# Patient Record
Sex: Male | Born: 1940 | Race: White | Hispanic: No | Smoking: Former smoker
Health system: Southern US, Community
[De-identification: ages and names within clinical notes are randomized; demographics above are authoritative.]

## PROBLEM LIST (undated history)

## (undated) DIAGNOSIS — R519 Headache, unspecified: Secondary | ICD-10-CM

## (undated) DIAGNOSIS — I509 Heart failure, unspecified: Secondary | ICD-10-CM

## (undated) DIAGNOSIS — R011 Cardiac murmur, unspecified: Secondary | ICD-10-CM

## (undated) DIAGNOSIS — E785 Hyperlipidemia, unspecified: Secondary | ICD-10-CM

## (undated) DIAGNOSIS — R062 Wheezing: Secondary | ICD-10-CM

## (undated) DIAGNOSIS — J40 Bronchitis, not specified as acute or chronic: Secondary | ICD-10-CM

## (undated) DIAGNOSIS — I10 Essential (primary) hypertension: Secondary | ICD-10-CM

## (undated) DIAGNOSIS — G4730 Sleep apnea, unspecified: Secondary | ICD-10-CM

## (undated) DIAGNOSIS — J449 Chronic obstructive pulmonary disease, unspecified: Secondary | ICD-10-CM

## (undated) DIAGNOSIS — I2510 Atherosclerotic heart disease of native coronary artery without angina pectoris: Secondary | ICD-10-CM

## (undated) DIAGNOSIS — I219 Acute myocardial infarction, unspecified: Secondary | ICD-10-CM

## (undated) DIAGNOSIS — Z8711 Personal history of peptic ulcer disease: Secondary | ICD-10-CM

## (undated) DIAGNOSIS — R51 Headache: Secondary | ICD-10-CM

## (undated) DIAGNOSIS — H259 Unspecified age-related cataract: Secondary | ICD-10-CM

## (undated) DIAGNOSIS — IMO0001 Reserved for inherently not codable concepts without codable children: Secondary | ICD-10-CM

## (undated) DIAGNOSIS — R0601 Orthopnea: Secondary | ICD-10-CM

## (undated) DIAGNOSIS — J189 Pneumonia, unspecified organism: Secondary | ICD-10-CM

## (undated) DIAGNOSIS — J984 Other disorders of lung: Secondary | ICD-10-CM

## (undated) DIAGNOSIS — K269 Duodenal ulcer, unspecified as acute or chronic, without hemorrhage or perforation: Secondary | ICD-10-CM

## (undated) DIAGNOSIS — I350 Nonrheumatic aortic (valve) stenosis: Secondary | ICD-10-CM

## (undated) DIAGNOSIS — R600 Localized edema: Secondary | ICD-10-CM

## (undated) DIAGNOSIS — M069 Rheumatoid arthritis, unspecified: Secondary | ICD-10-CM

## (undated) DIAGNOSIS — N400 Enlarged prostate without lower urinary tract symptoms: Secondary | ICD-10-CM

## (undated) HISTORY — DX: Hyperlipidemia, unspecified: E78.5

## (undated) HISTORY — DX: Pneumonia, unspecified organism: J18.9

## (undated) HISTORY — PX: LUNG SURGERY: SHX703

## (undated) HISTORY — PX: CORONARY ANGIOPLASTY WITH STENT PLACEMENT: SHX49

## (undated) HISTORY — DX: Other disorders of lung: J98.4

## (undated) HISTORY — DX: Heart failure, unspecified: I50.9

## (undated) HISTORY — DX: Rheumatoid arthritis, unspecified: M06.9

## (undated) HISTORY — DX: Chronic obstructive pulmonary disease, unspecified: J44.9

## (undated) HISTORY — DX: Atherosclerotic heart disease of native coronary artery without angina pectoris: I25.10

## (undated) HISTORY — DX: Duodenal ulcer, unspecified as acute or chronic, without hemorrhage or perforation: K26.9

## (undated) HISTORY — DX: Benign prostatic hyperplasia without lower urinary tract symptoms: N40.0

---

## 2007-07-21 ENCOUNTER — Ambulatory Visit: Payer: Self-pay | Admitting: Rheumatology

## 2008-10-07 ENCOUNTER — Ambulatory Visit: Payer: Self-pay | Admitting: Specialist

## 2008-12-13 ENCOUNTER — Inpatient Hospital Stay: Payer: Self-pay | Admitting: Internal Medicine

## 2009-04-02 ENCOUNTER — Ambulatory Visit: Payer: Self-pay | Admitting: Specialist

## 2009-07-17 ENCOUNTER — Ambulatory Visit: Payer: Self-pay | Admitting: Unknown Physician Specialty

## 2009-09-30 ENCOUNTER — Ambulatory Visit: Payer: Self-pay | Admitting: Internal Medicine

## 2009-10-07 ENCOUNTER — Ambulatory Visit: Payer: Self-pay | Admitting: Internal Medicine

## 2010-02-17 ENCOUNTER — Ambulatory Visit: Payer: Self-pay | Admitting: Internal Medicine

## 2010-04-27 ENCOUNTER — Ambulatory Visit: Payer: Self-pay | Admitting: Internal Medicine

## 2010-07-30 ENCOUNTER — Encounter: Payer: Self-pay | Admitting: Internal Medicine

## 2010-08-14 ENCOUNTER — Encounter: Payer: Self-pay | Admitting: Internal Medicine

## 2010-09-13 ENCOUNTER — Encounter: Payer: Self-pay | Admitting: Internal Medicine

## 2010-10-14 ENCOUNTER — Encounter: Payer: Self-pay | Admitting: Internal Medicine

## 2010-11-17 ENCOUNTER — Encounter: Payer: Self-pay | Admitting: Internal Medicine

## 2010-12-14 ENCOUNTER — Encounter: Payer: Self-pay | Admitting: Internal Medicine

## 2011-01-14 ENCOUNTER — Encounter: Payer: Self-pay | Admitting: Internal Medicine

## 2011-01-22 ENCOUNTER — Ambulatory Visit: Payer: Self-pay | Admitting: Internal Medicine

## 2011-02-13 ENCOUNTER — Encounter: Payer: Self-pay | Admitting: Internal Medicine

## 2012-09-18 ENCOUNTER — Ambulatory Visit: Payer: Self-pay | Admitting: Unknown Physician Specialty

## 2012-12-08 ENCOUNTER — Encounter: Payer: Self-pay | Admitting: Specialist

## 2012-12-13 ENCOUNTER — Encounter: Payer: Self-pay | Admitting: Specialist

## 2013-01-13 ENCOUNTER — Encounter: Payer: Self-pay | Admitting: Specialist

## 2013-02-12 ENCOUNTER — Encounter: Payer: Self-pay | Admitting: Specialist

## 2013-02-12 ENCOUNTER — Emergency Department: Payer: Self-pay | Admitting: Emergency Medicine

## 2013-02-12 LAB — DIFFERENTIAL
Basophil #: 0.1 10*3/uL (ref 0.0–0.1)
Eosinophil #: 0.1 10*3/uL (ref 0.0–0.7)
Eosinophil %: 0.3 %
Lymphocyte #: 1 10*3/uL (ref 1.0–3.6)
Monocyte #: 1.5 x10 3/mm — ABNORMAL HIGH (ref 0.2–1.0)
Monocyte %: 7.9 %
Neutrophil #: 16.8 10*3/uL — ABNORMAL HIGH (ref 1.4–6.5)
Neutrophil %: 86.5 %

## 2013-02-12 LAB — URINALYSIS, COMPLETE
Bilirubin,UR: NEGATIVE
Nitrite: NEGATIVE
Protein: 100
RBC,UR: 29 /HPF (ref 0–5)
Specific Gravity: 1.03 (ref 1.003–1.030)
Squamous Epithelial: NONE SEEN

## 2013-02-12 LAB — RAPID INFLUENZA A&B ANTIGENS

## 2013-02-12 LAB — APTT: Activated PTT: 33.7 secs (ref 23.6–35.9)

## 2013-02-12 LAB — BASIC METABOLIC PANEL
EGFR (African American): >60
Glucose: 132 mg/dL — ABNORMAL HIGH (ref 65–99)
Osmolality: 272 (ref 275–301)
Potassium: 4 mmol/L (ref 3.5–5.1)
Sodium: 134 mmol/L — ABNORMAL LOW (ref 136–145)

## 2013-02-12 LAB — CBC
HCT: 44.3 % (ref 40.0–52.0)
HGB: 14.2 g/dL (ref 13.0–18.0)
MCH: 24.2 pg — ABNORMAL LOW (ref 26.0–34.0)
MCV: 76 fL — ABNORMAL LOW (ref 80–100)
RBC: 5.87 10*6/uL (ref 4.40–5.90)
WBC: 19.4 10*3/uL — ABNORMAL HIGH (ref 3.8–10.6)

## 2013-02-12 LAB — TROPONIN I: Troponin-I: <0.02 ng/mL

## 2013-02-14 LAB — URINE CULTURE

## 2013-02-17 LAB — CULTURE, BLOOD (SINGLE)

## 2013-03-15 ENCOUNTER — Encounter: Payer: Self-pay | Admitting: Specialist

## 2013-04-15 ENCOUNTER — Encounter: Payer: Self-pay | Admitting: Specialist

## 2013-08-20 DIAGNOSIS — J449 Chronic obstructive pulmonary disease, unspecified: Secondary | ICD-10-CM | POA: Insufficient documentation

## 2013-08-25 DIAGNOSIS — M069 Rheumatoid arthritis, unspecified: Secondary | ICD-10-CM | POA: Insufficient documentation

## 2013-08-25 DIAGNOSIS — IMO0001 Reserved for inherently not codable concepts without codable children: Secondary | ICD-10-CM | POA: Insufficient documentation

## 2013-08-25 DIAGNOSIS — R609 Edema, unspecified: Secondary | ICD-10-CM | POA: Insufficient documentation

## 2014-05-02 DIAGNOSIS — J189 Pneumonia, unspecified organism: Secondary | ICD-10-CM

## 2014-05-02 HISTORY — DX: Pneumonia, unspecified organism: J18.9

## 2014-06-17 ENCOUNTER — Other Ambulatory Visit: Payer: Self-pay | Admitting: *Deleted

## 2014-06-17 ENCOUNTER — Encounter: Payer: Self-pay | Admitting: *Deleted

## 2014-06-17 NOTE — Patient Outreach (Signed)
[facility]) Care Management   06/17/2014  Andre Hayes Jul 25, 1940 [id number]  Andre Hayes is an 74 y.o. male  Subjective: Pt states his main problem is being able to rest at night which causes him to be extremely tired during the day. He states he feels this is mainly from having to get up to void several times at night, but admits he has not been using C-PAP as directed.  Pt states he has not started an exercise program because of not getting enough rest, the pollen effecting his breathing and he continues to get SOB from just walking from one end of the house to the other.  Pt states it has been awhile since he saw his pulmonologist. Pt stated although his O2 saturation was low he did not feel sick or that he needed to call the dr. He stated he was not more short of breath than normal.   Objective:   Filed Vitals:   06/17/14 1057  BP: 116/66  Pulse: 87  Resp: 20  Pulse oximetry on room air is 85% at rest pt stated he felt well with no increased SOB or unusual amount of cough. Does not use home O2. Review of Systems  Respiratory: Positive for cough and shortness of breath.   Genitourinary: Positive for frequency.  All other systems reviewed and are negative.   Physical Exam  Constitutional: He is oriented to person, place, and time. He appears well-developed and well-nourished.  Cardiovascular: Normal rate and regular rhythm.   Respiratory: Effort normal. He has rhonchi in the left middle field and the left lower field.  GI: Soft. Bowel sounds are normal.  Neurological: He is alert and oriented to person, place, and time.  Skin: Skin is warm and dry.  Psychiatric: He has a normal mood and affect.    Current Medications:   Current Outpatient Prescriptions  Medication Sig Dispense Refill  . albuterol (PROVENTIL HFA;VENTOLIN HFA) 108 (90 BASE) MCG/ACT inhaler Inhale 2 puffs into the lungs every 4 (four) hours as needed for wheezing or shortness of breath  (pt states he takes daily).    Marland Kitchen aspirin 325 MG tablet Take 325 mg by mouth daily.    . clopidogrel (PLAVIX) 75 MG tablet Take 75 mg by mouth daily.    Marland Kitchen docusate sodium (COLACE) 100 MG capsule Take 100 mg by mouth daily.    . folic acid (FOLVITE) 400 MCG tablet Take 400 mcg by mouth daily.    . furosemide (LASIX) 10 MG/ML solution Take 10 mg by mouth daily.    . hydroxychloroquine (PLAQUENIL) 200 MG tablet Take 200 mg by mouth daily.    Marland Kitchen ipratropium-albuterol (DUONEB) 0.5-2.5 (3) MG/3ML SOLN Take 3 mLs by nebulization every 4 (four) hours as needed (takes once a day).    . simvastatin (ZOCOR) 40 MG tablet Take 40 mg by mouth daily.    . tamsulosin (FLOMAX) 0.4 MG CAPS capsule Take 0.4 mg by mouth daily. Pt is to take 3 capsules daily    . UNABLE TO FIND Take 1 Dose by nebulization daily. Med Name: [employer]- pt is in a clinical trial of this medication for COPD     No current facility-administered medications for this visit.    Functional Status:   In your present state of health, do you have any difficulty performing the following activities: 06/17/2014  Is the patient deaf or have difficulty hearing? N  Hearing N  Vision N  Difficulty concentrating or making  decisions N  Walking or climbing stairs? N  Doing errands, shopping? N  Preparing Food and eating ? N  Using the Toilet? N  In the past six months, have you accidently leaked urine? N  Do you have problems with loss of bowel control? N  Managing your Medications? N  Managing your Finances? N  Housekeeping or managing your Housekeeping? N    Fall/Depression Screening:    PHQ 2/9 Scores 06/17/2014  PHQ - 2 Score 0  Exception Documentation Other- indicate reason in comment box  Not completed pt states no feelings of depression   THN CM Care Plan        Patient Outreach from 06/17/2014 in [facility]   Care Plan Problem One  Activity intolerance related to COPD   Care Plan for Problem One  Active    Interventions for Problem One Long Term Goal  Discussed the importance of exercise.    THN Long Term Goal (31-90 days)  Within 90 days be able to go to the gym and exercise   [facility]N Long Term Goal Start Date  05/17/14   [facility] CM Short Term Goal #1 (0-30 days)  Increase exercise to 2 times per week in the next 30 days   [facility]N CM Short Term Goal #1 Start Date  06/17/14   [facility]N CM Short Term Goal #2 (0-30 days)  Pt to increase use of cpap machine to every night in the next 30 days.   [facility]N CM Short Term Goal #2 Start Date  06/17/14     Assessment: Pt alert and oriented, going to the dentist after [facility] appointment so did not have much time for home visit. Pt O2 sat at 85% at rest on room air. Pt reported he had been affected by the pollen, but was otherwise feeling well. He did not feel it necessary for him to call the MD about low O2 saturation at this time. Lungs with small amount of ronchi heard in the left middle and lower lobes, no wheezing noted. Pt stated he was not any more short of breath than normal and was continuing to take all medications as prescribed. Pt had not started exercising since previous visit, but plans to now that the weather is better. RNCM and pt had a discussion about how wearing prescribed CPAP could potentially assist him in sleeping better and feeling more rested during the day.     Plan:  RNCM called MD Anderson's office and left a message with Thayer Ohm a member of his office staff to inform MD of pt's low 02 saturation reading, but no other signs of symptoms of pt feeling unwell today.  RNCM printed and gave pt EMMI information related to enlarged prostate and frequent urination per pt request.  RNCM made appointment to see pt on 5/4   Sterling Ucci RN, [name]  [facility] Care Management [phone])

## 2014-06-17 NOTE — Patient Instructions (Signed)

## 2014-06-18 ENCOUNTER — Encounter: Payer: Self-pay | Admitting: *Deleted

## 2014-06-18 NOTE — Patient Outreach (Signed)
[facility]) Care Management  06/18/2014  Andre Hayes [facility] Nov 23, 1940 [id number]   Call received from [facility] MD's office nurse related to the information given yesterday about pt's low O2 saturation of 85% on room air while at rest. Nurse informed of pt stating he felt well and was no more short of breath than normal. Also informed nurse pt denied productive cough. All other vital signs wnl.  Informed nurse of rhonchi noted to pt's left middle and lower lung fields. Nurse acknowledged information and stated she would inform Andre Piano MD.   Andre Rings [name], [name]  [facility] Care Management [phone])

## 2014-06-21 ENCOUNTER — Encounter: Payer: Self-pay | Admitting: *Deleted

## 2014-06-21 ENCOUNTER — Other Ambulatory Visit: Payer: Self-pay | Admitting: *Deleted

## 2014-06-21 NOTE — Patient Outreach (Signed)
[facility] [place]) Care Management  06/21/2014  Jassen Sarver [facility] March 07, 1941 [id number]  Information requested by [facility] related to pt's last recorded room air saturation. Pt aware of the request and gave verbal consent to fax information to [employer]. Information scanned into [employer] and routed to [place] via [employer].  Costella Hatcher RN, [name]  [facility] Care Management [phone])

## 2014-06-21 NOTE — Patient Outreach (Signed)
Assessment: Received a call from patient in regards to information needed from his oxygen supply company about his most recent O2 saturation. Pt gave verbal permission to send this information to Roy at [employer] to assist with the pt obtaining home oxygen therapy.  Also talked with patient about using CPAP at night, pt stated he was going to call the [place] company with questions related to proper use of CPAP.  Plan: Fax required information to [employer] attn: French Ana Plan: Find and send patient information on properly wearing CPAP machine. Costella Hatcher RN, [name]  [facility] Care Management [phone])

## 2014-06-27 ENCOUNTER — Other Ambulatory Visit: Payer: Self-pay | Admitting: *Deleted

## 2014-06-27 NOTE — Patient Outreach (Signed)
RNCM contacted pt requested information concerning reordering CPAP supplies and a new nebulizer machine. Pt stated he felt comfortable contacting his pulmonologist to request a prescription/order be sent. Pt also stated he felt comfortable to call the supply company to reorder CPAP supplies.    Plan: Pt is to call RNCM back if he runs into any roadblocks in the process.

## 2014-06-27 NOTE — Patient Outreach (Signed)
RNCM spoke to customer representative at [facility] [employer] to inquire about the process for the pt to obtain new respiratory equipment. She gave me information to relay to the patient, including a phone number directly dealing with C-PAP supplies.

## 2014-06-27 NOTE — Patient Outreach (Signed)
Pt called RNCM and requested information about obtaining a new nebulizer machine and supplies for his C-PAP machine. He also stated he had received his oxygen supplies. Pt stated he had seen his primary care MD and his O2 saturation was consistently at 85% but improved on the O2 to 94%.   Plan: Call [employer] to inquire about the process of obtaining supplies and a new nebulizer machine.  Plan:  Call pt back with information.

## 2014-07-09 ENCOUNTER — Inpatient Hospital Stay
Admit: 2014-07-09 | Discharge: 2014-07-19 | DRG: 208 | Disposition: A | Discharge status: Skilled Nursing Facility | Payer: Commercial Managed Care - HMO | Source: Ambulatory Visit | Attending: Internal Medicine | Admitting: Internal Medicine

## 2014-07-09 DIAGNOSIS — E785 Hyperlipidemia, unspecified: Secondary | ICD-10-CM | POA: Diagnosis present

## 2014-07-09 DIAGNOSIS — N400 Enlarged prostate without lower urinary tract symptoms: Secondary | ICD-10-CM | POA: Diagnosis present

## 2014-07-09 DIAGNOSIS — Z955 Presence of coronary angioplasty implant and graft: Secondary | ICD-10-CM | POA: Diagnosis not present

## 2014-07-09 DIAGNOSIS — Z7902 Long term (current) use of antithrombotics/antiplatelets: Secondary | ICD-10-CM

## 2014-07-09 DIAGNOSIS — R1310 Dysphagia, unspecified: Secondary | ICD-10-CM | POA: Diagnosis present

## 2014-07-09 DIAGNOSIS — I2510 Atherosclerotic heart disease of native coronary artery without angina pectoris: Secondary | ICD-10-CM | POA: Diagnosis present

## 2014-07-09 DIAGNOSIS — G4733 Obstructive sleep apnea (adult) (pediatric): Secondary | ICD-10-CM | POA: Diagnosis present

## 2014-07-09 DIAGNOSIS — I480 Paroxysmal atrial fibrillation: Secondary | ICD-10-CM | POA: Diagnosis not present

## 2014-07-09 DIAGNOSIS — J441 Chronic obstructive pulmonary disease with (acute) exacerbation: Secondary | ICD-10-CM | POA: Diagnosis not present

## 2014-07-09 DIAGNOSIS — J9600 Acute respiratory failure, unspecified whether with hypoxia or hypercapnia: Secondary | ICD-10-CM | POA: Diagnosis present

## 2014-07-09 DIAGNOSIS — Z87891 Personal history of nicotine dependence: Secondary | ICD-10-CM

## 2014-07-09 DIAGNOSIS — J9601 Acute respiratory failure with hypoxia: Principal | ICD-10-CM | POA: Diagnosis present

## 2014-07-09 DIAGNOSIS — Z7982 Long term (current) use of aspirin: Secondary | ICD-10-CM

## 2014-07-09 DIAGNOSIS — Z8249 Family history of ischemic heart disease and other diseases of the circulatory system: Secondary | ICD-10-CM

## 2014-07-09 DIAGNOSIS — I5033 Acute on chronic diastolic (congestive) heart failure: Secondary | ICD-10-CM | POA: Diagnosis not present

## 2014-07-09 DIAGNOSIS — Z79899 Other long term (current) drug therapy: Secondary | ICD-10-CM | POA: Diagnosis not present

## 2014-07-09 DIAGNOSIS — Z881 Allergy status to other antibiotic agents status: Secondary | ICD-10-CM

## 2014-07-09 DIAGNOSIS — M069 Rheumatoid arthritis, unspecified: Secondary | ICD-10-CM | POA: Diagnosis present

## 2014-07-09 DIAGNOSIS — Z9989 Dependence on other enabling machines and devices: Secondary | ICD-10-CM

## 2014-07-09 DIAGNOSIS — R0602 Shortness of breath: Secondary | ICD-10-CM | POA: Diagnosis present

## 2014-07-09 LAB — URINALYSIS, COMPLETE
Bacteria: NONE SEEN
Bilirubin,UR: NEGATIVE
Glucose,UR: NEGATIVE mg/dL (ref 0–75)
Ketone: NEGATIVE
Leukocyte Esterase: NEGATIVE
Nitrite: NEGATIVE
PROTEIN: NEGATIVE
Ph: 6 (ref 4.5–8.0)
Specific Gravity: 1.009 (ref 1.003–1.030)

## 2014-07-09 LAB — COMPREHENSIVE METABOLIC PANEL
ALBUMIN: 3.9 g/dL
ALT: 23 U/L
ANION GAP: 4 — AB (ref 7–16)
AST: 26 U/L
Alkaline Phosphatase: 76 U/L
BUN: 22 mg/dL — ABNORMAL HIGH
Bilirubin,Total: 0.9 mg/dL
CALCIUM: 9 mg/dL
CO2: 38 mmol/L — AB
Chloride: 102 mmol/L
Creatinine: 0.86 mg/dL
EGFR (African American): >60
Glucose: 135 mg/dL — ABNORMAL HIGH
POTASSIUM: 4 mmol/L
Sodium: 144 mmol/L
Total Protein: 7.3 g/dL

## 2014-07-09 LAB — CBC
HCT: 49.7 % (ref 40.0–52.0)
HGB: 15.2 g/dL (ref 13.0–18.0)
MCH: 24.3 pg — ABNORMAL LOW (ref 26.0–34.0)
MCHC: 30.6 g/dL — ABNORMAL LOW (ref 32.0–36.0)
MCV: 79 fL — ABNORMAL LOW (ref 80–100)
Platelet: 180 10*3/uL (ref 150–440)
RBC: 6.26 10*6/uL — AB (ref 4.40–5.90)
RDW: 15.4 % — ABNORMAL HIGH (ref 11.5–14.5)
WBC: 9.8 10*3/uL (ref 3.8–10.6)

## 2014-07-09 LAB — PRO B NATRIURETIC PEPTIDE: B-TYPE NATIURETIC PEPTID: 73 pg/mL

## 2014-07-09 LAB — TROPONIN I: Troponin-I: <0.03 ng/mL

## 2014-07-10 LAB — BASIC METABOLIC PANEL
Anion Gap: 8 (ref 7–16)
BUN: 24 mg/dL — ABNORMAL HIGH
CHLORIDE: 94 mmol/L — AB
CO2: 39 mmol/L — AB
Calcium, Total: 9 mg/dL
Creatinine: 1.07 mg/dL
EGFR (African American): >60
EGFR (Non-African Amer.): >60
GLUCOSE: 109 mg/dL — AB
POTASSIUM: 4.6 mmol/L
Sodium: 141 mmol/L

## 2014-07-11 LAB — BASIC METABOLIC PANEL
Anion Gap: 8 (ref 7–16)
BUN: 34 mg/dL — ABNORMAL HIGH
CHLORIDE: 96 mmol/L — AB
CO2: 35 mmol/L — AB
CREATININE: 1.02 mg/dL
Calcium, Total: 9 mg/dL
EGFR (African American): >60
EGFR (Non-African Amer.): >60
GLUCOSE: 104 mg/dL — AB
Potassium: 3.5 mmol/L
Sodium: 139 mmol/L

## 2014-07-12 LAB — CBC WITH DIFFERENTIAL/PLATELET
Basophil #: 0 10*3/uL (ref 0.0–0.1)
Basophil %: 0.3 %
EOS ABS: 0.1 10*3/uL (ref 0.0–0.7)
EOS PCT: 0.5 %
HCT: 49.6 % (ref 40.0–52.0)
HGB: 15.3 g/dL (ref 13.0–18.0)
Lymphocyte #: 0.6 10*3/uL — ABNORMAL LOW (ref 1.0–3.6)
Lymphocyte %: 5.3 %
MCH: 24 pg — ABNORMAL LOW (ref 26.0–34.0)
MCHC: 30.9 g/dL — ABNORMAL LOW (ref 32.0–36.0)
MCV: 78 fL — AB (ref 80–100)
Monocyte #: 1.1 x10 3/mm — ABNORMAL HIGH (ref 0.2–1.0)
Monocyte %: 8.7 %
NEUTROS PCT: 85.2 %
Neutrophil #: 10.4 10*3/uL — ABNORMAL HIGH (ref 1.4–6.5)
PLATELETS: 176 10*3/uL (ref 150–440)
RBC: 6.39 10*6/uL — AB (ref 4.40–5.90)
RDW: 15 % — AB (ref 11.5–14.5)
WBC: 12.2 10*3/uL — AB (ref 3.8–10.6)

## 2014-07-12 LAB — BASIC METABOLIC PANEL
ANION GAP: 9 (ref 7–16)
BUN: 34 mg/dL — ABNORMAL HIGH
CALCIUM: 8.9 mg/dL
CHLORIDE: 100 mmol/L — AB
Co2: 29 mmol/L
Creatinine: 0.94 mg/dL
EGFR (African American): >60
Glucose: 99 mg/dL
POTASSIUM: 3.6 mmol/L
Sodium: 138 mmol/L

## 2014-07-12 LAB — MAGNESIUM: MAGNESIUM: 2.3 mg/dL

## 2014-07-12 LAB — PHOSPHORUS: PHOSPHORUS: 4.8 mg/dL — AB

## 2014-07-13 DIAGNOSIS — J9600 Acute respiratory failure, unspecified whether with hypoxia or hypercapnia: Secondary | ICD-10-CM | POA: Diagnosis present

## 2014-07-13 LAB — CBC WITH DIFFERENTIAL/PLATELET
BASOS PCT: 0.4 %
Basophil #: 0 10*3/uL (ref 0.0–0.1)
EOS ABS: 0.1 10*3/uL (ref 0.0–0.7)
EOS PCT: 0.8 %
HCT: 47.4 % (ref 40.0–52.0)
HGB: 15.2 g/dL (ref 13.0–18.0)
LYMPHS PCT: 13.8 %
Lymphocyte #: 1.4 10*3/uL (ref 1.0–3.6)
MCH: 24.7 pg — ABNORMAL LOW (ref 26.0–34.0)
MCHC: 32.1 g/dL (ref 32.0–36.0)
MCV: 77 fL — AB (ref 80–100)
Monocyte #: 1.1 x10 3/mm — ABNORMAL HIGH (ref 0.2–1.0)
Monocyte %: 10.3 %
Neutrophil #: 7.8 10*3/uL — ABNORMAL HIGH (ref 1.4–6.5)
Neutrophil %: 74.7 %
PLATELETS: 162 10*3/uL (ref 150–440)
RBC: 6.16 10*6/uL — AB (ref 4.40–5.90)
RDW: 15.2 % — ABNORMAL HIGH (ref 11.5–14.5)
WBC: 10.4 10*3/uL (ref 3.8–10.6)

## 2014-07-13 LAB — BASIC METABOLIC PANEL
ANION GAP: 6 — AB (ref 7–16)
BUN: 30 mg/dL — AB
CALCIUM: 8.8 mg/dL — AB
Chloride: 103 mmol/L
Co2: 26 mmol/L
Creatinine: 0.83 mg/dL
EGFR (African American): >60
Glucose: 120 mg/dL — ABNORMAL HIGH
Potassium: 3.5 mmol/L
Sodium: 135 mmol/L

## 2014-07-13 LAB — MAGNESIUM: Magnesium: 2.2 mg/dL

## 2014-07-13 LAB — PHOSPHORUS: Phosphorus: 3 mg/dL

## 2014-07-13 MED ORDER — ASPIRIN EC 81 MG PO TBEC
81.0000 mg | DELAYED_RELEASE_TABLET | Freq: Every day | ORAL | Status: DC
  Administered 2014-07-14 – 2014-07-19 (×6): 81 mg via ORAL
  Filled 2014-07-13 (×6): qty 1

## 2014-07-13 MED ORDER — ACETAMINOPHEN 325 MG PO TABS: 650.0000 mg | ORAL_TABLET | ORAL | Status: DC | PRN

## 2014-07-13 MED ORDER — SODIUM CHLORIDE 0.9 % IJ SOLN
3.0000 mL | INTRAMUSCULAR | Status: DC | PRN
  Administered 2014-07-16 – 2014-07-19 (×5): 3 mL via INTRAVENOUS
  Filled 2014-07-13 (×5): qty 10

## 2014-07-13 MED ORDER — ONDANSETRON HCL 4 MG/2ML IJ SOLN: 4.0000 mg | INTRAMUSCULAR | Status: DC | PRN

## 2014-07-13 MED ORDER — FAMOTIDINE 20 MG PO TABS
20.0000 mg | ORAL_TABLET | Freq: Two times a day (BID) | ORAL | Status: DC
  Administered 2014-07-14 – 2014-07-19 (×8): 20 mg via ORAL
  Filled 2014-07-13 (×9): qty 1

## 2014-07-13 MED ORDER — ENOXAPARIN SODIUM 40 MG/0.4ML ~~LOC~~ SOLN
40.0000 mg | SUBCUTANEOUS | Status: DC
  Administered 2014-07-14 – 2014-07-17 (×3): 40 mg via SUBCUTANEOUS
  Filled 2014-07-13 (×4): qty 0.4

## 2014-07-13 MED ORDER — TAMSULOSIN HCL 0.4 MG PO CAPS
0.4000 mg | ORAL_CAPSULE | Freq: Every day | ORAL | Status: DC
  Administered 2014-07-16 – 2014-07-17 (×2): 0.4 mg via ORAL
  Filled 2014-07-13 (×3): qty 1

## 2014-07-13 MED ORDER — ALPRAZOLAM 0.5 MG PO TABS
0.5000 mg | ORAL_TABLET | Freq: Three times a day (TID) | ORAL | Status: DC
  Administered 2014-07-14 – 2014-07-16 (×4): 0.5 mg via ORAL
  Filled 2014-07-13 (×4): qty 1

## 2014-07-13 MED ORDER — HYDROXYCHLOROQUINE SULFATE 200 MG PO TABS
200.0000 mg | ORAL_TABLET | Freq: Two times a day (BID) | ORAL | Status: DC
  Administered 2014-07-14: 200 mg via ORAL
  Filled 2014-07-13: qty 1

## 2014-07-13 MED ORDER — PNEUMOCOCCAL VAC POLYVALENT 25 MCG/0.5ML IJ INJ: 0.5000 mL | INJECTION | INTRAMUSCULAR | Status: DC | PRN

## 2014-07-13 MED ORDER — FOLIC ACID 1 MG PO TABS
1.0000 mg | ORAL_TABLET | Freq: Every day | ORAL | Status: DC
  Administered 2014-07-14 – 2014-07-19 (×6): 1 mg via ORAL
  Filled 2014-07-13 (×6): qty 1

## 2014-07-13 MED ORDER — QUETIAPINE FUMARATE 25 MG PO TABS
25.0000 mg | ORAL_TABLET | Freq: Every day | ORAL | Status: DC
  Administered 2014-07-16 – 2014-07-17 (×2): 25 mg via ORAL
  Filled 2014-07-13 (×3): qty 1

## 2014-07-13 MED ORDER — METHYLPREDNISOLONE SODIUM SUCC 40 MG IJ SOLR
40.0000 mg | INTRAMUSCULAR | Status: DC
  Administered 2014-07-14 – 2014-07-18 (×5): 40 mg via INTRAVENOUS
  Filled 2014-07-13 (×5): qty 1

## 2014-07-13 MED ORDER — IPRATROPIUM-ALBUTEROL 0.5-2.5 (3) MG/3ML IN SOLN
3.0000 mL | RESPIRATORY_TRACT | Status: DC
  Administered 2014-07-14 (×6): 3 mL via RESPIRATORY_TRACT
  Filled 2014-07-13 (×4): qty 3

## 2014-07-13 MED ORDER — PIPERACILLIN-TAZOBACTAM 3.375 G IVPB
3.3750 g | Freq: Three times a day (TID) | INTRAVENOUS | Status: DC
  Administered 2014-07-14 – 2014-07-19 (×15): 3.375 g via INTRAVENOUS
  Filled 2014-07-13 (×19): qty 50

## 2014-07-13 MED ORDER — CHLORHEXIDINE GLUCONATE 0.12 % MT SOLN: 5.0000 mL | Freq: Two times a day (BID) | OROMUCOSAL | Status: DC

## 2014-07-13 MED ORDER — INSULIN ASPART 100 UNIT/ML ~~LOC~~ SOLN
1.0000 [IU] | Freq: Four times a day (QID) | SUBCUTANEOUS | Status: DC
  Administered 2014-07-15: 4 [IU] via SUBCUTANEOUS
  Administered 2014-07-16 – 2014-07-17 (×5): 2 [IU] via SUBCUTANEOUS
  Filled 2014-07-13 (×3): qty 2
  Filled 2014-07-13: qty 4
  Filled 2014-07-13: qty 3
  Filled 2014-07-13 (×2): qty 2

## 2014-07-13 MED ORDER — BUDESONIDE 0.5 MG/2ML IN SUSP
0.5000 mg | Freq: Two times a day (BID) | RESPIRATORY_TRACT | Status: DC
  Administered 2014-07-14 – 2014-07-18 (×9): 0.5 mg via RESPIRATORY_TRACT
  Filled 2014-07-13 (×9): qty 2

## 2014-07-13 MED ORDER — LORAZEPAM 2 MG/ML IJ SOLN
1.0000 mg | INTRAMUSCULAR | Status: DC | PRN
  Administered 2014-07-14 – 2014-07-15 (×2): 2 mg via INTRAVENOUS
  Filled 2014-07-13: qty 2

## 2014-07-13 MED ORDER — FENTANYL 2500MCG IN NS 250ML (10MCG/ML) PREMIX INFUSION: 0.0000 ug/h | INTRAVENOUS | Status: DC

## 2014-07-13 MED ORDER — PROPOFOL 1000 MG/100ML IV EMUL: 5.0000 ug/kg/min | INTRAVENOUS | Status: DC

## 2014-07-13 MED ORDER — DOCUSATE SODIUM 50 MG/5ML PO LIQD
50.0000 mg | Freq: Two times a day (BID) | ORAL | Status: DC
  Administered 2014-07-14: 100 mg via ORAL
  Administered 2014-07-15 – 2014-07-19 (×5): 50 mg via ORAL
  Filled 2014-07-13 (×11): qty 10

## 2014-07-13 MED ORDER — SENNOSIDES 8.8 MG/5ML PO SYRP
5.0000 mL | ORAL_SOLUTION | Freq: Two times a day (BID) | ORAL | Status: DC
  Administered 2014-07-14: 1 mL via ORAL
  Administered 2014-07-15 – 2014-07-19 (×4): 5 mL via ORAL
  Filled 2014-07-13 (×11): qty 5

## 2014-07-13 MED ORDER — CLOPIDOGREL BISULFATE 75 MG PO TABS
75.0000 mg | ORAL_TABLET | Freq: Every day | ORAL | Status: DC
  Administered 2014-07-14: 75 mg via ORAL
  Filled 2014-07-13: qty 1

## 2014-07-13 MED ORDER — SODIUM CHLORIDE 0.9 % IJ SOLN: 3.0000 mL | INTRAMUSCULAR | Status: DC | PRN

## 2014-07-13 MED ORDER — VITAL HIGH PROTEIN PO LIQD: 1000.0000 mL | ORAL | Status: DC

## 2014-07-13 MED ORDER — PRO-STAT SUGAR FREE PO LIQD: 60.0000 mL | Freq: Three times a day (TID) | ORAL | Status: DC

## 2014-07-13 MED ORDER — POTASSIUM CHLORIDE CRYS ER 20 MEQ PO TBCR
20.0000 meq | EXTENDED_RELEASE_TABLET | Freq: Every day | ORAL | Status: DC
  Administered 2014-07-14 – 2014-07-19 (×6): 20 meq via ORAL
  Filled 2014-07-13 (×6): qty 1

## 2014-07-14 DIAGNOSIS — J9601 Acute respiratory failure with hypoxia: Principal | ICD-10-CM

## 2014-07-14 DIAGNOSIS — I5033 Acute on chronic diastolic (congestive) heart failure: Secondary | ICD-10-CM | POA: Insufficient documentation

## 2014-07-14 DIAGNOSIS — J441 Chronic obstructive pulmonary disease with (acute) exacerbation: Secondary | ICD-10-CM | POA: Insufficient documentation

## 2014-07-14 DIAGNOSIS — Z9989 Dependence on other enabling machines and devices: Secondary | ICD-10-CM

## 2014-07-14 DIAGNOSIS — G4733 Obstructive sleep apnea (adult) (pediatric): Secondary | ICD-10-CM | POA: Insufficient documentation

## 2014-07-14 LAB — BASIC METABOLIC PANEL
Anion gap: 5 (ref 5–15)
BUN: 28 mg/dL — ABNORMAL HIGH (ref 6–20)
CO2: 30 mmol/L (ref 22–32)
Calcium: 9 mg/dL (ref 8.9–10.3)
Chloride: 105 mmol/L (ref 101–111)
Creatinine, Ser: 0.84 mg/dL (ref 0.61–1.24)
GFR calc Af Amer: >60 mL/min (ref >60)
GFR calc non Af Amer: >60 mL/min (ref >60)
Glucose, Bld: 93 mg/dL (ref 65–99)
Potassium: 3.8 mmol/L (ref 3.5–5.1)
Sodium: 140 mmol/L (ref 135–145)

## 2014-07-14 LAB — GLUCOSE, CAPILLARY
GLUCOSE-CAPILLARY: 79 mg/dL (ref 70–99)
GLUCOSE-CAPILLARY: 90 mg/dL (ref 70–99)
GLUCOSE-CAPILLARY: 112 mg/dL — AB (ref 70–99)
GLUCOSE-CAPILLARY: 105 mg/dL — AB (ref 70–99)
Glucose-Capillary: 77 mg/dL (ref 70–99)
Glucose-Capillary: 125 mg/dL — ABNORMAL HIGH (ref 70–99)

## 2014-07-14 LAB — MAGNESIUM: Magnesium: 2.3 mg/dL (ref 1.7–2.4)

## 2014-07-14 LAB — PHOSPHORUS: Phosphorus: 2.7 mg/dL (ref 2.5–4.6)

## 2014-07-14 MED ORDER — ALBUTEROL SULFATE HFA 108 (90 BASE) MCG/ACT IN AERS: 1.0000 | INHALATION_SPRAY | Freq: Four times a day (QID) | RESPIRATORY_TRACT | Status: DC | PRN

## 2014-07-14 MED ORDER — TAMSULOSIN HCL 0.4 MG PO CAPS: 0.4000 mg | ORAL_CAPSULE | Freq: Four times a day (QID) | ORAL | Status: DC

## 2014-07-14 MED ORDER — HYDROXYCHLOROQUINE SULFATE 200 MG PO TABS
200.0000 mg | ORAL_TABLET | Freq: Two times a day (BID) | ORAL | Status: DC
  Administered 2014-07-15 – 2014-07-19 (×7): 200 mg via ORAL
  Filled 2014-07-14 (×8): qty 1

## 2014-07-14 MED ORDER — FUROSEMIDE 20 MG PO TABS
20.0000 mg | ORAL_TABLET | Freq: Every day | ORAL | Status: DC
  Administered 2014-07-15 – 2014-07-19 (×5): 20 mg via ORAL
  Filled 2014-07-14 (×5): qty 1

## 2014-07-14 MED ORDER — FOLIC ACID 1 MG PO TABS: 1.0000 mg | ORAL_TABLET | Freq: Every day | ORAL | Status: DC

## 2014-07-14 MED ORDER — ALBUTEROL SULFATE (2.5 MG/3ML) 0.083% IN NEBU: 2.5000 mg | INHALATION_SOLUTION | RESPIRATORY_TRACT | Status: DC | PRN

## 2014-07-14 MED ORDER — DOCUSATE SODIUM 100 MG PO CAPS
100.0000 mg | ORAL_CAPSULE | Freq: Every day | ORAL | Status: DC | PRN
  Administered 2014-07-16: 100 mg via ORAL
  Filled 2014-07-14: qty 1

## 2014-07-14 MED ORDER — ALBUTEROL SULFATE (2.5 MG/3ML) 0.083% IN NEBU: 2.5000 mg | INHALATION_SOLUTION | Freq: Four times a day (QID) | RESPIRATORY_TRACT | Status: DC | PRN

## 2014-07-14 MED ORDER — CLOPIDOGREL BISULFATE 75 MG PO TABS: 75.0000 mg | ORAL_TABLET | Freq: Every day | ORAL | Status: DC

## 2014-07-14 MED ORDER — IPRATROPIUM-ALBUTEROL 0.5-2.5 (3) MG/3ML IN SOLN
3.0000 mL | Freq: Four times a day (QID) | RESPIRATORY_TRACT | Status: DC
  Administered 2014-07-15 – 2014-07-19 (×16): 3 mL via RESPIRATORY_TRACT
  Filled 2014-07-14 (×17): qty 3

## 2014-07-14 MED ORDER — ASPIRIN EC 81 MG PO TBEC: 81.0000 mg | DELAYED_RELEASE_TABLET | Freq: Every day | ORAL | Status: DC

## 2014-07-14 MED ORDER — CLOPIDOGREL BISULFATE 75 MG PO TABS
75.0000 mg | ORAL_TABLET | Freq: Every day | ORAL | Status: DC
  Administered 2014-07-15 – 2014-07-19 (×5): 75 mg via ORAL
  Filled 2014-07-14 (×5): qty 1

## 2014-07-14 NOTE — Progress Notes (Signed)
Patient transferred from ccu. Patient confused place, time and situation. Voice soft hard to hear. Currently on o2 at 2l. Iv saline locked left hand. scd and ted intacted. Telemetry reading nsr 78 elevated t wave. High fall risk, bed alarm in use side rails up x2.

## 2014-07-14 NOTE — Outcomes Assessment (Signed)
Patient Andre Hayes with 1 degree HB on cardiac monitor. 2L n/c, SATs WNL. NPO pending swallow evaluation. PIV x 2, SL. Foley catheter with 650 ml UOP this shift. FSQ6 with scheduled coverage. Patient son, Chidera, at bedside, updated on patient status and POC. Son updated on patient transfer. Report called to [employer], Charity fundraiser. Patient transferred to rm 232 in bed with portable telemetry applied with O2 at 2L.

## 2014-07-14 NOTE — Progress Notes (Signed)
PULMONARY / CRITICAL CARE MEDICINE   Name: Andre Hayes MRN: [id number] DOB: 12/09/1940    ADMISSION DATE:  07/09/2014  SIGNIFICANT EVENTS: 07/13/14 - Extubated, placed on intermittent bipap   SUBJECTIVE:  74 yo white male admitted to ICU for hypoxia, patient had been doing well in ICU during day but had acute hypoxia and increased WOB 4/28 and was emergently intubated. patient with h/o severe CAD with CHF, patient now intubated, on sedation vacation and SBT trial. patient also with h/o COPD, extubated on 07/13/14, placed on bipap overnight, required small dose of ativan for anxiety while on bipap, mildly somnolent this morning, but following commands.   \VITAL SIGNS: Temp:  [98.7 F (37.1 C)-99 F (37.2 C)] 98.7 F (37.1 C) (05/01 0800) Pulse Rate:  [73-88] 80 (05/01 0800) Resp:  [18-28] 25 (05/01 0800) BP: (87-139)/(51-118) 111/51 mmHg (05/01 0800) SpO2:  [92 %-100 %] 93 % (05/01 0930) FiO2 (%):  [30 %] 30 % (05/01 0700) HEMODYNAMICS:   VENTILATOR SETTINGS: Vent Mode:  [-]  FiO2 (%):  [30 %] 30 % INTAKE / OUTPUT:  Intake/Output Summary (Last 24 hours) at 07/14/14 1032 Last data filed at 07/14/14 0400  Gross per 24 hour  Intake     50 ml  Output      0 ml  Net     50 ml    PHYSICAL EXAMINATION:  GEN Awake, but somnolent at times.    HEENT pale conjunctivae, PERRL, [place]@2L    NECK supple  No masses   CARD regular rate  no murmur   ABD denies tenderness  soft  no Abdominal Bruits   GU foley catheter in place   LYMPH negative neck, negative axillae   EXTR negative cyanosis/clubbing, positive edema   SKIN normal to palpation, No rashes, No ulcers   NEURO Awake following, commands.    PSYCH sedated, moderately somnolent, following simple commands   RESP Good breath sounds, shallow at the bases.     LABS:  CBC  Recent Labs Lab 07/09/14 0914 07/12/14 1109 07/13/14 0632  WBC 9.8 12.2* 10.4  HGB 15.2 15.3 15.2  HCT 49.7 49.6 47.4  PLT 180 176 162    Coag's No results for input(s): APTT, INR in the last 168 hours. BMET  Recent Labs Lab 07/12/14 0559 07/13/14 0632 07/14/14 0425  NA  --   --  140  K  --   --  3.8  CL  --   --  105  CO2 29 26 30   BUN 34* 30* 28*  CREATININE 0.94 0.83 0.84  GLUCOSE  --   --  93   Electrolytes  Recent Labs Lab 07/12/14 0559 07/13/14 0632 07/14/14 0425  CALCIUM 8.9 8.8* 9.0  MG  --   --  2.3  PHOS  --   --  2.7   Sepsis Markers No results for input(s): LATICACIDVEN, PROCALCITON, O2SATVEN in the last 168 hours. ABG No results for input(s): PHART, PCO2ART, PO2ART in the last 168 hours. Liver Enzymes  Recent Labs Lab 07/09/14 0914  AST 26  ALT 23  ALKPHOS 76  ALBUMIN 3.9   Cardiac Enzymes No results for input(s): TROPONINI, PROBNP in the last 168 hours. Glucose  Recent Labs Lab 07/13/14 2320 07/14/14 0607  GLUCAP 79  77 90    Imaging No results found.   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  - cpap/bipap nightly - preferably bipap since patient has both copd and dCHF   Stephanie Acre, MD [place] [facility] Pager [phone] (Please enter 7-digits)

## 2014-07-14 NOTE — Plan of Care (Signed)
Problem: Acute Rehab PT Goals(only PT should resolve) Goal: Patient Will Transfer Sit To/From Stand On LRAD Goal: Pt Will Transfer Bed To Chair/Chair To Bed With LRAD

## 2014-07-14 NOTE — H&P (Signed)
PATIENT NAME:  Andre Hayes, Andre Hayes MR#:  [id number] DATE OF BIRTH:  09-01-1940  DATE OF ADMISSION:  07/09/2014  PRIMARY CARE PHYSICIAN: Einar Crow, MD   CARDIOLOGIST: Arnoldo Hooker, MD  CHIEF COMPLAINT: Shortness of breath, lower extremity edema.   HISTORY OF PRESENT ILLNESS: A 74 year old Caucasian male patient with history of CAD status post PCI and COPD who presents to the Emergency Room complaining of progressively worsening shortness of breath over 3 days. The patient mentions that he has had chronically worsening shortness of breath, but over the last 3 days this has come to a point where he can barely breathe at rest, unable to speak. Here in the Emergency Room, he has been found to have sats of 60% on room air. He has also had orthopnea and lower extremity edema which is worsening. He also has clear productive sputum. No chest pain. His Lasix was decreased to half a dose 2 months prior by his PCP, although the patient is not aware why this was decreased.   Here the patient is needing BiPAP support. A chest x-ray shows CHF and is being admitted for acute respiratory failure.   PAST MEDICAL HISTORY: 1.  Rheumatoid arthritis.  2.  BPH. 3.  CAD with drug eluting stent to RCA in 2011.  4.  Hyperlipidemia.  5.  COPD.  6.  Lung surgery secondary to rheumatoid arthritis.   ALLERGIES: ERYTHROMYCIN, which causes a rash.   FAMILY HISTORY: CAD.  SOCIAL HISTORY: The patient does not smoke. Occasional alcohol. No illicit drug use. Lives alone. Ambulates on his own. Does not use any home oxygen.   CODE STATUS: FULL code.   HOME MEDICATIONS: 1.  Plavix 75 mg daily.  2.  Plaquenil 200 mg 2 times a day.  3.  Spiriva combination study medication inhaled daily.  4.  Lasix 20 mg daily.  5.  Folic acid 1 mg daily.  6.  Flomax 0.4 mg oral 4 times a day.  7.  Docusate sodium 100 mg oral once a day as needed.  8.  Aspirin 81 mg daily.  9.  Albuterol 2 puffs inhaled 4 times a day as needed.   10.  Albuterol nebulizer every 4 hours as needed.   REVIEW OF SYSTEMS: CONSTITUTIONAL: Complains of fatigue, weakness, weight gain.  EYES: No blurred vision, pain or redness.  EARS, NOSE, AND THROAT: No tinnitus, ear pain, or hearing loss. RESPIRATORY: Has cough, sputum, shortness of breath.  CARDIOVASCULAR: Has orthopnea, edema. GASTROINTESTINAL: No nausea, vomiting, diarrhea, abdominal pain.  GENITOURINARY: No dysuria, hematuria, frequency. ENDOCRINE: No polyuria, nocturia, thyroid problems.  HEMATOLOGIC AND LYMPHATIC: No anemia, easy bruising, bleeding.  INTEGUMENTARY: No acne, rash, lesion.  MUSCULOSKELETAL: No back pain or arthritis. NEUROLOGICAL: No focal numbness, weakness.  PSYCHIATRIC: No anxiety or depression.  PHYSICAL EXAMINATION: VITAL SIGNS: Temperature 98.3, pulse 94, blood pressure 149/74, saturating 94% on BiPAP and 60% on room air.  GENERAL: Obese, Caucasian male patient lying in bed, looks critically ill, in significant respiratory distress.  PSYCHIATRIC: Alert and oriented x3, pleasant.  HEENT: Atraumatic normocephalic. Oral mucosa moist and pink. External ears and nose normal.  NECK: Supple. No thyromegaly. No palpable lymph nodes. Has mild JVD.  CARDIOVASCULAR: S1, S2 with systolic murmur. Peripheral pulses 2+. Has edema in the lower extremities.  RESPIRATORY: Has decreased air entry on both sides with mild wheezing and basilar crackles.  GASTROINTESTINAL: Soft abdomen, nontender. Bowel sounds present. No obvious hepatosplenomegaly palpable.  SKIN: Warm and dry. No petechiae or rash.  MUSCULOSKELETAL: No joint swelling, redness or effusion of the large joints. Normal muscle tone.  NEUROLOGIC: Motor strength 5/5 in upper extremities.   DIAGNOSTIC DATA: Glucose 135. BNP 73. BUN 22, creatinine 0.86, sodium 144, potassium 4, chloride 102, bicarb 38. GFR greater than 60. AST, ALT, alkaline phosphatase, and bilirubin normal. Troponin less than 0.03.   WBC 9.8,  hemoglobin 15, platelets 180,000.   EKG shows normal sinus rhythm with occasional PVCs. Has anterior Q waves.   Chest x-ray shows pulmonary venous congestion, CHF.    ASSESSMENT AND PLAN: 1.  Acute hypoxic respiratory failure in a patient with acute on chronic diastolic congestive heart failure and chronic obstructive pulmonary disease exacerbation. At this point, the patient's BNP is normal at 73, but he complains of significant orthopnea, sleeping in a recliner for the past few days, lower extremity edema and chest x-ray showing congestive heart failure. He will be started on IV Lasix b.i.d. after a stat dose now. We will also put him on steroids. The patient will be placed on BiPAP as he continues to desat on nasal cannula. We will admit the patient to stepdown. He is a high risk for intubation. I will request pulmonology to follow the patient while he is here in the hospital as he may need further intubation and ventilatory support, which I have discussed with the patient. He is FULL code. High risk for cardiac arrest and death. Also start him on Levaquin, monitor ins and outs, repeat BMP in the morning and check an echocardiogram.  2.  Hypertension. Continue medications.  3.  Coronary artery disease. The patient does not have any chest pain. Troponin has been checked in the Emergency Room and is normal.  4.  Deep vein thrombosis prophylaxis with Lovenox.   CODE STATUS: FULL code.   CRITICAL CARE TIME SPENT ON PATIENT TODAY: 45 minutes.   ____________________________ Molinda Bailiff Franshesca Chipman, MD srs:sb D: 07/09/2014 11:53:22 ET T: 07/09/2014 12:11:38 ET JOB#: [id number]  cc: Wardell Heath R. Elpidio Anis, MD, <Dictator> Lamar Blinks, MD Marya Amsler. Dareen Piano, MD Orie Fisherman MD ELECTRONICALLY SIGNED 07/12/2014 16:25

## 2014-07-14 NOTE — Progress Notes (Signed)
Physical Therapy Evaluation Patient Details Name: Andre Hayes MRN: [id number] DOB: Jun 07, 1940 Today's Date: 07/14/2014   History of Present Illness  74 yo male with independent life and driving was admitted to hosp with respiratory crisis with CHF,  intubated and then sedated, extubated 4/30 and now referred to PT.  PMHx:  CHF, RA, CAD with stenting, HLD, COPD  Clinical Impression  Pt was assessed bedside in ICU and is still lethargic from his treatment to restore breathing.  He will be referred to inpt therapy in CIR as he was completely independent and able to drive, carry on full activity prior to this respiratory crisis.    Follow Up Recommendations CIR;Supervision/Assistance - 24 hour    Equipment Recommendations  Other (comment) (await disposition after rehab for equipment)    Recommendations for Other Services Rehab consult     Precautions / Restrictions Precautions Precautions: Fall;Other (comment) (continual monitoring with telemetry) Restrictions Weight Bearing Restrictions: No      Mobility  Bed Mobility Overal bed mobility: Needs Assistance;+2 for physical assistance;+ 2 for safety/equipment Bed Mobility: Rolling;Supine to Sit;Sit to Supine Rolling: Max assist   Supine to sit: +2 for physical assistance;Total assist Sit to supine: +2 for physical assistance;Total assist   General bed mobility comments: Pt cannot control his extremities well enough to reposition himself  Transfers Overall transfer level: Needs assistance Equipment used:  (unable to stand or slide over)             General transfer comment: unable to attempt due to lethargy from pt  Ambulation/Gait             General Gait Details: unable to walk  Stairs            Wheelchair Mobility    Modified Rankin (Stroke Patients Only)       Balance Overall balance assessment: Needs assistance Sitting-balance support: Bilateral upper extremity supported (mod assist from PT  once set) Sitting balance-Leahy Scale: Poor     Standing balance support:  (unable) Standing balance-Leahy Scale: Zero                               Pertinent Vitals/Pain Pain Assessment: No/denies pain    Home Living Family/patient expects to be discharged to:: Private residence Living Arrangements: Alone Available Help at Discharge: Family Type of Home: House Home Access: Stairs to enter Entrance Stairs-Rails: Right;Left;Can reach both Secretary/administrator of Steps: 3 Home Layout: One level Home Equipment: None      Prior Function Level of Independence: Independent         Comments: Driving      Hand Dominance        Extremity/Trunk Assessment   Upper Extremity Assessment: Generalized weakness           Lower Extremity Assessment: Generalized weakness      Cervical / Trunk Assessment: Kyphotic  Communication   Communication: Expressive difficulties  Cognition Arousal/Alertness: Lethargic;Suspect due to medications Behavior During Therapy: Flat affect Overall Cognitive Status: Impaired/Different from baseline Area of Impairment: Attention;Memory;Awareness;Problem solving   Current Attention Level: Divided Memory: Decreased short-term memory     Awareness: Intellectual Problem Solving: Slow processing;Decreased initiation;Difficulty sequencing;Requires tactile cues;Requires verbal cues General Comments: Pt was sedated until extubated and seems groggy from this    General Comments General comments (skin integrity, edema, etc.): Pt was in bed with sleepy appearance and now is having a great deal of difficulty due to  medical issues, but vitals are better with sitting than antipated:  pulse 81, sat97% and RR 31    Exercises        Assessment/Plan    PT Assessment Patient needs continued PT services  PT Diagnosis Generalized weakness;Altered mental status   PT Problem List Decreased strength;Decreased range of motion;Decreased  activity tolerance;Decreased balance;Decreased mobility;Decreased coordination;Decreased cognition;Decreased knowledge of use of DME;Decreased safety awareness;Cardiopulmonary status limiting activity;Obesity  PT Treatment Interventions Gait training;DME instruction;Functional mobility training;Therapeutic activities;Therapeutic exercise;Neuromuscular re-education;Balance training;Cognitive remediation;Patient/family education   PT Goals (Current goals can be found in the Care Plan section) Acute Rehab PT Goals Patient Stated Goal: none stated PT Goal Formulation: With patient/family Time For Goal Achievement: 07/28/14 Potential to Achieve Goals: Good    Frequency Min 2X/week   Barriers to discharge Inaccessible home environment;Decreased caregiver support      Co-evaluation               End of Session Equipment Utilized During Treatment: Oxygen Activity Tolerance: Patient limited by lethargy;Treatment limited secondary to medical complications (Comment);Other (comment) (On monitors and cannot remove them to get out of bed ) Patient left: in bed;with call bell/phone within reach;with family/visitor present;Other (comment) (Doctor in room) Nurse Communication: Mobility status;Precautions         Time: [phone] PT Time Calculation (min) (ACUTE ONLY): 30 min   Charges:   PT Evaluation $Initial PT Evaluation Tier I: 1 Procedure PT Treatments $Therapeutic Activity: 8-22 mins   PT G Codes:        Ivar Drape 08/04/2014, 5:36 PM  Samul Dada, PT MS Acute Rehab Dept. Number: [phone]

## 2014-07-14 NOTE — Progress Notes (Signed)
Patient refuses to wear bipap.

## 2014-07-14 NOTE — Progress Notes (Signed)
SUBJECTIVE:  Review of Systems:  Subjective/Chief Complaint Presented with SOB, orthopnea and edema. Was on Bipap, worsened and intubated in CCU 4/27 2 AM. Extubated 07/13/14.   Fever/Chills No  Denies SOB, Cough, Chest pain  No abdominal pain, Nausea/vomiting/ diarrhea.   Telemetry Reviewed NSR   ROS Pt not able to provide ROS  intubated, on vent support,   Medications/Allergies Reviewed Medications/Allergies reviewed   VITAL SIGNS/ANCILLARY NOTES: **Vital Signs.: Filed Vitals:   07/14/14 1600  BP: 140/70  Pulse: 77  Temp:   Resp: 26                                                         Physical Exam:  GEN well developed, disheveled.   HEENT pink conjunctivae, hearing intact to voice, dry oral mucosa, ETT in place.   NECK supple  No masses  thyroid not tender   RESP normal resp effort  clear BS  no use of accessory muscles   CARD regular rate  murmur present  LE edema present  no JVD  + 1 edema b/l   ABD denies tenderness  soft  normal BS  no Abdominal Bruits  no Adominal Mass   GU foley catheter in place  clear yellow urine draining   EXTR negative cyanosis/clubbing, positive edema, + 1 edema b/l   SKIN normal to palpation, No rashes, No ulcers   NEURO negative tremor   PSYCH lethargic, on vent.   Lab Results: Routine Chem:  30-Apr-16 06:32   Glucose, Serum  120 (65-99 NOTE: New Reference Range  05/21/14)  BUN  30 (6-20 NOTE: New Reference Range  05/21/14)  Creatinine (comp) 0.83 (0.61-1.24 NOTE: New Reference Range  05/21/14)  Sodium, Serum 135 (135-145 NOTE: New Reference Range  05/21/14)  Potassium, Serum 3.5 (3.5-5.1 NOTE: New Reference Range  05/21/14)  Chloride, Serum 103 (101-111 NOTE: New Reference Range  05/21/14)  CO2, Serum 26 (22-32 NOTE: New Reference Range  05/21/14)  Calcium (Total), Serum  8.8 (8.9-10.3 NOTE: New Reference Range  05/21/14)  Anion Gap  6  eGFR (African American) >60  eGFR (Non-African  American) >60 (eGFR values <38m/min/1.73 m2 may be an indication of chronic kidney disease (CKD). Calculated eGFR is useful in patients with stable renal function. The eGFR calculation will not be reliable in acutely ill patients when serum creatinine is changing rapidly. It is not useful in patients on dialysis. The eGFR calculation may not be applicable to patients at the low and high extremes of body sizes, pregnant women, and vegetarians.)   ASSESSMENT/PLAN:  Assessment/Plan:  Orders/Results reviewed Orders reviewed and updated, Laboratory results reviewed, Radiology results reviewed, Vital Signs reviewed   Assessment/Plan 74yo male w/ hx of COPD, CHF, hx of CAD, hx of RA, BPH came into hospital due to shortness of breath, LE edema and noted to be in acute resp. failure.   * Acute Resp. Failure w/ hpoxia,hypercarbia - due to combination of CHF, COPD Exacerbation.  IV steroids, nebs, vent support. Extubated 07/13/14  Bipap as needed.  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  - cont. diuresis w/ IV lasix - stopped due to hypercapnea. - Monitor I/Os- total 2200 ml negative balance .  * COPD Exacerbation - cont. IV steroids, duonebs, empirical abx.  - on bipap  * BPH -  cont. Flomax.   * hx of RA - cont. Plaquenil, Folic Acid   (continued)   transfer to floor- PT eval and SLP eval.   DVT Prophylaxis Lovenox   Discharge Planning home   Code Status Full Code   Critical Care No, Total patient care time:, 35 min   Case Discussed With nursing

## 2014-07-14 NOTE — Progress Notes (Signed)
Andre Hayes (son) and wife Andre Hayes  called to check on the patient. Family was concerned why patient is not getting IV fluids and wanted MD to call them and explain.  Dr. Clent Ridges notified and received information to convey  to the family that because patient has CHF and is fluid overload, they are trying  to take him off from fluid and are  diuresis him now. Dr. Clent Ridges also indicated that they are trying to come up with a different diuresing  And that was also conveyed to the family as well. Andre Hayes indicated that they want the treatment team to exhaust  their options in calming the patient down before they try to use any anxiety medication so patient can do his best during swallowing studies and that will be passed on to the incoming nurse in the morning.

## 2014-07-14 NOTE — Consult Note (Addendum)
PATIENT NAME:  Andre Hayes, Andre Hayes MR#:  [id number] DATE OF BIRTH:  June 28, 1940  DATE OF CONSULTATION:  07/12/2014  REFERRING PHYSICIAN:   CONSULTING PHYSICIAN:  Marcina Millard, MD  PRIMARY CARE PHYSICIAN: Dareen Piano.   CARDIOLOGIST: Lamar Blinks, MD   CHIEF COMPLAINT: Shortness of breath.   REASON FOR CONSULTATION: Consultation requested for evaluation of atrial fibrillation and congestive heart failure.   HISTORY OF PRESENT ILLNESS: The patient is a 74 year old gentleman with history of coronary artery disease and stent to RCA and COPD, who presented to [facility] on 07/09/2014 with respiratory failure. The patient presented with a 3-day history of worsening shortness of breath, was found to be hypoxic in the Emergency Room. The patient's chest x-ray revealed evidence for CHF. The patient was treated with intravenous furosemide, intravenous antibiotics, and BiPAP. The patient developed worsening hypoxia and was intubated on 07/10/2014. The patient has failed weaning. He has had intermittent atrial fibrillation and a rapid ventricular rate. Currently, the patient is in sinus bradycardia and hemodynamically stable. Admission labs were notable for a negative troponin. Echocardiogram on day of admission revealed normal left ventricular function with LVEF of 55% to 60% without significant valvular abnormalities.   PAST MEDICAL HISTORY: 1.  Coronary artery disease, status post drug-eluting stent RCA 2011.  2.  COPD.  3.  Hyperlipidemia.  4.  Rheumatoid arthritis.  5.  Lung surgery secondary to rheumatoid arthritis.   MEDICATIONS: Aspirin 81 mg daily, Plavix 75 mg daily, Plaquenil 200 mg b.i.d., Spiriva, Lasix 20 mg daily, folic acid 1 mg daily, Flomax 0.4 mg q.i.d., docusate sodium 100 mg daily, albuterol 2 puffs q.i.d. p.r.n.   SOCIAL HISTORY: The patient currently does not smoke.   FAMILY HISTORY: Positive for coronary artery disease.   REVIEW OF SYSTEMS: Unobtainable. The patient  currently is intubated.   PHYSICAL EXAMINATION: VITAL SIGNS: Blood pressure 99/62, pulse 52, respirations 24, temperature 98.7, pulse oximetry 98%.  HEENT: The patient is intubated.  NECK: Supple without thyromegaly.  LUNGS: Clear.  HEART: Normal JVP. Normal PMI. Regular rate and rhythm. Normal S1, S2. No appreciable gallop, murmur, or rub.  ABDOMEN: Soft and nontender. Pulses were intact bilaterally.  MUSCULOSKELETAL: Normal muscle tone.  NEUROLOGIC: The patient heavily sedated and intubated.   IMPRESSION: A 74 year old gentleman who presents with chronic obstructive pulmonary disease exacerbation, element of diastolic congestive heart failure requiring intubation and mechanical ventilator. The patient has had difficulty to wean, but no evidence for ongoing congestive heart failure or myocardial ischemia. Troponin is negative. The patient had paroxysmal atrial fibrillation, currently in sinus bradycardia.   RECOMMENDATIONS: 1.  Agree with overall current therapy.  2    Would defer chronic anticoagulation at this time.  3.   Continue dual antiplatelet therapy. Hesitant to start chronic anticoagulation with warfarin or novel oral anticoagulants. The patient CHADS-VASc score is currently 2.  4.   Further recommendations pending the patient's clinical course.     ____________________________ Marcina Millard, MD ap:at D: 07/12/2014 15:33:12 ET T: 07/12/2014 15:48:24 ET JOB#: [id number]  cc: Marcina Millard, MD, <Dictator> Marcina Millard MD ELECTRONICALLY SIGNED 07/13/2014 11:12

## 2014-07-15 LAB — BASIC METABOLIC PANEL
Anion gap: 6 (ref 5–15)
BUN: 34 mg/dL — AB (ref 6–20)
CHLORIDE: 104 mmol/L (ref 101–111)
CO2: 31 mmol/L (ref 22–32)
CREATININE: 0.73 mg/dL (ref 0.61–1.24)
Calcium: 9.3 mg/dL (ref 8.9–10.3)
Glucose, Bld: 98 mg/dL (ref 65–99)
Potassium: 4 mmol/L (ref 3.5–5.1)
Sodium: 141 mmol/L (ref 135–145)

## 2014-07-15 LAB — GLUCOSE, CAPILLARY
GLUCOSE-CAPILLARY: 205 mg/dL — AB (ref 70–99)
Glucose-Capillary: 112 mg/dL — ABNORMAL HIGH (ref 70–99)
Glucose-Capillary: 117 mg/dL — ABNORMAL HIGH (ref 70–99)
Glucose-Capillary: 85 mg/dL (ref 70–99)

## 2014-07-15 LAB — MAGNESIUM: Magnesium: 2.2 mg/dL (ref 1.7–2.4)

## 2014-07-15 LAB — PHOSPHORUS: PHOSPHORUS: 2 mg/dL — AB (ref 2.5–4.6)

## 2014-07-15 LAB — CBC
HCT: 50.6 % (ref 40.0–52.0)
Hemoglobin: 15.8 g/dL (ref 13.0–18.0)
MCH: 24.5 pg — ABNORMAL LOW (ref 26.0–34.0)
MCHC: 31.2 g/dL — ABNORMAL LOW (ref 32.0–36.0)
MCV: 78.3 fL — ABNORMAL LOW (ref 80.0–100.0)
Platelets: 183 10*3/uL (ref 150–440)
RBC: 6.46 MIL/uL — ABNORMAL HIGH (ref 4.40–5.90)
RDW: 14.9 % — ABNORMAL HIGH (ref 11.5–14.5)
WBC: 10.9 10*3/uL — ABNORMAL HIGH (ref 3.8–10.6)

## 2014-07-15 MED ORDER — LORAZEPAM 2 MG/ML IJ SOLN: 0.5000 mg | Freq: Two times a day (BID) | INTRAMUSCULAR | Status: DC | PRN

## 2014-07-15 NOTE — Progress Notes (Signed)
Physical Therapy Treatment Patient Details Name: Andre Hayes MRN: [id number] DOB: Sep 29, 1940 Today's Date: 07/15/2014    History of Present Illness 74 yo male with independent life and driving was admitted to hosp with respiratory crisis with CHF,  intubated and then sedated, extubated 4/30 and now referred to PT.  PMHx:  CHF, RA, CAD with stenting, HLD, COPD    PT Comments    Pt was able to stand with 2 person assist for a short time today after extensive time correcting sitting control and deciding against the chair as a destination.  Pt is unsafely moving with PT as he thinks he can just stand up and get moving although this is definitely improved over yesterday.  Will focus on active use of LE's and controlling balance to progress function.  Follow Up Recommendations  CIR;Supervision/Assistance - 24 hour     Equipment Recommendations  Other (comment) (awaiting disposition from rehab)    Recommendations for Other Services Rehab consult     Precautions / Restrictions Precautions Precautions: Fall Restrictions Weight Bearing Restrictions: No    Mobility  Bed Mobility Overal bed mobility: Needs Assistance Bed Mobility: Supine to Sit;Sit to Supine Rolling: Max assist   Supine to sit: Max assist Sit to supine: Max assist;+2 for physical assistance;+2 for safety/equipment   General bed mobility comments: beginning to use LE's to assist with mobility'  Transfers Overall transfer level: Needs assistance Equipment used: Rolling walker (2 wheeled);2 person hand held assist Transfers: Sit to/from Stand Sit to Stand: Max assist;+2 physical assistance;+2 safety/equipment         General transfer comment: Pt responds inconsistently to cues for standing up but is making a good effort for all  Ambulation/Gait             General Gait Details: unable to walk yet   Stairs            Wheelchair Mobility    Modified Rankin (Stroke Patients Only)        Balance Overall balance assessment: Needs assistance Sitting-balance support: Feet supported;Bilateral upper extremity supported Sitting balance-Leahy Scale: Poor   Postural control: Posterior lean Standing balance support: Bilateral upper extremity supported Standing balance-Leahy Scale: Zero                      Cognition Arousal/Alertness: Lethargic;Suspect due to medications Behavior During Therapy: Restless;Impulsive Overall Cognitive Status: Impaired/Different from baseline Area of Impairment: Attention;Memory;Safety/judgement;Awareness;Problem solving   Current Attention Level: Divided Memory: Decreased short-term memory;Decreased recall of precautions   Safety/Judgement: Decreased awareness of safety;Decreased awareness of deficits Awareness: Intellectual Problem Solving: Slow processing;Difficulty sequencing;Requires tactile cues;Requires verbal cues General Comments: Pt was sedated until extubated and seems groggy from this    Exercises      General Comments General comments (skin integrity, edema, etc.): Pt is beginning to work on functional tasks and now presents a better picture of being able to control some of his sitting balance and more midline awareness.  Very motivated if not aware of his limits.      Pertinent Vitals/Pain Pain Assessment: No/denies pain    Home Living                      Prior Function            PT Goals (current goals can now be found in the care plan section) Acute Rehab PT Goals Patient Stated Goal: To get up and walk Progress towards PT goals: Progressing  toward goals    Frequency  Min 2X/week    PT Plan Current plan remains appropriate    Co-evaluation             End of Session Equipment Utilized During Treatment: Oxygen;Gait belt Activity Tolerance: Patient limited by lethargy;Patient tolerated treatment well Patient left: in bed;with call bell/phone within reach;with bed alarm set;with  nursing/sitter in room     Time: 1130-1215 PT Time Calculation (min) (ACUTE ONLY): 45 min  Charges:  $Therapeutic Activity: 23-37 mins $Neuromuscular Re-education: 8-22 mins                    G Codes:      Ivar Drape Aug 10, 2014, 1:43 PM   Samul Dada, PT MS Acute Rehab Dept. Number: [phone]

## 2014-07-15 NOTE — Progress Notes (Signed)
PT is recommending rehab. CSW is currently evaluating Andre Hayes for rehab placement. Dr Esaw Grandchild reports that he can discharge Andre Hayes to rehab tomorrow. Andre Ladouceur has [facility] which will require preauthorization.

## 2014-07-15 NOTE — Progress Notes (Signed)
ANTIBIOTIC CONSULT NOTE - INITIAL  Pharmacy Consult for Zosyn Indication: Acute respiratory failure/COPD exacerbation  Allergies  Allergen Reactions  . Erythromycin Base Rash    Patient Measurements: Height: 5' 11.97" (182.8 cm) Weight: 243 lb 6.4 oz (110.406 kg) IBW/kg (Calculated) : 77.53   Vital Signs: Temp: 98 F (36.7 C) (05/02 0031) Temp Source: Oral (05/02 0031) BP: 151/54 mmHg (05/02 0429) Pulse Rate: 90 (05/02 0429)  Labs:  Recent Labs  07/12/14 1109 07/13/14 0632 07/14/14 0425 07/15/14 0603  WBC 12.2* 10.4  --  10.9*  HGB 15.3 15.2  --  15.8  PLT 176 162  --  183  CREATININE  --  0.83 0.84 0.73   Estimated Creatinine Clearance: 103.9 mL/min (by C-G formula based on Cr of 0.73).   Microbiology: No results found for this or any previous visit (from the past 720 hour(s)).  Medical History: Past Medical History  Diagnosis Date  . COPD (chronic obstructive pulmonary disease)   . Hyperlipidemia   . Pneumonia 05/02/14    not hospitalized  . Rheumatoid arthritis   . Pulmonary disease   . CAD (coronary artery disease)     Medications:  Scheduled:  . ALPRAZolam  0.5 mg Oral 3 times per day  . aspirin EC  81 mg Oral Daily  . budesonide (PULMICORT) nebulizer solution  0.5 mg Nebulization BID  . clopidogrel  75 mg Oral Daily  . docusate  50 mg Oral BID  . enoxaparin (LOVENOX) injection  40 mg Subcutaneous Q24H  . famotidine  20 mg Oral BID  . folic acid  1 mg Oral Daily  . furosemide  20 mg Oral Daily  . hydroxychloroquine  200 mg Oral BID  . insulin aspart  1-10 Units Subcutaneous 4 times per day  . ipratropium-albuterol  3 mL Nebulization QID  . methylPREDNISolone (SOLU-MEDROL) injection  40 mg Intravenous Q24H  . piperacillin-tazobactam (ZOSYN)  IV  3.375 g Intravenous Q8H  . potassium chloride  20 mEq Oral Daily  . QUEtiapine  25 mg Oral QHS  . sennosides  5 mL Oral BID  . tamsulosin  0.4 mg Oral QHS   Assessment: Patient extubated and moved  to floor on 5/1.  Currently order Zosyn 3.375 gm IV q8h per EI protocol. No BCx/sputum Cx ordered.   Goal of Therapy:  Resolution of infection  Plan:  Continue Zosyn 3.375 gm IV q8h per EI protocol based on patient's renal function.  Pharmacy will continue to follow.  Clarisa Schools, PharmD Clinical Pharmacist 07/15/2014

## 2014-07-15 NOTE — Progress Notes (Signed)
Received prescreen request from PT for possible inpatient rehab consult and have reviewed pt's case. Pt is currently needing total assistance of 2 for limited bed mobility. Pt has [employer] Medicare and he will need to demonstrate an increased activity tolerance for our rehab program/for insurance authorization. I will follow pt's case and make a formal recommendation for possible rehab consult as he progresses with therapy. I am also not sure if [facility] would give authorization for inpatient rehab based on his current diagnosis of deconditioning related to respiratory crisis.  I will follow along for his progress. Thanks.  Juliann Mule, PT Rehabilitation Admissions Coordinator [phone]

## 2014-07-15 NOTE — Plan of Care (Signed)
Problem: SLP Dysphagia Goals Goal: Misc Dysphagia Goal Pt will safely tolerate po diet of least restrictive consistency w/ no overt s/s of aspiration noted by Staff/pt/family x3 sessions.    

## 2014-07-15 NOTE — Progress Notes (Addendum)
Patient refused to wear BIPAP this shift. Respiratory tried two times but still refused the BIPAP. Patient has not slept the whole night. No acute distress noted. Patient still confused . Remain NSR on the heart monitor. Foley intact and draining clear yellow urine.  Patient remain NPO for the swallowing test today

## 2014-07-15 NOTE — Care Management Note (Signed)
Case Management Note  Patient Details  Name: ARZELL MCGEEHAN MRN: [id number] Date of Birth: 01-12-1941  Subjective/Objective:                    Action/Plan:   Expected Discharge Date:  07/17/14               Expected Discharge Plan:     In-House Referral:     Discharge planning Services     Post Acute Care Choice:    Choice offered to:     DME Arranged:    DME Agency:     HH Arranged:    HH Agency:     Status of Service:     Medicare Important Message Given:   07/15/14 Date Medicare IM Given:    Medicare IM give by:   Jo Booze, CM Date Additional Medicare IM Given:    Additional Medicare Important Message give by:     If discussed at Long Length of Stay Meetings, dates discussed:    Additional Comments:  Maejor Erven A, RN 07/15/2014, 10:17 AM

## 2014-07-15 NOTE — Progress Notes (Signed)
Nutrition Follow-up  DOCUMENTATION CODES:   INTERVENTION: Meals and Snacks: Cater to patient preferences Medical Nutrition Supplement: will monitor intake and need for additional nutritional supplementation on follow  NUTRITION DIAGNOSIS:  Inadequate oral intake continues as evidenced by meal completion < 50%.   GOAL: Energy intake:Patient will meet greater than or equal to 90% of their needs- goal not currently being met    MONITOR:  PO intake, Weight trends, Labs  REASON FOR ASSESSMENT:  Other (Comment) (RD Follow Up)    ASSESSMENT:  Typical Fluid/ Food Intake: "bites of food" recorded per I/O  Labs:  Electrolyte and Renal Profile:   Recent Labs Lab 07/13/14 0632 07/14/14 0425 07/15/14 0603  BUN 30* 28* 34*  CREATININE 0.83 0.84 0.73  NA 135 140 141  K 3.5 3.8 4.0  MG  --  2.3 2.2  PHOS  --  2.7 2.0*    Meds: Folate, SSI, lasix, MVI, KCl, Colace  UOP: Reviewed Digestive System: Reviewed  Weight Changes: Reviewed  Height:  Ht Readings from Last 1 Encounters:  07/12/14 5' 11.97" (1.828 m)    Weight:  Wt Readings from Last 1 Encounters:  07/15/14 243 lb 6.4 oz (110.406 kg)    Ideal Body Weight:     Wt Readings from Last 10 Encounters:  07/15/14 243 lb 6.4 oz (110.406 kg)  06/17/14 264 lb (119.75 kg)    BMI:  Body mass index is 33.04 kg/(m^2).  Estimated Nutritional Needs:  Kcal:  5053-9767 kcal/ day (BEE: 1588 x 1.2 AF x 1.0-1.2 IF) using IBW  Protein:  81-97 g Pro/ day (1.0-1.2 g pro/ kg/ day) using IBW  Fluid:  2025-2427 ml/ day (25-30 ml/ kg) using IBW  Skin:  Reviewed, no issues  Diet Order:  DIET DYS 2 Room service appropriate?: Yes with Assist; Fluid consistency:: Nectar Thick  EDUCATION NEEDS:  No education needs identified at this time   Intake/Output Summary (Last 24 hours) at 07/15/14 1538 Last data filed at 07/15/14 1359  Gross per 24 hour  Intake    170 ml  Output   1250 ml  Net  -1080 ml    Last BM:   Reviewed  Roda Shutters, RDN [phone]  MODERATE Care Level

## 2014-07-15 NOTE — Clinical Social Work Placement (Signed)
   CLINICAL SOCIAL WORK PLACEMENT  NOTE  Date:  07/15/2014  Patient Details  Name: Andre Hayes MRN: [id number] Date of Birth: [birth year]  Clinical Social Work is seeking post-discharge placement for this patient at the Skilled  Nursing Facility level of care (*CSW will initial, date and re-position this form in  chart as items are completed):  Yes   Patient/family provided with [facility] Clinical Social Work Department's list of facilities offering this level of care within the geographic area requested by the patient (or if unable, by the patient's family).  Yes   Patient/family informed of their freedom to choose among providers that offer the needed level of care, that participate in Medicare, Medicaid or managed care program needed by the patient, have an available bed and are willing to accept the patient.  Yes   Patient/family informed of [facility]'s ownership interest in [facility] and [facility], as well as of the fact that they are under no obligation to receive care at these facilities.  PASRR submitted to EDS on 07/15/14     PASRR number received on 07/15/14     Existing PASRR number confirmed on       FL2 transmitted to all facilities in geographic area requested by pt/family on 07/15/14     FL2 transmitted to all facilities within larger geographic area on       Patient informed that his/her managed care company has contracts with or will negotiate with certain facilities, including the following:            Patient/family informed of bed offers received.  Patient chooses bed at    Physician recommends and patient chooses bed at      Patient to be transferred to   on  .  Patient to be transferred to facility by       Patient family notified on   of transfer.  Name of family member notified:        PHYSICIAN       Additional Comment:  CSW will notify pt and family once bed offers have been received.     _______________________________________________ Chauncy Passy, LCSW 07/15/2014, 5:33 PM

## 2014-07-15 NOTE — Evaluation (Signed)
Clinical/Bedside Swallow Evaluation Patient Details  Name: Andre Hayes MRN: [id number] Date of Birth: 1940-08-01  Today's Date: 07/15/2014 Time:        Past Medical History:  Past Medical History  Diagnosis Date  . COPD (chronic obstructive pulmonary disease)   . Hyperlipidemia   . Pneumonia 05/02/14    not hospitalized  . Rheumatoid arthritis   . Pulmonary disease   . CAD (coronary artery disease)    Past Surgical History:  Past Surgical History  Procedure Laterality Date  . Coronary angioplasty with stent placement     HPI:      Assessment / Plan / Recommendation Clinical Impression  Pt presented w/ overt s/s of aspiration w/ trials of thin liquids. He appeared to safely tolerate trials of Nectar liquids and purees following aspiration precautions. Pt required assistance w/ feeding/eating at meals sec. to his Cognitive status. Pt presents w/ increased risk for aspiration at this time.     Aspiration Risk  Moderate    Diet Recommendation Ice chips PRN after oral care;Dysphagia 2 (Fine chop);Nectar   Medication Administration: Crushed with puree    Other  Recommendations Oral Care Recommendations: Oral care BID   Follow Up Recommendations       Frequency and Duration    1 week   Pertinent Vitals/Pain No pain reported   SLP Swallow Goals     Swallow Study Prior Functional Status       General Date of Onset: 07/09/14 Type of Study: Bedside swallow evaluation Previous Swallow Assessment: none indicated Diet Prior to this Study: NPO Temperature Spikes Noted: No Respiratory Status: Supplemental O2 delivered via (comment) History of Recent Intubation: Yes Behavior/Cognition: Confused;Impulsive;Distractible Oral Cavity - Dentition: Other (Comment) (top/bottom dentures) Self-Feeding Abilities: Needs assist Patient Positioning: Upright in bed Baseline Vocal Quality: Normal Volitional Cough: Strong Volitional Swallow: Able to elicit    Oral/Motor/Sensory  Function Overall Oral Motor/Sensory Function: Appears within functional limits for tasks assessed   Ice Chips Ice chips: Within functional limits Presentation: Spoon Other Comments: presented by SLP   Thin Liquid Thin Liquid: Impaired Presentation: Cup Pharyngeal  Phase Impairments: Multiple swallows;Cough - Immediate Other Comments: w/ 2/3 trials    Nectar Thick Nectar Thick Liquid: Within functional limits Presentation: Cup;Straw Other Comments: assisted by SLP   Honey Thick Other Comments: NT   Puree Puree: Within functional limits Presentation: Spoon Other Comments: presented by SLP   Solid   GO    Other Comments: NT       Watson,Katherine 07/15/2014,11:26 AM

## 2014-07-15 NOTE — Progress Notes (Signed)
Obtained arterial blood gas sample with these results:  PH 7.30 C02 74 P02 73 Sa02 98.2 HCO3- 36.4  I have paged the ordering physician and am awaiting call back.  I increased IPAP from 12 to 18 to increase ventilation.  Will report off to oncoming RT

## 2014-07-15 NOTE — Progress Notes (Signed)
Pt son having some complaints about general care regarding physician and nursing staff. Pt son expresses he feel it is better for patient care and his involvement in patient care if he is transferred. Pt son has spoken to myself and Water quality scientist about issue. Pt son request to transfer patient to another facility. MD notified. MD plans to review patient chart and start transfer if safe. Information relayed to son. Will continue to monitor patient.

## 2014-07-15 NOTE — Evaluation (Signed)
Occupational Therapy Evaluation Patient Details Name: Andre Hayes MRN: [id number] DOB: 09-12-1940 Today's Date: 07/15/2014    History of Present Illness 74 yo male with independent life and driving was admitted to hosp with respiratory crisis with CHF, intubated and then sedated, extubated 4/30 and now referred to OT. PMHx: CHF, RA, CAD with stenting, HLD, COPD   Clinical Impression   Pt presents with overall decreased endurance and cognitive changes and kept asking what happened to his car when asked what brought him into the hospital.  Pt is oriented to name but not date (thought it was 06-30-14) or place.  He lives at home alone and has a friend, Babette Relic, who lives in the cottage behind his house who was present visiting prior to OT evaluation but she works and it is not clear if she is able to provide any assistance.  He requires max assist of 2 for bed mobility and transfers and max assist for LB dressing and bathing skills which were assessed at bed level.  He presents with decreased coordination in B UE and hands but sensation is intact.  He would benefit from acute inpatient rehab to achieve prior level of function.       Follow Up Recommendations  CIR    Equipment Recommendations  3 in 1 bedside comode;Tub/shower seat    Recommendations for Other Services PT consult;Speech consult     Precautions / Restrictions Precautions Precautions: Fall Restrictions Weight Bearing Restrictions: No      Mobility Bed Mobility Overal bed mobility: Needs Assistance Bed Mobility: Supine to Sit;Sit to Supine Rolling: Max assist   Supine to sit: Max assist Sit to supine: Max assist;+2 for physical assistance;+2 for safety/equipment   General bed mobility comments: beginning to use LE's to assist with mobility'  Transfers Overall transfer level: Needs assistance Equipment used: Rolling walker (2 wheeled);2 person hand held assist Transfers: Sit to/from Stand Sit to Stand: Max  assist;+2 physical assistance;+2 safety/equipment         General transfer comment: Pt responds inconsistently to cues for standing up but is making a good effort for all    Balance Overall balance assessment: Needs assistance Sitting-balance support: Feet supported;Bilateral upper extremity supported Sitting balance-Leahy Scale: Poor   Postural control: Posterior lean Standing balance support: Bilateral upper extremity supported Standing balance-Leahy Scale: Zero                              ADL Overall ADL's : Needs assistance/impaired Eating/Feeding: Minimal assistance;Cueing for compensatory techinques;Cueing for sequencing;Cueing for safety   Grooming: Brushing hair;Minimal assistance;Wash/dry face   Upper Body Bathing: Minimal assitance;Bed level   Lower Body Bathing: +2 for physical assistance   Upper Body Dressing : Minimal assistance   Lower Body Dressing: +2 for physical assistance;Cueing for sequencing Lower Body Dressing Details (indicate cue type and reason): pt requires extra time for processing and is easily distracted off topic, decreased coordination in B hands and                General ADL Comments: Pt is limited in ADLs due to decreased cognition, decreased endurance, decreased LB dressing and requires max of 2 for transfers and to stand so ADLs assessed at bed level today     Vision     Perception     Praxis      Pertinent Vitals/Pain Pain Assessment: No/denies pain     Hand Dominance Right   Extremity/Trunk  Assessment Upper Extremity Assessment Upper Extremity Assessment: Generalized weakness   Lower Extremity Assessment Lower Extremity Assessment: Defer to PT evaluation   Cervical / Trunk Assessment Cervical / Trunk Assessment: Kyphotic   Communication Communication Communication: HOH   Cognition Arousal/Alertness: Awake/alert Behavior During Therapy: WFL for tasks assessed/performed;Flat affect Overall Cognitive  Status: No family/caregiver present to determine baseline cognitive functioning Area of Impairment: Orientation;Memory;Awareness;Problem solving Orientation Level: Place;Time;Situation Current Attention Level: Sustained Memory: Decreased recall of precautions;Decreased short-term memory   Safety/Judgement: Decreased awareness of safety;Decreased awareness of deficits Awareness: Intellectual Problem Solving: Slow processing;Difficulty sequencing;Requires tactile cues;Requires verbal cues General Comments:  (Pt more alert today but continues to be disoriented and confused)   General Comments       Exercises       Shoulder Instructions      Home Living Family/patient expects to be discharged to:: Private residence (Pt was living home alone with a friend, Babette Relic, living in cottage behind him ) Living Arrangements: Alone Available Help at Discharge: Friend(s) (Tammy who lives behind him but she works) Type of Home: International aid/development worker Shower/Tub: Therapist, sports characteristics: Engineer, building services: Standard Bathroom Accessibility: Yes How Accessible: Other (comment) (Pt was not using any AD prior to this admission) Home Equipment: Shower seat;Bedside commode;Hand held shower head          Prior Functioning/Environment Level of Independence: Independent             OT Diagnosis: Generalized weakness;Cognitive deficits;Altered mental status   OT Problem List: Decreased strength;Decreased activity tolerance;Impaired balance (sitting and/or standing);Decreased safety awareness;Decreased cognition;Decreased coordination   OT Treatment/Interventions: Self-care/ADL training;Cognitive remediation/compensation;Patient/family education;Therapeutic exercise;Therapeutic activities    OT Goals(Current goals can be found in the care plan section) Acute Rehab OT Goals Patient Stated Goal: "to be able to do everything for myself like I did before." OT Goal  Formulation: With patient Time For Goal Achievement: 07/29/14 Potential to Achieve Goals: Good  OT Frequency: Min 2X/week   Barriers to D/C: Decreased caregiver support          Co-evaluation              End of Session Equipment Utilized During Treatment: Oxygen  Activity Tolerance: Patient limited by fatigue Patient left: in bed;with bed alarm set   Time: 1415-1500 OT Time Calculation (min): 45 min Charges:  OT General Charges $OT Visit: 1 Procedure OT Evaluation $Initial OT Evaluation Tier I: 1 Procedure OT Treatments $Self Care/Home Management : 8-22 mins $Therapeutic Activity: 8-22 mins G-Codes:    Wofford,Susan Jul 26, 2014, 3:18 PM   Susanne Borders, OTR/L

## 2014-07-15 NOTE — Clinical Social Work Note (Signed)
Clinical Social Work Assessment  Patient Details  Name: Andre Hayes MRN: [id number] Date of Birth: 1940-04-08  Date of referral:  07/15/14               Reason for consult:  Facility Placement                Permission sought to share information with:    Permission granted to share information::  Yes, Verbal Permission Granted  Name::        Agency::     Relationship::     Contact Information:     Housing/Transportation Living arrangements for the past 2 months:  Single Family Home Source of Information:  Patient, Friend/Neighbor Patient Interpreter Needed:  None Criminal Activity/Legal Involvement Pertinent to Current Situation/Hospitalization:  No - Comment as needed Significant Relationships:  Adult Children Kentarius Partington [phone]) Lives with:  Self Do you feel safe going back to the place where you live?    Need for family participation in patient care:  Yes (Comment)  Care giving concerns:  Pt did not express concerns to CSW, however pt was not fully oriented.  CSW will f/u with family    Social Worker assessment / plan:  CSW is currently working to find SNF bed for pt.  CSW will also speak to family to determine if LTC is needed during bed search.    Employment status:  Retired Health and safety inspector:  Medicare PT Recommendations:  Skilled Nursing Facility Information / Referral to community resources:     Patient/Family's Response to care:  Pt as in agreement with SNF placement.  Patient/Family's Understanding of and Emotional Response to Diagnosis, Current Treatment, and Prognosis:  CSW will speak to pt's family tomorrow.  Emotional Assessment Appearance:  Appears stated age Attitude/Demeanor/Rapport:   (Confused, but pleasent) Affect (typically observed):    Orientation:    Alcohol / Substance use:    Psych involvement (Current and /or in the community):     Discharge Needs  Concerns to be addressed:    Readmission within the last 30 days:    Current  discharge risk:  Cognitively Impaired, Lives alone Barriers to Discharge:   (CSW still working to find a SNF bed for pt)   Chauncy Passy, LCSW 07/15/2014, 5:47 PM

## 2014-07-16 LAB — BLOOD GAS, ARTERIAL
Acid-Base Excess: 7.2 mmol/L — ABNORMAL HIGH (ref 0.0–3.0)
Allens test (pass/fail): POSITIVE — AB
Bicarbonate: 36.4 mEq/L — ABNORMAL HIGH (ref 21.0–28.0)
FIO2: 30 %
Mode: POSITIVE
O2 Saturation: 92.8 %
Patient temperature: 37
pCO2 arterial: 74 mmHg (ref 32.0–48.0)
pH, Arterial: 7.3 — ABNORMAL LOW (ref 7.350–7.450)
pO2, Arterial: 73 mmHg — ABNORMAL LOW (ref 83.0–108.0)

## 2014-07-16 LAB — GLUCOSE, CAPILLARY
Glucose-Capillary: 170 mg/dL — ABNORMAL HIGH (ref 70–99)
Glucose-Capillary: 190 mg/dL — ABNORMAL HIGH (ref 70–99)
Glucose-Capillary: 196 mg/dL — ABNORMAL HIGH (ref 70–99)
Glucose-Capillary: 88 mg/dL (ref 70–99)

## 2014-07-16 MED ORDER — LEVOFLOXACIN 500 MG PO TABS: 500.0000 mg | ORAL_TABLET | Freq: Every day | ORAL | Status: DC

## 2014-07-16 MED ORDER — QUETIAPINE FUMARATE 25 MG PO TABS: 25.0000 mg | ORAL_TABLET | Freq: Every day | ORAL | Status: DC

## 2014-07-16 MED ORDER — ALPRAZOLAM 0.5 MG PO TABS
0.5000 mg | ORAL_TABLET | Freq: Two times a day (BID) | ORAL | Status: DC | PRN
  Filled 2014-07-16: qty 1

## 2014-07-16 MED ORDER — PREDNISONE 10 MG (21) PO TBPK: 10.0000 mg | ORAL_TABLET | Freq: Every day | ORAL | Status: DC

## 2014-07-16 MED ORDER — FLUTICASONE PROPIONATE HFA 44 MCG/ACT IN AERO: 1.0000 | INHALATION_SPRAY | Freq: Two times a day (BID) | RESPIRATORY_TRACT | Status: DC

## 2014-07-16 NOTE — Progress Notes (Signed)
Pt is NSR on tele, VSS with no complaints of pain or discomfort. Pt got up to chair for breakfast and tolerated the chair until lunch. Pts foley was removed and he has since had an incontinent void, blood is still in urine due to trauma from foley. PT tolerating thickened liquids and meds whole in applesauce without difficulty. Plan to discharge pt to rehab in [place], [place] to be close to son. Son has been updated throughout the day.

## 2014-07-16 NOTE — Progress Notes (Signed)
Spoke with son who wishes to transfer patient to Hospital closer to [place], [place].  Son wants for patient's discharge destination to be at a facility closer to where he lives, or at home with him eventually, if possible.  Son wishes to be contacted with updates about this.  Son also [facility] # [phone], Cell # [phone].

## 2014-07-16 NOTE — Progress Notes (Signed)
Speech Language Pathology Treatment: Dysphagia  Patient Details Name: Andre Hayes MRN: [id number] DOB: 1941/01/05 Today's Date: 07/16/2014 Time: 1530-1600 SLP Time Calculation (min) (ACUTE ONLY): 30 min  Assessment / Plan / Recommendation Clinical Impression  Pt presented w/ inconsistent, overt s/s of aspiration w/ trials of thin liquids via cup following aspiration precautions. Pt appears at increased risk for aspiration at this time. Rec. continue w/ current dysphagia diet w/ possible f/u w/ objective swallowing assessment(MBSS) next 1-2 days. Will consult MD re: pt's status.    HPI Other Pertinent Information: Pt more alert and conversive today. He presented w/ less confusion in his communication/interactions w/ SLP. He felt strongly about his "dreams" he was having yesterday about being in [place] and meeting w/ others near [facility] in a "parking lot".   Pertinent Vitals Pain Assessment: No/denies pain  SLP Plan       Recommendations Diet recommendations: Dysphagia 3 (mechanical soft);Nectar-thick liquid Liquids provided via: Cup;Straw Medication Administration: Whole meds with puree Supervision: Patient able to self feed;Intermittent supervision to cue for compensatory strategies Postural Changes and/or Swallow Maneuvers: Seated upright 90 degrees                        Watson,Katherine 07/16/2014, 4:25 PM

## 2014-07-16 NOTE — Progress Notes (Signed)
SUBJECTIVE:  Review of Systems:  Subjective/Chief Complaint Presented with SOB, orthopnea and edema. Was on Bipap, worsened and intubated in CCU 4/27 2 AM. Extubated 07/13/14.   Review of sysrems     CONSTITUTIONAL: No documented fever. No fatigue, weakness. No weight gain, no weight loss.  EYES: No blurry or double vision.  ENT: No tinnitus. No postnasal drip. No redness of the oropharynx.  RESPIRATORY: No cough, mild wheeze, no hemoptysis. No dyspnea.  CARDIOVASCULAR: no chest pain. No orthopnea. No palpitations. No syncope.  GASTROINTESTINAL: No nausea, no vomiting or diarrhea. No abdominal pain. No melena or hematochezia.  GENITOURINARY: No dysuria or hematuria.  ENDOCRINE: No polyuria or nocturia. No heat or cold intolerance.  HEMATOLOGY: No anemia. No bruising. No bleeding.  INTEGUMENTARY: No rashes. No lesions.  MUSCULOSKELETAL: No arthritis. No swelling. No gout.  NEUROLOGIC: No numbness, tingling, or ataxia. No seizure-type activity.  PSYCHIATRIC: No anxiety. No insomnia. No ADD.       Medications/Allergies Reviewed Medications/Allergies reviewed   VITAL SIGNS/ANCILLARY NOTES: **Vital Signs.:  Temp- 99.1 BP- 145/58 Respi- 17 SPO2- 91% at 2 ltr supplement. HR- 82      Physical Exam:  GEN well developed, disheveled.   HEENT pink conjunctivae, hearing intact to voice, dry oral mucosa,    NECK supple  No masses  thyroid not tender   RESP normal resp effort , mild wheeze, no use of accessory muscles   CARD regular rate  murmur present  LE edema present  no JVD  + 1 edema b/l   ABD denies tenderness  soft  normal BS  no Abdominal Bruits  no Adominal Mass   GU foley catheter in place  clear yellow urine draining   EXTR negative cyanosis/clubbing, positive edema, + 1 edema b/l   SKIN normal to palpation, No rashes, No ulcers   NEURO negative tremor, moves all limbs, follows comands.   PSYCH Alert, oriented.   ASSESSMENT/PLAN:  Assessment/Plan:   Orders/Results reviewed Orders reviewed and updated, Laboratory results reviewed, Radiology results reviewed, Vital Signs reviewed   Assessment/Plan 74 yo male w/ hx of COPD, CHF, hx of CAD, hx of RA, BPH came into hospital due to shortness of breath, LE edema and noted to be in acute resp. failure.   * Acute Resp. Failure w/ hpoxia,hypercarbia - due to combination of CHF, COPD Exacerbation.  IV steroids, nebs, vent support initially. Extubated 07/13/14  Bipap as needed. Appreicated help of Pulmonary.  Encouraged pt to use cpap at night.  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  Initially given IV lasix- responded well- stopped now.  * COPD Exacerbation - cont. IV steroids, duonebs, empirical abx.  - on bipap at night.  * BPH - cont. Flomax.   * hx of RA - cont. Plaquenil, Folic Acid   (continued) Need rehab placement.   DVT Prophylaxis Lovenox   Discharge Planning home   Code Status Full Code   Critical Care No, Total patient care time:, 35 min   Case Discussed With Nursing, CSW, Case manager.

## 2014-07-16 NOTE — Progress Notes (Signed)
SUBJECTIVE:  Review of Systems:  Subjective/Chief Complaint Presented with SOB, orthopnea and edema. Was on Bipap, worsened and intubated in CCU 4/27 2 AM. Extubated 07/13/14.  Had episode of SOB last evening- but after starting on Bipap overnight- he is fine now.   Review of sysrems     CONSTITUTIONAL: No documented fever. No fatigue, weakness. No weight gain, no weight loss.  EYES: No blurry or double vision.  ENT: No tinnitus. No postnasal drip. No redness of the oropharynx.  RESPIRATORY: No cough, mild wheeze, no hemoptysis. No dyspnea. uses 2 ltr oxygen. CARDIOVASCULAR: no chest pain. No orthopnea. No palpitations. No syncope.  GASTROINTESTINAL: No nausea, no vomiting or diarrhea. No abdominal pain. No melena or hematochezia.  GENITOURINARY: No dysuria or hematuria.  ENDOCRINE: No polyuria or nocturia. No heat or cold intolerance.  HEMATOLOGY: No anemia. No bruising. No bleeding.  INTEGUMENTARY: No rashes. No lesions.  MUSCULOSKELETAL: No arthritis. No swelling. No gout.  NEUROLOGIC: No numbness, tingling, or ataxia. No seizure-type activity.  PSYCHIATRIC: No anxiety. No insomnia. No ADD.       Medications/Allergies Reviewed Medications/Allergies reviewed   VITAL SIGNS/ANCILLARY NOTES: **Vital Signs.: Filed Vitals:   07/16/14 1253  BP: 128/58  Pulse: 80  Temp: 98.1 F (36.7 C)  Resp: 18        Physical Exam:  GEN well developed, disheveled.   HEENT pink conjunctivae, hearing intact to voice, dry oral mucosa,    NECK supple  No masses  thyroid not tender   RESP normal resp effort , mild wheeze, no use of accessory muscles, nasal canula on use.   CARD regular rate  murmur present  LE edema present  no JVD  + 1 edema b/l   ABD denies tenderness  soft  normal BS  no Abdominal Bruits  no Adominal Mass   GU foley catheter in place  clear yellow urine draining   EXTR negative cyanosis/clubbing, positive edema, + 1 edema b/l   SKIN normal to palpation,  No rashes, No ulcers   NEURO negative tremor, moves all limbs, follows comands.   PSYCH Alert, oriented.   ASSESSMENT/PLAN:  Assessment/Plan:  Orders/Results reviewed Orders reviewed and updated, Laboratory results reviewed, Radiology results reviewed, Vital Signs reviewed   Assessment/Plan 74 yo male w/ hx of COPD, CHF, hx of CAD, hx of RA, BPH came into hospital due to shortness of breath, LE edema and noted to be in acute resp. failure.   * Acute Resp. Failure w/ hpoxia,hypercarbia - due to combination of CHF, COPD Exacerbation.  IV steroids, nebs, vent support initially. Extubated 07/13/14  Bipap as needed. Appreicated help of Pulmonary.  Encouraged pt to use cpap at night.  will discharge with oxygen.  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  Initially given IV lasix- responded well- stopped now.  * COPD Exacerbation - cont. IV steroids, duonebs, empirical abx.  - on bipap at night. - will need oxygen to continue for now on d/c.  * BPH - cont. Flomax.   * hx of RA - cont. Plaquenil, Folic Acid   (continued) Need rehab placement. Son want him to go near to his house in [place].   Social worker is working on that.   DVT Prophylaxis Lovenox   Discharge Planning home   Code Status Full Code   Critical Care No, Total patient care time:, 35 min   Case Discussed With Nursing, CSW, Case manager.

## 2014-07-16 NOTE — Discharge Summary (Addendum)
Physician Discharge Summary  Andre Hayes ZOX:[id number] DOB: 1941/01/16 DOA: 07/09/2014  PCP: Lauro Regulus., MD  Admit date: 07/09/2014 Discharge date: 07/16/2014  Time spent: 40 minutes  Recommendations for Outpatient Follow-up: PCP in 1-2 weeks.  Discharge Diagnoses:  Active Problems:   Acute respiratory failure   COPD exacerbation   Acute on chronic diastolic congestive heart failure   OSA on CPAP   Discharge Condition: stable  Diet recommendation: low sodium diet.  Ice chips PRN after oral care;Dysphagia 2 (Fine chop);Nectar    Medication Administration: Crushed with puree   Advise to have swallow evaluation in 1 week to reassess diet plan.  Oxygen- 2 ltr/min, and use Cpap at night  Filed Weights   07/14/14 1831 07/15/14 0429 07/16/14 0451  Weight: 245 lb 4.8 oz (111.267 kg) 243 lb 6.4 oz (110.406 kg) 242 lb (109.77 kg)    History of present illness:    A 74 year old Caucasian male patient with history of CAD status post PCI and COPD who presents to the Emergency Room complaining of progressively worsening shortness of breath over 3 days. The patient mentions that he has had chronically worsening shortness of breath, but over the last 3 days this has come to a point where he can barely breathe at rest, unable to speak. Here in the Emergency Room, he has been found to have sats of 60% on room air. He has also had orthopnea and lower extremity edema which is worsening. He also has clear productive sputum. No chest pain. His Lasix was decreased to half a dose 2 months prior by his PCP, although the patient is not aware why this was decreased.   Here the patient is needing BiPAP support. A chest x-ray shows CHF and is being admitted for acute respiratory failure.   Hospital Course:   74 yo male w/ hx of COPD, CHF, hx of CAD, hx of RA, BPH came into hospital due to shortness of breath, LE edema and noted to be in acute resp. failure.   * Acute Resp. Failure w/  hpoxia,hypercarbia - due to combination of CHF, COPD Exacerbation.  IV steroids, nebs, vent support initially. Extubated 07/13/14 Bipap as needed. Appreicated help of Pulmonary. Encouraged pt to use cpap at night. will discharge with oxygen.  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  Initially given IV lasix- responded well- stopped now.  * COPD Exacerbation - cont. IV steroids, duonebs, empirical abx.  - on bipap at night. - will need oxygen to continue for now on d/c.  * BPH - cont. Flomax.   * hx of RA - cont. Plaquenil, Folic Acid  Procedures: Intubated for respi distress.  Consultations:  Pulmonary- Dr.Mungal  Discharge Exam: Filed Vitals:   07/16/14 1253  BP: 128/58  Pulse: 80  Temp: 98.1 F (36.7 C)  Resp: 18   Discharge Instructions    Current Discharge Medication List    START taking these medications   Details  fluticasone (FLOVENT HFA) 44 MCG/ACT inhaler Inhale 1 puff into the lungs 2 (two) times daily. Qty: 1 Inhaler, Refills: 12    levofloxacin (LEVAQUIN) 500 MG tablet Take 1 tablet (500 mg total) by mouth daily. Qty: 3 tablet, Refills: 0    predniSONE (STERAPRED UNI-PAK 21 TAB) 10 MG (21) TBPK tablet Take 1 tablet (10 mg total) by mouth daily. Start 6 tabs daily for first day- taper 10 mg daily until complete. Qty: 21 tablet, Refills: 0    QUEtiapine (SEROQUEL) 25 MG tablet Take  1 tablet (25 mg total) by mouth at bedtime. Qty: 20 tablet, Refills: 0      CONTINUE these medications which have NOT CHANGED   Details  albuterol (PROVENTIL HFA;VENTOLIN HFA) 108 (90 BASE) MCG/ACT inhaler Inhale 1-2 puffs into the lungs 4 (four) times daily as needed for shortness of breath.     aspirin EC 81 MG tablet Take 81 mg by mouth daily.    clopidogrel (PLAVIX) 75 MG tablet Take 75 mg by mouth daily.    docusate sodium (COLACE) 100 MG capsule Take 100 mg by mouth daily as needed for mild constipation.     folic acid (FOLVITE) 1 MG tablet Take  1 mg by mouth daily.    furosemide (LASIX) 20 MG tablet Take 20 mg by mouth daily.    hydroxychloroquine (PLAQUENIL) 200 MG tablet Take 200 mg by mouth 2 (two) times daily.     tamsulosin (FLOMAX) 0.4 MG CAPS capsule Take 0.4 mg by mouth 4 (four) times daily.       STOP taking these medications     albuterol (PROVENTIL) (2.5 MG/3ML) 0.083% nebulizer solution      PRESCRIPTION MEDICATION        Allergies  Allergen Reactions  . Erythromycin Base Rash   Follow-up Information    Follow up In 1 week.      Need to follow with Podiatry clinic for toe nail care.   Follow with Pulmonologist in 2-3 weeks. Plan is to move to [facility]- so Need to find a Pulmonologist there with help of PMD. If decide to stay here- follow with Dr. Dema Severin  Follow up with Stephanie Acre, MD In 1 month.    Specialty: Internal Medicine   Contact information:   [address]  [address]  [place] [address]  [phone]       The results of significant diagnostics from this hospitalization (including imaging, microbiology, ancillary and laboratory) are listed below for reference.    Significant Diagnostic Studies: Dg Abd 1 View  07/11/2014   CLINICAL DATA:  Orogastric tube placement  EXAM: ABDOMEN - 1 VIEW  COMPARISON:  None.  FINDINGS: Feeding tube is coiled in the fundus of the stomach. Generalized bowel distention. No free intraperitoneal gas.  IMPRESSION: Feeding tube coiled in the fundus of the stomach.   Electronically Signed   By: Jolaine Click M.D.   On: 07/11/2014 10:38   Dg Chest Port 1 View  07/12/2014   CLINICAL DATA:  Intubated.  Possible edema on the prior study.  EXAM: PORTABLE CHEST - 1 VIEW  COMPARISON:  07/10/2014  FINDINGS: Endotracheal tube is unchanged, with tip well above the carina. Enteric tube courses into the left upper abdomen with tip not imaged. Cardiac silhouette remains upper limits of normal in size. Thoracic aortic calcification is noted. Pulmonary vascular  congestion is stable. Interstitial markings remain mildly prominent bilaterally. Slightly increased density is present in the right lung base. No definite pleural effusion or pneumothorax is identified.  IMPRESSION: 1. Similar appearance of pulmonary vascular congestion and possible mild interstitial edema. 2. Slightly increased right basilar opacity, likely atelectasis.   Electronically Signed   By: Sebastian Ache   On: 07/12/2014 11:32   Dg Chest Port 1 View  07/10/2014   CLINICAL DATA:  Endotracheal tube placement.  Initial encounter.  EXAM: PORTABLE CHEST - 1 VIEW  COMPARISON:  Chest radiograph performed 07/09/2014  FINDINGS: The patient's endotracheal tube is seen ending 4-5 cm above the carina.  Vascular congestion  is noted. Mildly interstitial markings raise concern for mild interstitial edema, improved from the prior study. No definite pleural effusion or pneumothorax is seen.  The cardiomediastinal silhouette is borderline normal in size. No acute osseous abnormalities are identified.  IMPRESSION: 1. Endotracheal tube seen ending 4-5 cm above the carina. 2. Vascular congestion noted. Mildly increased interstitial markings raise concern for mild interstitial edema, improved from the prior study.   Electronically Signed   By: Roanna Raider M.D.   On: 07/10/2014 03:16   Dg Chest Port 1 View  07/09/2014   CLINICAL DATA:  Shortness of breath  EXAM: PORTABLE CHEST - 1 VIEW  COMPARISON:  02/12/2013  FINDINGS: When accounting for lower mediastinal fat, heart size is likely within normal limits. Negative aortic and hilar contours.  Interstitial coarsening without overt edema. No focal pneumonia or collapse. No effusion or pneumothorax. The previously noted left upper lobe nodule is not seen, likely obscured by the left anterior second rib.  IMPRESSION: Pulmonary venous congestion without edema.   Electronically Signed   By: Marnee Spring M.D.   On: 07/09/2014 10:09    Microbiology: No results found for  this or any previous visit (from the past 240 hour(s)).   Labs: Basic Metabolic Panel:  Recent Labs Lab 07/11/14 0604 07/12/14 0559 07/13/14 9357 07/14/14 0425 07/15/14 0603  NA 139 138 135 140 141  K 3.5 3.6 3.5 3.8 4.0  CL 96* 100* 103 105 104  CO2 35* 29 26 30 31   GLUCOSE 104* 99 120* 93 98  BUN 34* 34* 30* 28* 34*  CREATININE 1.02 0.94 0.83 0.84 0.73  CALCIUM 9.0 8.9 8.8* 9.0 9.3  MG  --   --   --  2.3 2.2  PHOS  --   --   --  2.7 2.0*   Liver Function Tests: No results for input(s): AST, ALT, ALKPHOS, BILITOT, PROT, ALBUMIN in the last 168 hours. No results for input(s): LIPASE, AMYLASE in the last 168 hours. No results for input(s): AMMONIA in the last 168 hours. CBC:  Recent Labs Lab 07/12/14 1109 07/13/14 0632 07/15/14 0603  WBC 12.2* 10.4 10.9*  NEUTROABS 10.4* 7.8*  --   HGB 15.3 15.2 15.8  HCT 49.6 47.4 50.6  MCV 78* 77* 78.3*  PLT 176 162 183   Cardiac Enzymes: No results for input(s): CKTOTAL, CKMB, CKMBINDEX, TROPONINI in the last 168 hours. BNP: BNP (last 3 results) No results for input(s): BNP in the last 8760 hours.  ProBNP (last 3 results) No results for input(s): PROBNP in the last 8760 hours.  CBG:  Recent Labs Lab 07/15/14 1209 07/15/14 1722 07/16/14 0005 07/16/14 0513 07/16/14 1254  GLUCAP 205* 117* 88 190* 170*       Signed:  Annitta [facility] 07/16/2014, 2:08 PM

## 2014-07-16 NOTE — Progress Notes (Signed)
Physical Therapy Treatment Patient Details Name: Andre Hayes MRN: [id number] DOB: 04/03/1940 Today's Date: 07/16/2014    History of Present Illness 74 yo male with independent life and driving was admitted to hosp with respiratory crisis with CHF, intubated and then sedated, extubated 4/30 and now referred to OT. PMHx: CHF, RA, CAD with stenting, HLD, COPD    PT Comments    Pt is doing much more today, more alert and safer with his transfers and sitting control.  Has been able to get to chair with one person but to stand would require 2 person assist.    Follow Up Recommendations  CIR;Supervision/Assistance - 24 hour     Equipment Recommendations  None recommended by PT    Recommendations for Other Services Rehab consult     Precautions / Restrictions Precautions Precautions: Fall (tlemetry) Restrictions Weight Bearing Restrictions: No    Mobility  Bed Mobility Overal bed mobility: Needs Assistance Bed Mobility: Supine to Sit Rolling: Max assist   Supine to sit: Max assist     General bed mobility comments: assists with LE's but needs a prompt to continue the effort  Transfers Overall transfer level: Needs assistance Equipment used: 1 person hand held assist Transfers: Lateral/Scoot Transfers Sit to Stand: Max assist;From elevated surface (to attempt)        Lateral/Scoot Transfers: Mod assist;Max assist General transfer comment: dense repetition of cues  Ambulation/Gait             General Gait Details: unable   Stairs            Wheelchair Mobility    Modified Rankin (Stroke Patients Only)       Balance Overall balance assessment: Needs assistance Sitting-balance support: Feet supported;Bilateral upper extremity supported Sitting balance-Leahy Scale: Poor Sitting balance - Comments: lists to the left Postural control: Left lateral lean                          Cognition Arousal/Alertness: Awake/alert Behavior During  Therapy: WFL for tasks assessed/performed;Flat affect Overall Cognitive Status: Impaired/Different from baseline Area of Impairment: Attention;Following commands;Safety/judgement;Awareness;Problem solving;Memory Orientation Level: Disoriented to;Time;Situation;Place Current Attention Level: Alternating Memory: Decreased recall of precautions;Decreased short-term memory Following Commands: Follows one step commands inconsistently Safety/Judgement: Decreased awareness of safety;Decreased awareness of deficits Awareness: Intellectual Problem Solving: Slow processing;Difficulty sequencing;Requires verbal cues;Requires tactile cues General Comments: More clear and alert today but poor recall of room changes, thought he was in another facility yesterday    Exercises      General Comments General comments (skin integrity, edema, etc.): Pt not able to fully stand and had to use a sliding transfer techniique, which made him more stable and just had to repeat the instructions and the steps      Pertinent Vitals/Pain Pain Assessment: No/denies pain    Home Living                      Prior Function            PT Goals (current goals can now be found in the care plan section) Acute Rehab PT Goals Patient Stated Goal: none stated Progress towards PT goals: Progressing toward goals    Frequency  Min 2X/week    PT Plan Current plan remains appropriate    Co-evaluation         SLP goals addressed during session: Swallowing   End of Session Equipment Utilized During Treatment: Oxygen;Gait belt Activity Tolerance:  Patient limited by fatigue Patient left: in chair;with call bell/phone within reach;with chair alarm set     Time: [phone] PT Time Calculation (min) (ACUTE ONLY): 33 min  Charges:  $Therapeutic Activity: 23-37 mins                    G Codes:      Ivar Drape 08-12-2014, 5:44 PM   Samul Dada, PT MS Acute Rehab Dept. Number: [phone]

## 2014-07-16 NOTE — Progress Notes (Signed)
Date: 07/16/2014,   MRN# [id number] Andre Hayes [birth year] Code Status:     Code Status Orders        Start     Ordered   07/13/14 1352  Full code   Continuous     07/13/14 1405       CC: follow up visit  HPI: On the regular floor. Son is angry of his care, unable to continue to see him   PMHX:   Past Medical History  Diagnosis Date  . COPD (chronic obstructive pulmonary disease)   . Hyperlipidemia   . Pneumonia 05/02/14    not hospitalized  . Rheumatoid arthritis   . Pulmonary disease   . CAD (coronary artery disease)    Surgical Hx:  Past Surgical History  Procedure Laterality Date  . Coronary angioplasty with stent placement     Family Hx:  No family history on file. Social Hx:   History  Substance Use Topics  . Smoking status: Former Smoker -- 1.00 packs/day for 10 years    Types: Cigarettes    Start date: 06/17/1954    Quit date: 06/16/1964  . Smokeless tobacco: Never Used  . Alcohol Use: No   Medication:    Home Medication:  Current Outpatient Rx  Name  Route  Sig  Dispense  Refill  . fluticasone (FLOVENT HFA) 44 MCG/ACT inhaler   Inhalation   Inhale 1 puff into the lungs 2 (two) times daily.   1 Inhaler   12   . levofloxacin (LEVAQUIN) 500 MG tablet   Oral   Take 1 tablet (500 mg total) by mouth daily.   3 tablet   0   . predniSONE (STERAPRED UNI-PAK 21 TAB) 10 MG (21) TBPK tablet   Oral   Take 1 tablet (10 mg total) by mouth daily. Start 6 tabs daily for first day- taper 10 mg daily until complete.   21 tablet   0   . QUEtiapine (SEROQUEL) 25 MG tablet   Oral   Take 1 tablet (25 mg total) by mouth at bedtime.   20 tablet   0     Current Medication: @CURMEDTAB @   Allergies:  Erythromycin base  Review of Systems: Gen:  Denies  fever, sweats, chills HEENT: Denies blurred vision, double vision, ear pain, eye pain, hearing loss, nose bleeds, sore throat Cvc:  No dizziness, chest pain or heaviness Resp:    Gi: Denies swallowing  difficulty, stomach pain, nausea or vomiting, diarrhea, constipation, bowel incontinence Gu:  Denies bladder incontinence, burning urine Ext:   No Joint pain, stiffness or swelling Skin: No skin rash, easy bruising or bleeding or hives Endoc:  No polyuria, polydipsia , polyphagia or weight change Psych: No depression, insomnia or hallucinations  Other:  All other systems negative  Physical Examination:   VS: BP 128/58 mmHg  Pulse 80  Temp(Src) 98.1 F (36.7 C) (Oral)  Resp 18  Ht 5' 11.97" (1.828 m)  Wt 242 lb (109.77 kg)  BMI 32.85 kg/m2  SpO2 93%  General Appearance: No distress  Neuro: without focal findings, mental status, speech normal, alert and oriented, cranial nerves 2-12 intact, reflexes normal and symmetric, sensation grossly normal  HEENT: PERRLA, EOM intact, no ptosis, no other lesions noticed, Mallampati: Pulmonary:.No wheezing, No rales  Sputum Production:   Cardiovascular:  Normal S1,S2.  No m/r/g.  Abdominal aorta pulsation normal.    Abdomen:Benign, Soft, non-tender, No masses, hepatosplenomegaly, No lymphadenopathy Endoc: No evident thyromegaly, no signs of acromegaly or  Cushing features Skin:   warm, no rashes, no ecchymosis  Extremities: normal, no cyanosis, clubbing, no edema, warm with normal capillary refill. Other findings:   Labs results:   Recent Labs     07/14/14  0425  07/15/14  0603  HGB   --   15.8  HCT   --   50.6  MCV   --   78.3*  WBC   --   10.9*  BUN  28*  34*  CREATININE  0.84  0.73  GLUCOSE  93  98  CALCIUM  9.0  9.3  ,    No results for input(s): PH in the last 72 hours.  Invalid input(s): PCO2, PO2, BASEEXCESS, BASEDEFICITE, TFT  Culture results:     Rad results:   No results found.    EKG:     Other:   Assessment and Plan: Seem to have responded, for rehab which I concur with. Will defer care to his pcp and to find another pulmonologist   I have personally obtained a history, examined the patient, evaluated  laboratory and imaging results, formulated the assessment and plan and placed orders.  The Patient requires high complexity decision making for assessment and support, frequent evaluation and titration of therapies, application of advanced monitoring technologies and extensive interpretation of multiple databases.   Smiley Birr,M.D. Pulmonary & Critical care Medicine [facility]

## 2014-07-16 NOTE — Progress Notes (Signed)
Pt resting in bed with Bipap on. Son at bedside until about 3am. Son did speak with MD. Pt tolerating thickened liquids. Pt was alertx4 when he woke up in the middle of night. States he felt better. Pt now resting in bed. Will continue to monitor.

## 2014-07-16 NOTE — Progress Notes (Signed)
Rehab admissions - I have reviewed pt's case and noted pt's progress with therapy. At this time, we are recommending that skilled nursing be pursued in light of pt's activity tolerance and will likely need further care beyond acute rehab. I called and updated Nann, case manager who stated they are planning for SNF.  I will now sign off pt's case. Thanks.  Juliann Mule, PT Rehabilitation Admissions Coordinator [phone]

## 2014-07-16 NOTE — Discharge Instructions (Signed)
Diet- low sodium diet Oxygen- 2 ltr/ min ia nasal canula- continuous- and reevaluate in 1 week for continued use.  Must use cpap at night.  Activities as tolerated. Follow with PMD in 1-2 week.

## 2014-07-17 ENCOUNTER — Encounter: Payer: Self-pay | Admitting: Internal Medicine

## 2014-07-17 ENCOUNTER — Ambulatory Visit: Payer: Commercial Managed Care - HMO | Admitting: *Deleted

## 2014-07-17 ENCOUNTER — Inpatient Hospital Stay: Payer: Commercial Managed Care - HMO

## 2014-07-17 LAB — GLUCOSE, CAPILLARY
GLUCOSE-CAPILLARY: 125 mg/dL — AB (ref 70–99)
Glucose-Capillary: 153 mg/dL — ABNORMAL HIGH (ref 70–99)
Glucose-Capillary: 132 mg/dL — ABNORMAL HIGH (ref 70–99)
Glucose-Capillary: 137 mg/dL — ABNORMAL HIGH (ref 70–99)
Glucose-Capillary: 121 mg/dL — ABNORMAL HIGH (ref 70–99)
Glucose-Capillary: 168 mg/dL — ABNORMAL HIGH (ref 70–99)

## 2014-07-17 MED ORDER — MENTHOL 3 MG MT LOZG: 1.0000 | LOZENGE | OROMUCOSAL | Status: DC | PRN

## 2014-07-17 NOTE — Care Management Note (Signed)
Case Management Note  Patient Details  Name: Andre Hayes MRN: [id number] Date of Birth: [birth year]  Subjective/Objective:                    Action/Plan:   Expected Discharge Date:  07/17/14               Expected Discharge Plan:     In-House Referral:     Discharge planning Services     Post Acute Care Choice:    Choice offered to:     DME Arranged:    DME Agency:     HH Arranged:    HH Agency:     Status of Service:     Medicare Important Message Given:   07/17/2014 Date Medicare IM Given:   yes- to patient Medicare IM give by:   Ermalene Searing RN Date Additional Medicare IM Given:    Additional Medicare Important Message give by:     If discussed at Long Length of Stay Meetings, dates discussed:    Additional Comments:  Eber Hong, RN 07/17/2014, 10:10 AM

## 2014-07-17 NOTE — Progress Notes (Signed)
SUBJECTIVE:  Review of Systems:  Subjective/Chief Complaint Presented with SOB, orthopnea and edema. Was on Bipap, worsened and intubated in CCU 4/27 2 AM. Extubated 07/13/14.  Had episode of SOB 07/14/14 evening- but after starting on Bipap overnight- he is fine now.   Review of sysrems     CONSTITUTIONAL: No documented fever. No fatigue, weakness. No weight gain, no weight loss.  EYES: No blurry or double vision.  ENT: No tinnitus. No postnasal drip. No redness of the oropharynx.  RESPIRATORY: No cough, mild wheeze, no hemoptysis. No dyspnea. uses 2 ltr oxygen. CARDIOVASCULAR: no chest pain. No orthopnea. No palpitations. No syncope.  GASTROINTESTINAL: No nausea, no vomiting or diarrhea. No abdominal pain. No melena or hematochezia.  GENITOURINARY: No dysuria or hematuria.  ENDOCRINE: No polyuria or nocturia. No heat or cold intolerance.  HEMATOLOGY: No anemia. No bruising. No bleeding.  INTEGUMENTARY: No rashes. No lesions.  MUSCULOSKELETAL: No arthritis. No swelling. No gout.  NEUROLOGIC: No numbness, tingling, or ataxia. No seizure-type activity.  PSYCHIATRIC: No anxiety. No insomnia.       Medications/Allergies Reviewed Medications/Allergies reviewed   VITAL SIGNS/ANCILLARY NOTES: **Vital Signs.: Filed Vitals:   07/17/14 1100  BP: 116/55  Pulse: 68  Temp: 97.6 F (36.4 C)  Resp: 20        Physical Exam:  GEN well developed, disheveled.   HEENT pink conjunctivae, hearing intact to voice, dry oral mucosa,    NECK supple  No masses  thyroid not tender   RESP normal resp effort , mild wheeze, no use of accessory muscles, nasal canula on use.   CARD regular rate  murmur present  LE edema present  no JVD  + 1 edema b/l   ABD denies tenderness  soft  normal BS  no Abdominal Bruits  no Adominal Mass   GU foley catheter in place  clear yellow urine draining   EXTR negative cyanosis/clubbing, positive edema, + 1 edema b/l   SKIN normal to palpation, No  rashes, No ulcers   NEURO negative tremor, moves all limbs, follows comands.   PSYCH Alert, oriented.   ASSESSMENT/PLAN:  Assessment/Plan:  Orders/Results reviewed Orders reviewed and updated, Laboratory results reviewed, Radiology results reviewed, Vital Signs reviewed   Assessment/Plan 74 yo male w/ hx of COPD, CHF, hx of CAD, hx of RA, BPH came into hospital due to shortness of breath, LE edema and noted to be in acute resp. failure.   * Acute Resp. Failure w/ hpoxia,hypercarbia - due to combination of CHF, COPD Exacerbation.  IV steroids, nebs, vent support initially. Extubated 07/13/14  Bipap at night, Appreicated help of Pulmonary.  Encouraged pt to use cpap at night.  will discharge with oxygen to rehab.  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  Initially given IV lasix- responded well- stopped now.  * COPD Exacerbation - cont. IV steroids,  duonebs, empirical abx.  - on bipap at night. - will need oxygen to continue for now on d/c. - Also did swallow eval and barrium swallow today to decide d/c diet.  * BPH - cont. Flomax.   * hx of RA - cont. Plaquenil, Folic Acid   (continued) Need rehab placement. Son want him to go near to his house in [place].   Social worker spoke to him about options.   I spoke to family today again on phone- now they agreed to place here in rehab, awaited insurance authorization.    DVT Prophylaxis Lovenox   Discharge Planning home  Code Status Full Code   Critical Care No, Total patient care time:, 35 min   Case Discussed With Nursing, CSW, Case manager.

## 2014-07-17 NOTE — Clinical Social Work Placement (Signed)
   CLINICAL SOCIAL WORK PLACEMENT  NOTE  Date:  07/17/2014  Patient Details  Name: Andre Hayes MRN: [id number] Date of Birth: 31-Oct-1940  Clinical Social Work is seeking post-discharge placement for this patient at the Skilled  Nursing Facility level of care (*CSW will initial, date and re-position this form in  chart as items are completed):  Yes   Patient/family provided with [facility] Clinical Social Work Department's list of facilities offering this level of care within the geographic area requested by the patient (or if unable, by the patient's family).  Yes   Patient/family informed of their freedom to choose among providers that offer the needed level of care, that participate in Medicare, Medicaid or managed care program needed by the patient, have an available bed and are willing to accept the patient.  Yes   Patient/family informed of [facility]'s ownership interest in [facility] and [facility], as well as of the fact that they are under no obligation to receive care at these facilities.  PASRR submitted to EDS on 07/15/14     PASRR number received on 07/15/14     Existing PASRR number confirmed on       FL2 transmitted to all facilities in geographic area requested by pt/family on 07/15/14     FL2 transmitted to all facilities within larger geographic area on       Patient informed that his/her managed care company has contracts with or will negotiate with certain facilities, including the following:            Patient/family informed of bed offers received.  Patient chooses bed at [facility]     Physician recommends and patient chooses bed at      Patient to be transferred to   on  .  Patient to be transferred to facility by       Patient family notified on   of transfer.  Name of family member notified:        PHYSICIAN       Additional Comment:   CSW spoke to pt's son Daiden, to notify him that pt would need to DC to a local  facility and then move to [place] once he was able to get insurance coverage in that area.  CSW has sent in H&P notes, and consults to [facility] for them to start auth.  CSW will f/u with facility tomorrow as Berkley Harvey takes 24 hours with [employer] _______________________________________________ Chauncy Passy, LCSW 07/17/2014, 3:52 PM

## 2014-07-17 NOTE — Procedures (Signed)
Objective Swallowing Evaluation:    Patient Details  Name: Andre Hayes MRN: [id number] Date of Birth: September 15, 1940  Today's Date: 07/17/2014 Time: SLP Start Time (ACUTE ONLY): 1030-SLP Stop Time (ACUTE ONLY): 1130 SLP Time Calculation (min) (ACUTE ONLY): 60 min  Past Medical History:  Past Medical History  Diagnosis Date  . COPD (chronic obstructive pulmonary disease)   . Hyperlipidemia   . Pneumonia 05/02/14    not hospitalized  . Rheumatoid arthritis   . Pulmonary disease   . CAD (coronary artery disease)    Past Surgical History:  Past Surgical History  Procedure Laterality Date  . Coronary angioplasty with stent placement     HPI:  Pt was more alert, awake. He was verbally conversive w/ SLP w/ less confusion noted today than at initial assessment at BSE. Pt appropriately followed instructions w/ few, intermittent verbal cues given. He asked if this eval would allow him "to drink water again?"  Assessment / Plan / Recommendation CHL IP CLINICAL IMPRESSIONS 07/17/2014  Therapy Diagnosis WFL  Clinical Impression pt appeared to present w/ an adequate oropharyngeal swallow function w/ no laryngeal penetration or aspiration noted during this study following general aspiration precautions. Pt demo. a timely pharyngeal swallow initation and adequate oral phsae to transfer and clear bolus material.    Recommended Diet: NDD3 (mech soft) w/ Thin liquids; general aspiration precautions; Meds in Puree (whole as able) for safer swallowing at this time     CHL IP TREATMENT RECOMMENDATION 07/17/2014  Treatment Recommendations Therapy as outlined in treatment plan below        CHL IP OTHER RECOMMENDATIONS 07/17/2014  Recommended Consults (None)  Oral Care Recommendations Oral care BID  Other Recommendations (None)     No flowsheet data found.   CHL IP FREQUENCY AND DURATION 07/17/2014  Speech Therapy Frequency (ACUTE ONLY) (None)  Treatment Duration 1 week     Pertinent Vitals/Pain  denied    SLP Swallow Goals No flowsheet data found.  No flowsheet data found.    CHL IP REASON FOR REFERRAL 07/17/2014  Reason for Referral Objectively evaluate swallowing function     CHL IP ORAL PHASE 07/17/2014  Lips (None)  Tongue (None)  Mucous membranes (None)  Nutritional status (None)  Other (None)  Oxygen therapy (None)  Oral Phase WFL  Oral - Pudding Teaspoon (None)  Oral - Pudding Cup (None)  Oral - Honey Teaspoon (None)  Oral - Honey Cup (None)  Oral - Honey Syringe (None)  Oral - Nectar Teaspoon (None)  Oral - Nectar Cup (None)  Oral - Nectar Straw (None)  Oral - Nectar Syringe (None)  Oral - Ice Chips (None)  Oral - Thin Teaspoon (None)  Oral - Thin Cup (None)  Oral - Thin Straw (None)  Oral - Thin Syringe (None)  Oral - Puree (None)  Oral - Mechanical Soft (None)  Oral - Regular (None)  Oral - Multi-consistency (None)  Oral - Pill (None)  Oral Phase - Comment (None)      CHL IP PHARYNGEAL PHASE 07/17/2014  Pharyngeal Phase WFL  Pharyngeal - Pudding Teaspoon (None)  Penetration/Aspiration details (pudding teaspoon) (None)  Pharyngeal - Pudding Cup (None)  Penetration/Aspiration details (pudding cup) (None)  Pharyngeal - Honey Teaspoon (None)  Penetration/Aspiration details (honey teaspoon) (None)  Pharyngeal - Honey Cup (None)  Penetration/Aspiration details (honey cup) (None)  Pharyngeal - Honey Syringe (None)  Penetration/Aspiration details (honey syringe) (None)  Pharyngeal - Nectar Teaspoon (None)  Penetration/Aspiration details (nectar teaspoon) (None)  Pharyngeal -  Nectar Cup (None)  Penetration/Aspiration details (nectar cup) (None)  Pharyngeal - Nectar Straw (None)  Penetration/Aspiration details (nectar straw) (None)  Pharyngeal - Nectar Syringe (None)  Penetration/Aspiration details (nectar syringe) (None)  Pharyngeal - Ice Chips (None)  Penetration/Aspiration details (ice chips) (None)  Pharyngeal - Thin Teaspoon (None)   Penetration/Aspiration details (thin teaspoon) (None)  Pharyngeal - Thin Cup (None)  Penetration/Aspiration details (thin cup) (None)  Pharyngeal - Thin Straw (None)  Penetration/Aspiration details (thin straw) (None)  Pharyngeal - Thin Syringe (None)  Penetration/Aspiration details (thin syringe') (None)  Pharyngeal - Puree (None)  Penetration/Aspiration details (puree) (None)  Pharyngeal - Mechanical Soft (None)  Penetration/Aspiration details (mechanical soft) (None)  Pharyngeal - Regular (None)  Penetration/Aspiration details (regular) (None)  Pharyngeal - Multi-consistency (None)  Penetration/Aspiration details (multi-consistency) (None)  Pharyngeal - Pill (None)  Penetration/Aspiration details (pill) (None)  Pharyngeal Comment (No Data)     CHL IP CERVICAL ESOPHAGEAL PHASE 07/17/2014  Cervical Esophageal Phase WFL  Pudding Teaspoon (None)  Pudding Cup (None)  Honey Teaspoon (None)  Honey Cup (None)  Honey Straw (None)  Nectar Teaspoon (None)  Nectar Cup (None)  Nectar Straw (None)  Nectar Sippy Cup (None)  Thin Teaspoon (None)  Thin Cup (None)  Thin Straw (None)  Thin Sippy Cup (None)  Cervical Esophageal Comment (None)    No flowsheet data found.         Watson,Katherine 07/17/2014, 11:31 AM

## 2014-07-17 NOTE — Progress Notes (Signed)
Occupational Therapy Treatment Patient Details Name: Andre Hayes MRN: [id number] DOB: [birth year] Today's Date: 07/17/2014    History of present illness 74 yo male with independent life and driving was admitted to hosp with respiratory crisis with CHF, intubated and then sedated, extubated 4/30 and now referred to OT. PMHx: CHF, RA, CAD with stenting, HLD, COPD   OT comments  Pt seen sitting up in bed for BUE exercises.  He aroused to voice but was not oriented or able to sustain focus on tasks after 10 minutes and needed moderate cues to attend to elbow flexion and extension exercises to work on increasing functional strength and endurance for ADLs.  Stopped exercises after 10 minutes due to fatigue and pt talking and not making sense.    Follow Up Recommendations       Equipment Recommendations       Recommendations for Other Services      Precautions / Restrictions Precautions Precautions: Fall Restrictions Weight Bearing Restrictions: No       Mobility Bed Mobility Overal bed mobility: Needs Assistance Bed Mobility: Supine to Sit Rolling: Mod assist   Supine to sit: Mod assist     General bed mobility comments: Mainly help for LE's  Transfers Overall transfer level: Needs assistance Equipment used: 2 person hand held assist;Rolling walker (2 wheeled) Transfers: Sit to/from Stand;Lateral/Scoot Transfers Sit to Stand: Max assist;+2 physical assistance;+2 safety/equipment        Lateral/Scoot Transfers: Max assist General transfer comment: repetition of information with pt eventually completely following    Balance Overall balance assessment: Needs assistance Sitting-balance support: Feet supported;Bilateral upper extremity supported Sitting balance-Leahy Scale: Fair Sitting balance - Comments: lists to the left Postural control: Posterior lean Standing balance support: Bilateral upper extremity supported Standing balance-Leahy Scale: Poor Standing  balance comment: reminders for controlling walker and cues for upright standing                   ADL                                                Vision                     Perception     Praxis      Cognition   Behavior During Therapy: Flat affect (fluctuating with alert and drowsy this session) Overall Cognitive Status: Impaired/Different from baseline Area of Impairment: Memory;Safety/judgement;Problem solving Orientation Level: Disoriented to;Time;Place;Situation Current Attention Level: Alternating Memory: Decreased recall of precautions;Decreased short-term memory  Following Commands: Follows one step commands inconsistently Safety/Judgement: Decreased awareness of safety;Decreased awareness of deficits Awareness: Intellectual Problem Solving: Slow processing;Difficulty sequencing;Requires verbal cues;Requires tactile cues General Comments: continues to clear but needs close supervision of his safety    Extremity/Trunk Assessment               Exercises General Exercises - Upper Extremity Elbow Flexion: AROM;Both;10 reps Elbow Extension: AROM;10 reps;Both;Seated General Exercises - Lower Extremity Ankle Circles/Pumps: AROM;Both;5 reps Short Arc Quad: AROM;Both;10 reps Long Arc Quad: AROM;Both;10 reps Hip ABduction/ADduction: Both;Strengthening;10 reps Hip Flexion/Marching: AROM;Both;10 reps   Shoulder Instructions       General Comments      Pertinent Vitals/ Pain       Pain Assessment: No/denies pain  Home Living  Prior Functioning/Environment              Frequency       Progress Toward Goals  OT Goals(current goals can now be found in the care plan section)  Progress towards OT goals: Progressing toward goals  Acute Rehab OT Goals Patient Stated Goal: To walk  Plan      Co-evaluation                 End of Session Equipment  Utilized During Treatment: Oxygen   Activity Tolerance Patient limited by fatigue   Patient Left in bed;with call bell/phone within reach;with bed alarm set   Nurse Communication          Time: 1545-1600 OT Time Calculation (min): 15 min  Charges: OT General Charges $OT Visit: 1 Procedure OT Treatments $Therapeutic Exercise: 8-22 mins  Wofford,Susan 07/17/2014, 4:02 PM   Susanne Borders, OTR/L

## 2014-07-17 NOTE — Clinical Social Work Note (Signed)
CSW spoke to pt's son Zyrell [phone].  Per pt's son, and pt they would like to DC to a SNF facility in [facility], however this is out of network for [employer].  This was confirmed with insurance company.  In order for it to be covered out of network, pt's son needs to obtain a medical waiver from from the insurance Co. [facility]) and have that processed which could take another 24 hours.  Auth for placement also takes 24 hrs and pt has not chosen a facility yet.  CSW will attempt to explain this to pt's son again.

## 2014-07-17 NOTE — Progress Notes (Signed)
Physical Therapy Treatment Patient Details Name: Andre Hayes MRN: [id number] DOB: [birth year] Today's Date: 07/17/2014    History of Present Illness 74 yo male with independent life and driving was admitted to hosp with respiratory crisis with CHF, intubated and then sedated, extubated 4/30 and now referred to OT. PMHx: CHF, RA, CAD with stenting, HLD, COPD    PT Comments    Pt is moving along progressing with gait, but continues to have cognitive issues with slowing of thought processes and sequencing information for mobiltiy. He is scheduled to go to inpt treatment and still recommend CIR as he is from a very high PLOF and has mult therapy needs with cognitive issues as well.  Follow Up Recommendations  CIR     Equipment Recommendations  Rolling walker with 5" wheels    Recommendations for Other Services Rehab consult     Precautions / Restrictions Precautions Precautions: Fall (telemetry) Restrictions Weight Bearing Restrictions: No    Mobility  Bed Mobility Overal bed mobility: Needs Assistance Bed Mobility: Supine to Sit Rolling: Mod assist   Supine to sit: Mod assist     General bed mobility comments: Mainly help for LE's  Transfers Overall transfer level: Needs assistance Equipment used: 2 person hand held assist;Rolling walker (2 wheeled) Transfers: Sit to/from Stand;Lateral/Scoot Transfers Sit to Stand: Max assist;+2 physical assistance;+2 safety/equipment        Lateral/Scoot Transfers: Max assist General transfer comment: repetition of information with pt eventually completely following  Ambulation/Gait Ambulation/Gait assistance: +2 physical assistance;+2 safety/equipment;Mod assist;Min assist Ambulation Distance (Feet): 45 Feet Assistive device: Rolling walker (2 wheeled);2 person hand held assist (close guard chair) Gait Pattern/deviations: Step-through pattern;Wide base of support;Trunk flexed;Drifts right/left;Shuffle Gait velocity:  reduced Gait velocity interpretation: Below normal speed for age/gender General Gait Details: tends to let walker get farther away and then can be pulled in to safer posture with PT cuing continually   Stairs            Wheelchair Mobility    Modified Rankin (Stroke Patients Only)       Balance Overall balance assessment: Needs assistance Sitting-balance support: Feet supported;Bilateral upper extremity supported Sitting balance-Leahy Scale: Fair Sitting balance - Comments: lists to the left Postural control: Posterior lean Standing balance support: Bilateral upper extremity supported Standing balance-Leahy Scale: Poor Standing balance comment: reminders for controlling walker and cues for upright standing                    Cognition Arousal/Alertness: Awake/alert Behavior During Therapy: WFL for tasks assessed/performed;Flat affect Overall Cognitive Status: Impaired/Different from baseline Area of Impairment: Memory;Safety/judgement;Problem solving     Memory: Decreased recall of precautions;Decreased short-term memory   Safety/Judgement: Decreased awareness of safety;Decreased awareness of deficits   Problem Solving: Slow processing;Difficulty sequencing;Requires verbal cues;Requires tactile cues General Comments: continues to clear but needs close supervision of his safety    Exercises General Exercises - Lower Extremity Ankle Circles/Pumps: AROM;Both;5 reps Short Arc Quad: AROM;Both;10 reps Long Arc Quad: AROM;Both;10 reps Hip ABduction/ADduction: Both;Strengthening;10 reps Hip Flexion/Marching: AROM;Both;10 reps    General Comments General comments (skin integrity, edema, etc.): Greater control of sitting today and sits with better tolerance OOB      Pertinent Vitals/Pain Pain Assessment: No/denies pain    Home Living                      Prior Function            PT Goals (current goals  can now be found in the care plan section)  Acute Rehab PT Goals Patient Stated Goal: To walk Progress towards PT goals: Progressing toward goals    Frequency  Min 2X/week    PT Plan Current plan remains appropriate    Co-evaluation             End of Session Equipment Utilized During Treatment: Oxygen;Gait belt Activity Tolerance: Patient limited by fatigue Patient left: in chair;with call bell/phone within reach;with chair alarm set     Time: 1135-1200 PT Time Calculation (min) (ACUTE ONLY): 25 min  Charges:  $Gait Training: 8-22 mins $Therapeutic Exercise: 8-22 mins                    G Codes:      Ivar Drape 08-02-2014, 1:45 PM   Samul Dada, PT MS Acute Rehab Dept. Number: [phone]

## 2014-07-18 LAB — GLUCOSE, CAPILLARY
GLUCOSE-CAPILLARY: 104 mg/dL — AB (ref 70–99)
Glucose-Capillary: 164 mg/dL — ABNORMAL HIGH (ref 70–99)
Glucose-Capillary: 118 mg/dL — ABNORMAL HIGH (ref 70–99)

## 2014-07-18 MED ORDER — BACITRACIN-NEOMYCIN-POLYMYXIN OINTMENT TUBE
TOPICAL_OINTMENT | Freq: Three times a day (TID) | CUTANEOUS | Status: DC
  Administered 2014-07-18: 16:00:00 via TOPICAL
  Filled 2014-07-18: qty 15

## 2014-07-18 NOTE — Progress Notes (Signed)
Physical Therapy Treatment Patient Details Name: Andre Hayes MRN: [id number] DOB: 07-11-1940 Today's Date: 07/18/2014    History of Present Illness 74 yo male with independent life and driving was admitted to hosp with respiratory crisis with CHF, intubated and then sedated, extubated 4/30 and now referred to OT. PMHx: CHF, RA, CAD with stenting, HLD, COPD    PT Comments    Pt with improvement with ambulation and bed mobility/transfers today.  Able to maintain SpO2 abouve 89% during ambulation and improved to above 90% with standing rests and cues for diaphragmatic breathing.   Follow Up Recommendations  CIR     Equipment Recommendations  Rolling walker with 5" wheels    Recommendations for Other Services Rehab consult     Precautions / Restrictions Precautions Precautions: Fall Restrictions Weight Bearing Restrictions: No    Mobility  Bed Mobility Overal bed mobility: Needs Assistance Bed Mobility: Supine to Sit Rolling: Min guard   Supine to sit: Supervision     General bed mobility comments: improved sequencing with supine to sit decreased assistance needed  Transfers Overall transfer level: Needs assistance Equipment used: 2 person hand held assist;Rolling walker (2 wheeled) Transfers: Sit to/from Stand Sit to Stand: Mod assist        Lateral/Scoot Transfers: Min assist General transfer comment: improved transfer sequencing and tolerance  Ambulation/Gait Ambulation/Gait assistance: Min assist;Min guard Ambulation Distance (Feet): 50 Feet Assistive device: Rolling walker (2 wheeled) Gait Pattern/deviations: Decreased stride length;Narrow base of support;Trunk flexed;Shuffle Gait velocity: reduced   General Gait Details: VC's to stay within walker, narrow BOS which was corrects with cues   Stairs            Wheelchair Mobility    Modified Rankin (Stroke Patients Only)       Balance Overall balance assessment: Needs  assistance Sitting-balance support: Feet supported Sitting balance-Leahy Scale: Fair   Postural control: Posterior lean Standing balance support: Bilateral upper extremity supported Standing balance-Leahy Scale: Fair                      Cognition Arousal/Alertness: Awake/alert Behavior During Therapy: WFL for tasks assessed/performed Overall Cognitive Status: Impaired/Different from baseline Area of Impairment: Memory;Safety/judgement;Problem solving Orientation Level: Disoriented to;Place;Time   Memory: Decreased short-term memory;Decreased recall of precautions Following Commands: Follows one step commands inconsistently Safety/Judgement: Decreased awareness of safety;Decreased awareness of deficits Awareness: Intellectual Problem Solving: Slow processing;Difficulty sequencing;Requires verbal cues;Requires tactile cues General Comments: continues to require close supervision for safety    Exercises General Exercises - Lower Extremity Ankle Circles/Pumps: AROM;Both;15 reps;Seated Long Arc Quad: AROM;Both;15 reps;Seated Hip ABduction/ADduction: 15 reps;Both;AROM;Seated Hip Flexion/Marching: Seated;15 reps;AROM;Both    General Comments        Pertinent Vitals/Pain Pain Assessment: No/denies pain    Home Living                      Prior Function            PT Goals (current goals can now be found in the care plan section) Progress towards PT goals: Progressing toward goals    Frequency  Min 2X/week    PT Plan Current plan remains appropriate    Co-evaluation             End of Session Equipment Utilized During Treatment: Oxygen;Gait belt Activity Tolerance: Patient limited by fatigue Patient left: in chair;with call bell/phone within reach;with chair alarm set     Time: 0865-7846 PT Time Calculation (min) (ACUTE  ONLY): 31 min  Charges:  $Gait Training: 8-22 mins $Therapeutic Exercise: 8-22 mins                    G Codes:       Ellyssa Zagal 08/01/2014, 2:21 PM Keondria Siever, PTA

## 2014-07-18 NOTE — Progress Notes (Signed)
Nutrition Follow-up  DOCUMENTATION CODES:  INTERVENTION: Meals and Snacks: Cater to patient preferences Medical Nutrition Supplement: will monitor intake and need for additional nutritional supplementation on follow as intake is improving.  NUTRITION DIAGNOSIS:  Inadequate oral intake continues as evidenced by meal completion 50-75% of meals   GOAL: Energy intake:Patient will meet greater than or equal to 90% of their needs- goal not currently being met    MONITOR:  PO intake, Weight trends, Labs  REASON FOR ASSESSMENT:  Other (Comment) (RD Follow Up)    ASSESSMENT:  Typical Fluid/ Food Intake: "bites of food" recorded per I/O  Labs:  Electrolyte and Renal Profile:    Recent Labs Lab 07/13/14 0632 07/14/14 0425 07/15/14 0603  BUN 30* 28* 34*  CREATININE 0.83 0.84 0.73  NA 135 140 141  K 3.5 3.8 4.0  MG  --  2.3 2.2  PHOS  --  2.7 2.0*   Glucose Profile:  Recent Labs  07/17/14 2018 07/18/14 0720 07/18/14 1148  GLUCAP 168* 104* 164*       Meds: Folate, SSI, lasix, MVI, KCl, Colace  UOP: Reviewed Digestive System: Reviewed  Weight Changes: Reviewed  Height:  Ht Readings from Last 1 Encounters:  07/12/14 5' 11.97" (1.828 m)    Current Weight: 242#  Wt Readings from Last 1 Encounters:  07/15/14 243 lb 6.4 oz (110.406 kg)    Ideal Body Weight:    Wt Readings from Last 10 Encounters:  07/15/14 243 lb 6.4 oz (110.406 kg)  06/17/14 264 lb (119.75 kg)    BMI: Body mass index is 33.04 kg/(m^2).  Estimated Nutritional Needs:  Kcal: 6770-3403 kcal/ day (BEE: 1588 x 1.2 AF x 1.0-1.2 IF) using IBW  Protein: 81-97 g Pro/ day (1.0-1.2 g pro/ kg/ day) using IBW  Fluid: 2025-2427 ml/ day (25-30 ml/ kg) using IBW  Skin: Reviewed, no issues  Diet Order: DIET DYS 2 Room service appropriate?: Yes with Assist; Fluid consistency:: Nectar Thick  EDUCATION NEEDS:  No education needs identified at this time   Last BM:  Reviewed  Roda Shutters, RDN [phone]  MODERATE Care Level

## 2014-07-18 NOTE — Progress Notes (Signed)
Speech Language Pathology Treatment: Dysphagia  Patient Details Name: Andre Hayes MRN: [id number] DOB: November 16, 1940 Today's Date: 07/18/2014 Time: 3016-0109 SLP Time Calculation (min) (ACUTE ONLY): 42 min  Assessment / Plan / Recommendation Clinical Impression  Pt appeared to safely tolerate trials of thin liquids and mech soft foods w/ no overt s/s of aspiration noted; no apparent oral phase deficits noted. Pt is able to recall general aspiration precautions including small, single sips and using a f/u swallow when nec. to clear oropharynx completely b/t bites/sips. Pt appears at reduced risk for aspiration at this time.    HPI Other Pertinent Information: Pt up and walking w/ PT today. Pt verbally conversive and denied any trouble swallowing today during his meals.    Pertinent Vitals Pain Assessment: No/denies pain  SLP Plan  Continue with current plan of care    Recommendations Diet recommendations: Dysphagia 3 (mechanical soft);Thin liquid Liquids provided via: Cup;Straw Medication Administration: Whole meds with puree Supervision: Patient able to self feed;Intermittent supervision to cue for compensatory strategies Compensations: Slow rate;Small sips/bites Postural Changes and/or Swallow Maneuvers: Out of bed for meals;Seated upright 90 degrees              Oral Care Recommendations: Oral care BID Plan: Continue with current plan of care    GO     [name] 07/18/2014, 4:57 PM

## 2014-07-18 NOTE — Clinical Social Work Note (Signed)
CSW spoke to the pt's family.  They stated that they wanted pt to be placed at a facility in [facility].  CSW checked with pt's insurance, SNF facilities in [place] and all of [place].  No facility extended a bed offer.  CSW also checked with facilities given by insurance co.  Per [employer] they will not have availability for another week and they were not listed as a facility that was in network with pt's insurance.  CSW also checked with [facility].  Per the facility and [employer] rep at facility, pt insurance is out of network and family would need to pay privately in order to complete SNF rehab in that county.  CSW notified pt's family about this decision and they have agreed to DC pt to [facility].  Facility will be able to take pt in the morning as the cannot take admissions past a certain hour in the day.  CSW notified pt's family that pt would DC tomorrow via EMS to [facility] tomorrow.  CSW has sent clinicals to facility already.  CSW has staffed this with supervisor and will continue to follow to DC.

## 2014-07-18 NOTE — Progress Notes (Signed)
Pt alert, may have periods of confusion. VSS, on O2 with Cpap ordered QHS, No complaints of pain. Anticpating dc to facility this week.

## 2014-07-18 NOTE — Progress Notes (Signed)
SUBJECTIVE:  Review of Systems:  Subjective/Chief Complaint Presented with SOB, orthopnea and edema. Was on Bipap, worsened and intubated in CCU 4/27 2 AM. Extubated 07/13/14.  Had episode of SOB 07/14/14 evening- but after starting on Bipap overnight- he is fine now. Stable for last 2-3 days. C/o weakness and needing support to walk.   Review of sysrems     CONSTITUTIONAL: No documented fever. No fatigue, weakness. No weight gain, no weight loss.  EYES: No blurry or double vision.  ENT: No tinnitus. No postnasal drip. No redness of the oropharynx.  RESPIRATORY: No cough, mild wheeze, no hemoptysis. No dyspnea. uses 2 ltr oxygen. CARDIOVASCULAR: no chest pain. No orthopnea. No palpitations. No syncope.  GASTROINTESTINAL: No nausea, no vomiting or diarrhea. No abdominal pain. No melena or hematochezia.  GENITOURINARY: No dysuria or hematuria.  ENDOCRINE: No polyuria or nocturia. No heat or cold intolerance.  HEMATOLOGY: No anemia. No bruising. No bleeding.  INTEGUMENTARY: No rashes. No lesions.  MUSCULOSKELETAL: No arthritis. No swelling. No gout.  NEUROLOGIC: No numbness, tingling, or ataxia. No seizure-type activity.  PSYCHIATRIC: No anxiety. No insomnia.       Medications/Allergies Reviewed Medications/Allergies reviewed   VITAL SIGNS/ANCILLARY NOTES: **Vital Signs.: Filed Vitals:   07/18/14 1149  BP: 127/38  Pulse: 77  Temp:   Resp: 20        Physical Exam:  GEN well developed, disheveled.   HEENT pink conjunctivae, hearing intact to voice, dry oral mucosa,    NECK supple  No masses  thyroid not tender   RESP normal resp effort , mild wheeze, no use of accessory muscles, nasal canula on use.   CARD regular rate  murmur present  LE edema present  no JVD  + 1 edema b/l   ABD denies tenderness  soft  normal BS  no Abdominal Bruits  no Adominal Mass   GU foley catheter in place  clear yellow urine draining   EXTR negative cyanosis/clubbing, positive edema,  + 1 edema b/l   SKIN normal to palpation, No rashes, No ulcers   NEURO negative tremor, moves all limbs, follows comands. Power 4/5 in lower limbs, checked finger nose and heel to shin test fro co-ordination- both are satisfactory.   PSYCH Alert, oriented.   ASSESSMENT/PLAN:  Assessment/Plan:  Orders/Results reviewed Orders reviewed and updated, Laboratory results reviewed, Radiology results reviewed, Vital Signs reviewed   Assessment/Plan 74 yo male w/ hx of COPD, CHF, hx of CAD, hx of RA, BPH came into hospital due to shortness of breath, LE edema and noted to be in acute resp. failure.   * Acute Resp. Failure w/ hpoxia,hypercarbia - due to combination of CHF, COPD Exacerbation.  IV steroids, nebs, vent support initially. Extubated 07/13/14  Bipap at night, Appreicated help of Pulmonary.  Encouraged pt to use cpap at night.  will discharge with oxygen to rehab.  Advised to follow pulm clinic in 1 month.  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  Initially given IV lasix- responded well- stopped now.  * COPD Exacerbation - cont. IV steroids,  duonebs, empirical abx.  - on bipap at night. - will need oxygen to continue for now on d/c. - Also did swallow eval and barrium swallow to decide d/c diet.  * BPH - cont. Flomax.   * hx of RA - cont. Plaquenil, Folic Acid  * generalized weakness.   No focal weakness or co-ordiantion issues.   Likely due to overall medical condition.   Need rehab.    (  continued)  Son want him to go near to his house in [place].   Social worker spoke to him about options.   awaited insurance authorization.    DVT Prophylaxis Lovenox   Discharge Planning home   Code Status Full Code   Critical Care No, Total patient care time:, 35 min   Case Discussed With Nursing, CSW, Case manager.

## 2014-07-18 NOTE — Plan of Care (Signed)
Problem: Phase II Progression Outcomes Goal: O2 sats > equal to 90% on RA or at baseline Outcome: Not Met (add Reason) Pt remains on O2

## 2014-07-18 NOTE — Progress Notes (Signed)
ANTIBIOTIC CONSULT NOTE - INITIAL  Pharmacy Consult for Zosyn Indication: Acute respiratory failure/COPD exacerbation  Allergies  Allergen Reactions  . Erythromycin Base Rash    Patient Measurements: Height: 5' 11.97" (182.8 cm) Weight: 242 lb 4.8 oz (109.907 kg) IBW/kg (Calculated) : 77.53   Vital Signs: Temp: 98.3 F (36.8 C) (05/05 0357) Temp Source: Oral (05/05 0357) BP: 130/59 mmHg (05/05 0357) Pulse Rate: 71 (05/05 0357)  Labs: No results for input(s): WBC, HGB, PLT, LABCREA, CREATININE in the last 72 hours. Estimated Creatinine Clearance: 103.7 mL/min (by C-G formula based on Cr of 0.73).   Microbiology: No results found for this or any previous visit (from the past 720 hour(s)).  Medical History: Past Medical History  Diagnosis Date  . COPD (chronic obstructive pulmonary disease)   . Hyperlipidemia   . Pneumonia 05/02/14    not hospitalized  . Rheumatoid arthritis   . Pulmonary disease   . CAD (coronary artery disease)     Medications:  Scheduled:  . aspirin EC  81 mg Oral Daily  . budesonide (PULMICORT) nebulizer solution  0.5 mg Nebulization BID  . clopidogrel  75 mg Oral Daily  . docusate  50 mg Oral BID  . enoxaparin (LOVENOX) injection  40 mg Subcutaneous Q24H  . famotidine  20 mg Oral BID  . folic acid  1 mg Oral Daily  . furosemide  20 mg Oral Daily  . hydroxychloroquine  200 mg Oral BID  . ipratropium-albuterol  3 mL Nebulization QID  . methylPREDNISolone (SOLU-MEDROL) injection  40 mg Intravenous Q24H  . piperacillin-tazobactam (ZOSYN)  IV  3.375 g Intravenous Q8H  . potassium chloride  20 mEq Oral Daily  . QUEtiapine  25 mg Oral QHS  . sennosides  5 mL Oral BID  . tamsulosin  0.4 mg Oral QHS   Assessment: Patient extubated and moved to floor on 5/1.  Currently order Zosyn 3.375 gm IV q8h per EI protocol for treatment of COPD exacerbation . No BCx/sputum Cx ordered.   Goal of Therapy:  Eradication of infection Renal adjustments as  warranted  Plan:  Continue Zosyn 3.375 gm IV q8h per EI protocol based on patient's renal function.  Pharmacy will continue to follow.  Clarisa Schools, PharmD Clinical Pharmacist 07/18/2014

## 2014-07-19 LAB — CREATININE, SERUM
Creatinine, Ser: 0.79 mg/dL (ref 0.61–1.24)
GFR calc Af Amer: >60 mL/min (ref >60)
GFR calc non Af Amer: >60 mL/min (ref >60)

## 2014-07-19 NOTE — Progress Notes (Signed)
Pt is alert and is asking questions about his discharge to [facility]. Pt refuse all his night time medication. Pt denies pain. Pt pulled out his IV during shift. Another IV site was located. No other signs of distress noted. Will continue to monitor.

## 2014-07-19 NOTE — Progress Notes (Signed)
SUBJECTIVE:  Review of Systems:  Subjective/Chief Complaint Presented with SOB, orthopnea and edema. Was on Bipap, worsened and intubated in CCU 4/27 2 AM. Extubated 07/13/14.  Had episode of SOB 07/14/14 evening- but after starting on Bipap overnight- he is fine now. Stable for last 2-3 days. C/o weakness and needing support to walk.   Review of sysrems     CONSTITUTIONAL: No documented fever. No fatigue, weakness. No weight gain, no weight loss.  EYES: No blurry or double vision.  ENT: No tinnitus. No postnasal drip. No redness of the oropharynx.  RESPIRATORY: No cough, mild wheeze, no hemoptysis. No dyspnea. uses 2 ltr oxygen. CARDIOVASCULAR: no chest pain. No orthopnea. No palpitations. No syncope.  GASTROINTESTINAL: No nausea, no vomiting or diarrhea. No abdominal pain. No melena or hematochezia.  GENITOURINARY: No dysuria or hematuria.  ENDOCRINE: No polyuria or nocturia. No heat or cold intolerance.  HEMATOLOGY: No anemia. No bruising. No bleeding.  INTEGUMENTARY: No rashes. No lesions.  MUSCULOSKELETAL: No arthritis. No swelling. No gout.  NEUROLOGIC: No numbness, tingling, or ataxia. No seizure-type activity.  PSYCHIATRIC: No anxiety. No insomnia.       Medications/Allergies Reviewed Medications/Allergies reviewed   VITAL SIGNS/ANCILLARY NOTES: **Vital Signs.: Filed Vitals:   07/19/14 1144  BP: 99/38  Pulse: 76  Temp: 98.3 F (36.8 C)  Resp: 18        Physical Exam:  GEN well developed, disheveled.   HEENT pink conjunctivae, hearing intact to voice, dry oral mucosa,    NECK supple  No masses  thyroid not tender   RESP normal resp effort , mild wheeze, no use of accessory muscles, nasal canula on use.   CARD regular rate  murmur present  LE edema present  no JVD  + 1 edema b/l   ABD denies tenderness  soft  normal BS  no Abdominal Bruits  no Adominal Mass      EXTR negative cyanosis/clubbing, positive edema, + 1 edema b/l   SKIN normal to  palpation, No rashes, No ulcers   NEURO negative tremor, moves all limbs, follows comands. Power 4/5 in lower limbs, checked finger nose and heel to shin test fro co-ordination- both are satisfactory.   PSYCH Alert, oriented.   ASSESSMENT/PLAN:  Assessment/Plan:  Orders/Results reviewed Orders reviewed and updated, Laboratory results reviewed, Radiology results reviewed, Vital Signs reviewed   Assessment/Plan 74 yo male w/ hx of COPD, CHF, hx of CAD, hx of RA, BPH came into hospital due to shortness of breath, LE edema and noted to be in acute resp. failure.   * Acute Resp. Failure w/ hpoxia,hypercarbia - due to combination of CHF, COPD Exacerbation.  IV steroids, nebs, vent support initially. Extubated 07/13/14  Bipap at night, Appreicated help of Pulmonary.  Encouraged pt to use cpap at night.  will discharge with oxygen to rehab.  Advised to follow pulm clinic in 1 month.  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  Initially given IV lasix- responded well- stopped now.  * COPD Exacerbation - cont. IV steroids,  duonebs, empirical abx. Taper steroids oral on d/c. - on bipap at night. - will need oxygen to continue for now on d/c. - Also did swallow eval and barrium swallow to decide d/c diet.  * BPH - cont. Flomax.   * hx of RA - cont. Plaquenil, Folic Acid  * generalized weakness.   No focal weakness or co-ordianation issues.   Likely due to overall medical condition.   Need rehab.    (  continued)  Son want him to go near to his house in [place].   Social worker spoke to him about options.   discharge today.   DVT Prophylaxis Lovenox   Discharge Planning home   Code Status Full Code   Critical Care No, Total patient care time:, 35 min   Case Discussed With Nursing, CSW, Case manager.

## 2014-07-19 NOTE — Progress Notes (Signed)
ANTIBIOTIC CONSULT NOTE - INITIAL  Pharmacy Consult for Zosyn Indication: Acute respiratory failure/COPD exacerbation  Allergies  Allergen Reactions  . Erythromycin Base Rash    Patient Measurements: Height: 5' 11.97" (182.8 cm) Weight: 241 lb 9.6 oz (109.589 kg) IBW/kg (Calculated) : 77.53   Vital Signs: Temp: 98.2 F (36.8 C) (05/06 0513) Temp Source: Oral (05/06 0513) BP: 131/64 mmHg (05/06 0513) Pulse Rate: 74 (05/06 0513)  Labs:  Recent Labs  07/19/14 0446  CREATININE 0.79   Estimated Creatinine Clearance: 103.5 mL/min (by C-G formula based on Cr of 0.79).   Microbiology: No results found for this or any previous visit (from the past 720 hour(s)).  Medical History: Past Medical History  Diagnosis Date  . COPD (chronic obstructive pulmonary disease)   . Hyperlipidemia   . Pneumonia 05/02/14    not hospitalized  . Rheumatoid arthritis   . Pulmonary disease   . CAD (coronary artery disease)     Medications:  Scheduled:  . aspirin EC  81 mg Oral Daily  . budesonide (PULMICORT) nebulizer solution  0.5 mg Nebulization BID  . clopidogrel  75 mg Oral Daily  . docusate  50 mg Oral BID  . enoxaparin (LOVENOX) injection  40 mg Subcutaneous Q24H  . famotidine  20 mg Oral BID  . folic acid  1 mg Oral Daily  . furosemide  20 mg Oral Daily  . hydroxychloroquine  200 mg Oral BID  . ipratropium-albuterol  3 mL Nebulization QID  . methylPREDNISolone (SOLU-MEDROL) injection  40 mg Intravenous Q24H  . neomycin-bacitracin-polymyxin   Topical TID  . piperacillin-tazobactam (ZOSYN)  IV  3.375 g Intravenous Q8H  . potassium chloride  20 mEq Oral Daily  . QUEtiapine  25 mg Oral QHS  . sennosides  5 mL Oral BID  . tamsulosin  0.4 mg Oral QHS   Assessment: Patient extubated and moved to floor on 5/1.  Currently order Zosyn 3.375 gm IV q8h per EI protocol for treatment of COPD exacerbation . No BCx/sputum Cx ordered.   Goal of Therapy:  Eradication of infection Renal  adjustments as warranted  Plan:  Continue Zosyn 3.375 gm IV q8h per EI protocol based on patient's renal function.  Pharmacy will continue to follow.  Clarisa Schools, PharmD Clinical Pharmacist 07/19/2014

## 2014-07-19 NOTE — Progress Notes (Signed)
PT Cancellation Note  Patient Details Name: Andre Hayes MRN: [id number] DOB: 02-Jul-1940   Cancelled Treatment:    Reason Eval/Treat Not Completed: Other (comment) (Leaving later for rehab facility and declined PT).  States he is leaving shortly but is dressed in gown.   Ivar Drape 07/19/2014, 11:01 AM   Samul Dada, PT MS Acute Rehab Dept. Number: [phone]

## 2014-07-19 NOTE — Progress Notes (Addendum)
Pt discharge pending to [facility], report called to [place] at facility, EMS notified of transportation at 1054.

## 2014-07-23 ENCOUNTER — Encounter: Payer: Self-pay | Admitting: *Deleted

## 2014-07-23 DIAGNOSIS — J449 Chronic obstructive pulmonary disease, unspecified: Secondary | ICD-10-CM

## 2014-07-26 ENCOUNTER — Other Ambulatory Visit: Payer: Commercial Managed Care - HMO | Admitting: *Deleted

## 2014-07-26 NOTE — Patient Outreach (Addendum)
[facility]) Care Management  [facility] Social Work  07/26/2014  Andre Hayes [facility] [birth year] [id number]  Subjective:    Objective:   Current Medications:  Current Outpatient Prescriptions  Medication Sig Dispense Refill  . albuterol (PROVENTIL HFA;VENTOLIN HFA) 108 (90 BASE) MCG/ACT inhaler Inhale 1-2 puffs into the lungs 4 (four) times daily as needed for shortness of breath.     Marland Kitchen aspirin EC 81 MG tablet Take 81 mg by mouth daily.    . clopidogrel (PLAVIX) 75 MG tablet Take 75 mg by mouth daily.    Marland Kitchen docusate sodium (COLACE) 100 MG capsule Take 100 mg by mouth daily as needed for mild constipation.     . fluticasone (FLOVENT HFA) 44 MCG/ACT inhaler Inhale 1 puff into the lungs 2 (two) times daily. 1 Inhaler 12  . folic acid (FOLVITE) 1 MG tablet Take 1 mg by mouth daily.    . furosemide (LASIX) 20 MG tablet Take 20 mg by mouth daily.    . hydroxychloroquine (PLAQUENIL) 200 MG tablet Take 200 mg by mouth 2 (two) times daily.     Marland Kitchen levofloxacin (LEVAQUIN) 500 MG tablet Take 1 tablet (500 mg total) by mouth daily. 3 tablet 0  . predniSONE (STERAPRED UNI-PAK 21 TAB) 10 MG (21) TBPK tablet Take 1 tablet (10 mg total) by mouth daily. Start 6 tabs daily for first day- taper 10 mg daily until complete. 21 tablet 0  . QUEtiapine (SEROQUEL) 25 MG tablet Take 1 tablet (25 mg total) by mouth at bedtime. 20 tablet 0  . tamsulosin (FLOMAX) 0.4 MG CAPS capsule Take 0.4 mg by mouth 4 (four) times daily.      No current facility-administered medications for this visit.    Functional Status:  In your present state of health, do you have any difficulty performing the following activities: 07/14/2014 07/14/2014  Hearing? - N  Vision? - N  Difficulty concentrating or making decisions? - Y  Walking or climbing stairs? - -  Dressing or bathing? - Y  Doing errands, shopping? Y -  Quarry manager and eating ? - -  Using the Toilet? - -  In the past six months, have you accidently leaked  urine? - -  Do you have problems with loss of bowel control? - -  Managing your Medications? - -  Managing your Finances? - -  Housekeeping or managing your Housekeeping? - -    Fall/Depression Screening:  PHQ 2/9 Scores 06/17/2014  PHQ - 2 Score 0  Exception Documentation Other- indicate reason in comment box  Not completed pt states no feelings of depression    Assessment : Visit to patient while at the SNF to assess assistance needed with discharge.  Per patient, he is scheduled for discharge on 07/31/14.  Patient's son who lives in [place] will transport patient home.  Per patient he needs a hold bar in his tub to help decrease falls.  Patient concerned about falling at home and would like a falls assessment done post discharge.  Per patient he will discharge with home health, Physical Therapy  and Occupational Therapy. Patient discussed plans to return to the   gym close to his home for exercise.  He has signed up for silver sneakers which allows him access to [employer] in [place]. Spoke with discharge planner Phil Dopp who confirmed discharge for 07/31/14 with Home Health( [facility]).  DME Equipment -[employer] to be used.  Advanced Directive materials provided for patient.  Plan: This social  worker will follow up with patient following discharge from the Skilled Nursing Facility.     [facility] CM Care Plan Problem One        Patient Outreach from 06/17/2014 in [facility]   Care Plan Problem One  Activity intolerance related to COPD   Care Plan for Problem One  Active   [facility] Long Term Goal (31-90 days)  Within 90 days be able to go to the gym and exercise   [facility] Long Term Goal Start Date  05/17/14   Interventions for Problem One Long Term Goal  Discussed the importance of exercise.    [facility] CM Short Term Goal #1 (0-30 days)  Increase exercise to 2 times per week in the next 30 days   [facility] CM Short Term Goal #1 Start Date  06/17/14   Interventions for Short Term Goal #1   Discussed the importance of safe exercise.    [facility] CM Short Term Goal #2 (0-30 days)  Pt to increase use of cpap machine to every night in the next 30 days.   [facility] CM Short Term Goal #2 Start Date  06/17/14   Interventions for Short Term Goal #2  Educated pt on the importance of using prescribed cpap to improve lung function and ability to rest at night.    [facility] CM Care Plan Problem Two        Patient Outreach from 07/26/2014 in [facility]   Care Plan Problem Two  patient in skilled nursing faciilit   Care Plan for Problem Two  Active   [facility] CM Short Term Goal #1 (0-30 days)  patient to discharge home from SNFwiithin 30 days   [facility] CM Short Term Goal #1 Start Date  07/19/14   Interventions for Short Term Goal #2   social worker discussed discharge plan with discharge planner in the facility   [facility] CM Short Term Goal #2 (0-30 days)  patient to actively participate in physical and occupational therapy for the next 5 daysin preparation for discharge home   [facility] CM Short Term Goal #2 Start Date  07/19/14   Interventions for Short Term Goal #2  encouraged patient to follow care plan for Physical Therapy and Occupational Therapy while at the facility       [facility] Care Management [phone]

## 2014-08-05 ENCOUNTER — Other Ambulatory Visit: Payer: Self-pay | Admitting: *Deleted

## 2014-08-05 DIAGNOSIS — R0602 Shortness of breath: Secondary | ICD-10-CM | POA: Insufficient documentation

## 2014-08-05 NOTE — Patient Outreach (Signed)
Left HIPPA compliant message for pt to call RNCM back.   Costella Hatcher RN, [name]  [facility] Care Management [phone])

## 2014-08-06 ENCOUNTER — Other Ambulatory Visit: Payer: Self-pay | Admitting: *Deleted

## 2014-08-06 VITALS — BP 120/72 | HR 87 | Resp 24 | Wt 246.0 lb

## 2014-08-06 DIAGNOSIS — Z599 Problem related to housing and economic circumstances, unspecified: Secondary | ICD-10-CM

## 2014-08-06 DIAGNOSIS — Z598 Other problems related to housing and economic circumstances: Secondary | ICD-10-CM

## 2014-08-06 NOTE — Patient Outreach (Signed)
[facility]) Care Management  08/06/2014  Andre Hayes [facility] 03-14-1941 [id number]    Home visit re-scheduled at patient's request for 08/08/14 at 9:00am.   Adriana Reams [facility] Care Management [phone]

## 2014-08-07 NOTE — Patient Outreach (Signed)
[facility]) Care Management   08/06/14  Andre Hayes 07-31-1940 [id number]  Andre Hayes is an 74 y.o. male  Subjective: " I need you to look over all my medication to see if I am taking it right." " I want to get my strength up so I participate in pulmonary rehab." "I am not sure how much furosemide I should take because I know the bottle says twice a day but my doctor before I went to the hospital said I could cut back since I am voiding so much at night keeping me awake." "I have so many inhalers I can not afford the one that was prescribed for me yesterday."  Objective: Blood pressure 120/72, pulse 87, resp. rate 24, weight 246 lb (111.585 kg), SpO2 95 % on 2lpm of 02 via n/c. Review of Systems  Constitutional: Positive for malaise/fatigue.  HENT: Positive for congestion.   Respiratory: Positive for cough, sputum production, shortness of breath and wheezing.   Cardiovascular: Positive for leg swelling.  Genitourinary: Positive for frequency.  All other systems reviewed and are negative.   Physical Exam  Vitals reviewed. Constitutional: He is oriented to person, place, and time. He appears well-developed and well-nourished. He is active and cooperative.  Cardiovascular: Normal rate, regular rhythm and normal heart sounds.   Pulses:      Dorsalis pedis pulses are 2+ on the right side, and 2+ on the left side.  Respiratory: Tachypnea noted. He has rhonchi in the right lower field. He has rales in the right lower field.  Pt noted to be SOB with exertion. Pt also reported having a "rough night because he couldn't stop coughing and wheezing during the night." Using CPAP during the night and 02 at 2lpm during the day.    GI: Soft. Normal appearance and bowel sounds are normal.  Musculoskeletal: Normal range of motion.       Right lower leg: He exhibits edema.       Legs: Neurological: He is alert and oriented to person, place, and time.  Skin: Skin is warm and  dry.  Scattered healing bruises noted to bilateral upper arms related to pt's recent hospitalization.  Psychiatric: He has a normal mood and affect. His behavior is normal. Judgment and thought content normal. Cognition and memory are normal.  Pt reports feeling like his short term memory was impaired, but able to answer all questions appropriately.     Current Medications:   Current Outpatient Prescriptions  Medication Sig Dispense Refill  . albuterol (PROVENTIL HFA;VENTOLIN HFA) 108 (90 BASE) MCG/ACT inhaler Inhale 1-2 puffs into the lungs 4 (four) times daily as needed for shortness of breath.     . Albuterol Sulfate (PROAIR HFA IN) Inhale 2 puffs into the lungs 4 (four) times daily as needed.    Marland Kitchen aspirin EC 81 MG tablet Take 81 mg by mouth daily.    . clopidogrel (PLAVIX) 75 MG tablet Take 75 mg by mouth daily.    Marland Kitchen docusate sodium (COLACE) 100 MG capsule Take 100 mg by mouth daily as needed for mild constipation.     . finasteride (PROSCAR) 5 MG tablet Take 5 mg by mouth daily.    . folic acid (FOLVITE) 1 MG tablet Take 1 mg by mouth daily.    . furosemide (LASIX) 20 MG tablet Take 20 mg by mouth daily.     . hydroxychloroquine (PLAQUENIL) 200 MG tablet Take 200 mg by mouth 2 (two) times daily.     Marland Kitchen  research study medication Inhale 1 puff into the lungs daily.    . tamsulosin (FLOMAX) 0.4 MG CAPS capsule Take 0.4 mg by mouth 3 (three) times daily.     . fluticasone (FLOVENT HFA) 44 MCG/ACT inhaler Inhale 1 puff into the lungs 2 (two) times daily. (Patient not taking: Reported on 07/26/2014) 1 Inhaler 12  . levofloxacin (LEVAQUIN) 500 MG tablet Take 1 tablet (500 mg total) by mouth daily. (Patient not taking: Reported on 07/26/2014) 3 tablet 0  . predniSONE (STERAPRED UNI-PAK 21 TAB) 10 MG (21) TBPK tablet Take 1 tablet (10 mg total) by mouth daily. Start 6 tabs daily for first day- taper 10 mg daily until complete. (Patient not taking: Reported on 07/26/2014) 21 tablet 0  . QUEtiapine  (SEROQUEL) 25 MG tablet Take 1 tablet (25 mg total) by mouth at bedtime. (Patient not taking: Reported on 08/06/2014) 20 tablet 0   No current facility-administered medications for this visit.    Functional Status:   In your present state of health, do you have any difficulty performing the following activities: 07/26/2014 07/14/2014  Hearing? - -  Vision? - -  Difficulty concentrating or making decisions? - -  Walking or climbing stairs? - -  Dressing or bathing? - -  Doing errands, shopping? - Y  Preparing Food and eating ? N -  Using the Toilet? N -  In the past six months, have you accidently leaked urine? N -  Do you have problems with loss of bowel control? N -  Managing your Medications? N -  Managing your Finances? N -  Housekeeping or managing your Housekeeping? Y -    Fall/Depression Screening:    PHQ 2/9 Scores 07/26/2014 06/17/2014  PHQ - 2 Score 0 0  Exception Documentation - Other- indicate reason in comment box  Not completed - pt states no feelings of depression    Assessment:  COPD: SOB on mild exertion noted. Lungs with rales and ronchi to lower right lung field. Pt noted to be coughing up thick frothy green mucus. Reports increased cough andwheeze at night. Pt using 02 as directed and CPAP at night. Several inhalant medications, including clinical trial medication, nebulizer, inhalers and spiriva. One inhaler not picked up from pharmacy because pt reports not being able to afford medication. Pt unsure on how often and when he should be using rescue medications for COPD.  HF:Pt has some slight edema to lower extremities. Clarification needs to be received on furosemide dosing. Recent hospitalization noted. Pt received HF packet and is becoming familiar with HF zones. Recording weights and is familiar with the importance of letting MD know in there is a sudden weight gain. Pt has seen cardiologist this week. As above noted pt with rales and ronchi to right lower lung field  and productive cough noted.   General: Pt's sister, Starleen Blue, at his home for short term assistance with IADLS. Home health, OT, PT and respiratory supplies provided as ordered. Pt noted to be in good spirits and wiling to work with providers towards optimal health, requesting further medication review and assistance getting a referral to the pulmonary rehab department.     Plan: RNCM placed [facility] pharmacy consult to review and clarify medications with pt and physicians.  RNCM will contact Pulmonary Rehab at [facility] to find out the referral process and relay this to the pt.  RNCM will contact pt next week to verify he was able to schedule appt with pulmonologist.  [facility] will make a follow  up appt to see pt in his home in approx 1 month working.   Costella Hatcher RN, [name]  [facility] Care Management [phone])

## 2014-08-08 ENCOUNTER — Other Ambulatory Visit: Payer: Self-pay | Admitting: *Deleted

## 2014-08-08 NOTE — Patient Outreach (Signed)
[facility]) Care Management  [facility] Social Work  08/08/2014  Roney Youtz [facility] 1941/01/25 [id number]  Current Medications:  Current Outpatient Prescriptions  Medication Sig Dispense Refill  . albuterol (PROVENTIL HFA;VENTOLIN HFA) 108 (90 BASE) MCG/ACT inhaler Inhale 1-2 puffs into the lungs 4 (four) times daily as needed for shortness of breath.     . Albuterol Sulfate (PROAIR HFA IN) Inhale 2 puffs into the lungs 4 (four) times daily as needed.    Marland Kitchen aspirin EC 81 MG tablet Take 81 mg by mouth daily.    . clopidogrel (PLAVIX) 75 MG tablet Take 75 mg by mouth daily.    Marland Kitchen docusate sodium (COLACE) 100 MG capsule Take 100 mg by mouth daily as needed for mild constipation.     . finasteride (PROSCAR) 5 MG tablet Take 5 mg by mouth daily.    . fluticasone (FLOVENT HFA) 44 MCG/ACT inhaler Inhale 1 puff into the lungs 2 (two) times daily. (Patient not taking: Reported on 07/26/2014) 1 Inhaler 12  . folic acid (FOLVITE) 1 MG tablet Take 1 mg by mouth daily.    . furosemide (LASIX) 20 MG tablet Take 20 mg by mouth daily.     . hydroxychloroquine (PLAQUENIL) 200 MG tablet Take 200 mg by mouth 2 (two) times daily.     Marland Kitchen levofloxacin (LEVAQUIN) 500 MG tablet Take 1 tablet (500 mg total) by mouth daily. (Patient not taking: Reported on 07/26/2014) 3 tablet 0  . predniSONE (STERAPRED UNI-PAK 21 TAB) 10 MG (21) TBPK tablet Take 1 tablet (10 mg total) by mouth daily. Start 6 tabs daily for first day- taper 10 mg daily until complete. (Patient not taking: Reported on 07/26/2014) 21 tablet 0  . QUEtiapine (SEROQUEL) 25 MG tablet Take 1 tablet (25 mg total) by mouth at bedtime. (Patient not taking: Reported on 08/06/2014) 20 tablet 0  . research study medication Inhale 1 puff into the lungs daily.    . tamsulosin (FLOMAX) 0.4 MG CAPS capsule Take 0.4 mg by mouth 3 (three) times daily.      No current facility-administered medications for this visit.    Functional Status:  In your present state  of health, do you have any difficulty performing the following activities: 07/26/2014 07/14/2014  Hearing? - -  Vision? - -  Difficulty concentrating or making decisions? - -  Walking or climbing stairs? - -  Dressing or bathing? - -  Doing errands, shopping? - Y  Preparing Food and eating ? N -  Using the Toilet? N -  In the past six months, have you accidently leaked urine? N -  Do you have problems with loss of bowel control? N -  Managing your Medications? N -  Managing your Finances? N -  Housekeeping or managing your Housekeeping? Y -    Fall/Depression Screening:  PHQ 2/9 Scores 07/26/2014 06/17/2014  PHQ - 2 Score 0 0  Exception Documentation - Other- indicate reason in comment box  Not completed - pt states no feelings of depression    Assessment:  Discharge home visit.  Patient's sister visiting when this social worker arrived.  Has been staying with patient since Sunday.  Per patient, she is leaving today.  Patient reports no medication changes, continues to describe positive support.  Has supportive son and family.  Landlord lives in the back house and checks on him regularly as well.  Has housekeeper clean home approximately once a month. Per patient he cooks his own  meals, takes  care of his own ADL's.  Recently purchased grab bar for his bathroom, states no need for shower chair, landlord will have it installed to assist patient with getting in and out of shower.  [employer] contacted during visit to answer questions regarding refilling patient's oxygen tanks, standard and portable- re-ordered albuterol during the call as well.  Per patient, he drives himself to his own doctor's appointments.  Verbalized having no social work needs at this time.  Patient currently receiving Home health, OT, PT.   Plan:  Patient has no further social work needs. Case to be closed to social work

## 2014-08-09 ENCOUNTER — Other Ambulatory Visit: Payer: Self-pay | Admitting: Pharmacist

## 2014-08-09 NOTE — Patient Outreach (Signed)
Andre Hayes was referred to pharmacy for medication management, particularly to address potential discrepancies between his medication list and how he is taking his medications. Left a HIPAA compliant message on the patient's voicemail. If have not heard from patient by 08/14/14, will give him another call at that time.  Duanne Moron, PharmD Clinical Pharmacist [facility] Care Management [phone]

## 2014-08-09 NOTE — Patient Outreach (Signed)
Received a call back from Mr. Bram. Will see patient in his home to review his medications on Friday, 08/16/14 at 10 AM.   Duanne Moron, PharmD Clinical Pharmacist [facility] Care Management [phone]

## 2014-08-13 ENCOUNTER — Other Ambulatory Visit: Payer: Self-pay | Admitting: *Deleted

## 2014-08-13 NOTE — Patient Outreach (Signed)
RNCM made call to pt to f/u on previously discussed plan. Pt stated he was weighing daily and weight was stable.Pt stated he was taking his lasix as directed. He stated he was continuing to be SOB unless he was on his oxygen at 3lpm via n/c. Pt stated he was able to participate in his physical therapy and occupational therapy as ordered. Pt stated he was able to get an appointment with the pulmonologist for next week. RNCM and pt discussed questions to ask the pulmonologist when he goes, such as should he have a repeat sleep study to adjust his C-PAP mask or settings, and  which medications was he still supposed to be taking. Pt had a knock at the door and had to go before RNCM was able to talk with him about the process in which to have Pulmonary rehab ordered and to make a follow up appointment for [facility] home visit.   Plan: Pharmacist plans to see pt on Friday (6/3), will write plainly the instructions for pulmonary rehab for her to give pt to take to MD next week.   Will call pt next week to go over questions and make follow-up home visit appointment.   Costella Hatcher RN, [name]  [facility] Care Management [phone])

## 2014-08-15 ENCOUNTER — Ambulatory Visit: Payer: Commercial Managed Care - HMO | Admitting: Family

## 2014-08-16 ENCOUNTER — Other Ambulatory Visit: Payer: Self-pay | Admitting: Pharmacist

## 2014-08-16 ENCOUNTER — Encounter: Payer: Self-pay | Admitting: Pharmacist

## 2014-08-16 NOTE — Patient Outreach (Signed)
Called to follow up with Andre Hayes this afternoon to let him know that his Flovent is a tier 3 medication through his insurance and to suggest that he contact [facility] to determine cost savings of getting this inhaler through their pharmacy. Let him know that I contacted [facility] and the dose most recently prescribed by his PCP, Andre Hayes, was the Andre Hayes HFA 110 mcg/act. Patient stated that he would call [facility].  Patient also reported that he spoke with the nurse for the clinical trial and she instructed that he should be taking 2 puffs, rather than 1, once daily. Andre Hayes reports that he will start using it this way moving forward. Also reinforced the importance of holding his breath after each inhalation of this and his ProAir inhaler.  He also reported that he has left a message with his Rheumatologist's office to discuss his hydroxychloroquine and folic acid.  Reports that the nurse for the C-PAP machine came this afternoon and both gave him a new mask and showed him how to turn up the temperature on the water in the machine. He is hopeful that this will make it easier to use.  As patient has his Cardiology appointment next Monday, will next follow up with him on 08/21/14 by phone call.  Andre Hayes, PharmD Clinical Pharmacist [facility] Care Management [phone]

## 2014-08-16 NOTE — Patient Outreach (Signed)
[facility]) Care Management  [place]   08/16/2014  Markes Shatswell [facility] 12-31-1940 [id number]  Subjective: LAMONDRE WESCHE was referred to pharmacy for medication management, particularly to address potential discrepancies between his medication list and how he is taking his medications. Met with Mr. Frimpong today in his home.  Mr. Bordas discussed his recent hospitalization and skilled nursing facility stay. Patient reports that this past hospitalization was the first time that he had ever been told that he has heart failure and that this diagnosis was also written on his hospital discharge summary. However, patient reports that he was told by his pulmonologist, Dr. Stevenson Clinch that his lack of oxygen had put strain on his heart to cause the heart failure symptoms. Patient reports that he is scheduled for a Nuclear stress test and Echocardiogram on  08/19/14 with his cardiologist and will ask about this diagnosis at that time. Patient also explained that his breathing difficulty started a couple of years ago. Reports that after he had a stent placed, it was resolved. However, reports that this stent failed and that when a new stent was placed, his breathing was not as good as it had been before. Reports concern that some of the other blood vessels of his heart that were less occluded at the time of that stent, could be more occluded now. Reports that he has asked his cardiologist for this testing to evaluate the vessels.  Reviewed medications, indications and dosing with the patient. He reports that he rarely misses a dose, maybe once a year. Says that he is "like a robot", his morning routine is always the same - taking his weight, starting breakfast, then using his inhaler, nebulizer and taking his oral medications. Patient reports that he is not taking finasteride, as it made him dizzy. 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 However, with a second trial, found it to have the same effect. Reports that he is going to call his Urologist to let her know. Patient reports that he is also not taking quetiapine, as he does not need it. Reports that he is no longer taking levofloxacin or prednisone as he has completed the course of each.   Reports that he is not taking the Flovent inhaler because it was too expensive for him. Discussed how Flovent works compared to his other inhalers. Patient has been taking both a ProAir and Ventolin inhaler as needed for shortness of breath. Counseled patient that these inhalers are the same and he does not need to take both. Patient's Ventolin inhaler is expired. Instructed patient to get rid of this inhaler and just use his ProAir for rescue. Reviewed administration technique with patient. Patient reports that he has not been holding his breath following inhalation with the ProAir. Reviewed and patient verbalized understanding. Patient is also using albuterol nebulizer solution every day four times daily. Patient reports that he is using his study medication, 1 puff once daily. Reviewed study documents with Mr. Boody. This study inhaler contains either tiotropium + olodaterol or tiotropium + placebo, depending on which treatment arm he was assigned to. According to this paperwork, Mr. Enyeart is to be taking 2 puffs of the study medication daily, rather than 1 puff. Patient reports that he had been told 1 puff. Reports that he will follow up with the study nurse.   Patient  reports that he takes folic acid and hydroxychloroquine for rheumatoid arthritis. Reports that he previously took methotrexate as well, but was instructed to stop. Reports that he takes hydroxychloroquine once daily. Discussed the possible occular side effects of hydroxychloroquine. Patient reports that he has eye exams annually. Reports that he has had no  rheumatoid arthritis symptoms. States that he has been thinking about asking his rheumatologist if he still needs to take both of these medications. Reports that he is hoping to reduce the number of medications that he takes.  Spoke with patient about his furosemide dosing. Reports that he has been taking 1 tablet once daily. Reports that his swelling in his legs is good during the day, but worse at night. Reports that he was instructed by his cardiologist to get some compression socks, which he is wearing today. The pair of compression socks that he is wearing today come up to just above his ankle. Suggested that he further discuss with his cardiologist what type of compression socks (pressure and length) he would recommend at his upcoming appointment.  Provided patient with pulmonary rehab instructions provided by Nurse Care Manager Janci Minor. Patient reports that he is currently non-adherent to use of his C-PAP. Reports that he is currently having trouble with it not fitting well and making his nose very cold. Reports that a nurse is coming this afternoon to help with his mask and machine.  Objective:   Current Medications: Current Outpatient Prescriptions  Medication Sig Dispense Refill  . albuterol (PROVENTIL HFA;VENTOLIN HFA) 108 (90 BASE) MCG/ACT inhaler Inhale 1-2 puffs into the lungs 4 (four) times daily as needed for shortness of breath.     Marland Kitchen albuterol (PROVENTIL) (2.5 MG/3ML) 0.083% nebulizer solution Take 2.5 mg by nebulization 4 (four) times daily.    . Albuterol Sulfate (PROAIR HFA IN) Inhale 2 puffs into the lungs 4 (four) times daily as needed.    Marland Kitchen aspirin EC 81 MG tablet Take 81 mg by mouth daily.    . clopidogrel (PLAVIX) 75 MG tablet Take 75 mg by mouth daily.    Marland Kitchen docusate sodium (COLACE) 100 MG capsule Take 100 mg by mouth daily as needed for mild constipation.     . folic acid (FOLVITE) 1 MG tablet Take 1 mg by mouth daily.    . furosemide (LASIX) 20 MG tablet Take 20 mg  by mouth daily.     . hydroxychloroquine (PLAQUENIL) 200 MG tablet Take 200 mg by mouth 2 (two) times daily.     . research study medication Inhale 1 puff into the lungs daily.    . simvastatin (ZOCOR) 40 MG tablet Take 40 mg by mouth daily.    . tamsulosin (FLOMAX) 0.4 MG CAPS capsule Take 0.4 mg by mouth 3 (three) times daily.     . finasteride (PROSCAR) 5 MG tablet Take 5 mg by mouth daily.    . fluticasone (FLOVENT HFA) 44 MCG/ACT inhaler Inhale 1 puff into the lungs 2 (two) times daily. (Patient not taking: Reported on 07/26/2014) 1 Inhaler 12  . levofloxacin (LEVAQUIN) 500 MG tablet Take 1 tablet (500 mg total) by mouth daily. (Patient not taking: Reported on 07/26/2014) 3 tablet 0  . predniSONE (STERAPRED UNI-PAK 21 TAB) 10 MG (21) TBPK tablet Take 1 tablet (10 mg total) by mouth daily. Start 6 tabs daily for first day- taper 10 mg daily until complete. (Patient not taking: Reported on 07/26/2014) 21 tablet 0  . QUEtiapine (SEROQUEL) 25 MG tablet Take 1 tablet (  25 mg total) by mouth at bedtime. (Patient not taking: Reported on 08/06/2014) 20 tablet 0   No current facility-administered medications for this visit.    Functional Status: In your present state of health, do you have any difficulty performing the following activities: 07/26/2014 07/14/2014  Hearing? - -  Vision? - -  Difficulty concentrating or making decisions? - -  Walking or climbing stairs? - -  Dressing or bathing? - -  Doing errands, shopping? - Y  Preparing Food and eating ? N -  Using the Toilet? N -  In the past six months, have you accidently leaked urine? N -  Do you have problems with loss of bowel control? N -  Managing your Medications? N -  Managing your Finances? N -  Housekeeping or managing your Housekeeping? Y -    Fall/Depression Screening: PHQ 2/9 Scores 07/26/2014 06/17/2014  PHQ - 2 Score 0 0  Exception Documentation - Other- indicate reason in comment box  Not completed - pt states no feelings of  depression    Assessment:  Mr. Shi is not currently administering his metered dose inhalers correctly, not holding his breath following inhalation. Patient is currently not taking his Flovent inhaler due to cost concerns. It is a tier 3 medication through his [employer].  Patient needs follow up with his Urologist to report that he has stopped the finasteride and discuss if an alternative is needed.  Patient needs follow up with pulmonary study nurse to determine how he should be taking the study inhaler.  He is currently non-adherent to use of his C-PAP   Plan:  Patient to remember to hold his breath after each inhalation of the ProAir and study inhaler.  Patient to follow up with his Urologist about having stopped his finasteride, with his rheumatologist about his current regimen, and with the pulmonary study nurse about the directions for his study inhaler.  Patient to follow up with the nurse for his C-PAP machine.  Will follow up with Mr. Diana this afternoon to let him know that the Flovent is a tier 3 medication through his insurace and to suggest that he contact [place] to determine cost savings of getting his Flovent inhaler through their pharmacy.  Harlow Asa, PharmD Clinical Pharmacist [facility] [phone]

## 2014-08-16 NOTE — Patient Outreach (Signed)
Called Andre Hayes's local pharmacy to confirm information about patient's prescription for Flovent that he reports not picking up due to cost. Pharmacist reports that he was prescribed Flovent 110 mcg by his PCP, Dr. Dareen Piano and that the cost was $47/month through his [facility].  Will call to let the patient know the strength and prescriber so that he can provide this information to [facility] to discuss a transfer to their pharmacy for cost savings.  Duanne Moron, PharmD Clinical Pharmacist [facility] Care Management [phone]

## 2014-08-22 ENCOUNTER — Encounter: Payer: Self-pay | Admitting: Internal Medicine

## 2014-08-22 ENCOUNTER — Ambulatory Visit (INDEPENDENT_AMBULATORY_CARE_PROVIDER_SITE_OTHER): Payer: Commercial Managed Care - HMO | Admitting: Internal Medicine

## 2014-08-22 ENCOUNTER — Institutional Professional Consult (permissible substitution): Payer: Commercial Managed Care - HMO | Admitting: Internal Medicine

## 2014-08-22 VITALS — BP 92/60 | HR 85 | Temp 98.0°F | Ht 72.0 in | Wt 245.0 lb

## 2014-08-22 DIAGNOSIS — J441 Chronic obstructive pulmonary disease with (acute) exacerbation: Secondary | ICD-10-CM

## 2014-08-22 NOTE — Patient Instructions (Signed)
Follow up with Dr. Dema Severin in 1 month: -Pulmonary function testing and 6 minute walk test prior to follow-up visit. -Albuterol nebulizer, one ampule in the morning and one ampule in the evening. May use during the day as needed for shortness of breath/wheezing/coughing -Continue with Spiriva -Please bring your Spiriva research papers at next visit -After pulmonary function test and 6 minute walk test we will determine if a referral is needed for pulmonary rehabilitation -after your next visit we will determine on overnight pulse oximetry testing -Continue wearing your CPAP nightly -Continue with physical therapy and exercise as tolerated.
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
## 2014-08-23 ENCOUNTER — Other Ambulatory Visit: Payer: Self-pay | Admitting: Pharmacist

## 2014-08-23 ENCOUNTER — Telehealth: Payer: Self-pay | Admitting: Obstetrics and Gynecology

## 2014-08-23 ENCOUNTER — Telehealth: Payer: Self-pay | Admitting: Internal Medicine

## 2014-08-23 NOTE — Telephone Encounter (Signed)
Andre Hayes called saying he's not taken the Finasteride that was prescribed to him in a long time. He said the medication makes him feel nauseous, dizzy, and causes headaches. He's wondering if there's anything else he can take instead. He also mentioned having been in the hospital at [facility] for three weeks with congestive heart failure problems. He'd like a phone call back to discuss the medication. Pt ph# [phone] Thank you.

## 2014-08-23 NOTE — Telephone Encounter (Signed)
Called patient and advised to continue Flovent.  It was marked not taking in error. Left detailed vmail on home number.

## 2014-08-23 NOTE — Telephone Encounter (Signed)
Pt is asking if he needs to continue the Flovent. On the medications under your note you started it states pt not taking. He is asking if he should taking. He also said to mention that now he is having a prod cough throughout the day now and the cough is keeping him up at night. Please advise.

## 2014-08-23 NOTE — Patient Outreach (Addendum)
Called to follow up with [name]. [name] reports that he is doing well today. While on the phone with the patient, noted that he had a productive cough.  Reports that he met with his new Pulmonologist, Dr. Stevenson Clinch, yesterday. Reports that Dr. Stevenson Clinch decreased his scheduled albuterol nebulizer use to twice daily, with him to use the nebulizer one additional time/day as needed. Reports that he did mention his cough to Dr. Stevenson Clinch, but that it is worse today. Reports that he has had this productive cough for about a month now, but that it had only been present at night when lying down. However, today is the first time that it has happened during the day. Reports that he did not speak with Dr. Stevenson Clinch about his Flovent inhaler and has not yet called Mail Order to find out the cost of receiving it through the mail.  Reports that his Cardiology tests on Monday went well. Reports that he was told that he does have some dysfunction with one of his heart valves. He was started on a new medication, isosorbide mononitrate ER 30 mg every morning (noted in EPIC). Reports that he has had side effects, the beginning of a headache and stomach upset, since starting this medication. Counseled patient that these are normal side effects of the medication. He notified his Cardiologist of the side effects and was told that they should improve over time. Note: per Dr. Alveria Apley 08/19/14 note in [facility], patient assessed as having "Chronic diastolic CHF (congestive heart failure), NYHA class 3".  Reports that he spoke with his Rheumatologist and was told that he could stop taking folic acid and decrease his Plaquenil dose to 1 tablet every other day. Discussed with patient his strategy with remembering this every other day dosing when he fills his pillbox. Reports that he was told to try this dosing for 1 month and then follow up to see if any new symptoms had occurred. If not, he and his provider could discuss a further  reduction/stopping this medication.  Reports that he has not had a chance to follow up with his Urologist yet. Reports that his new CPAP mask is working well for him. Confirmed that patient has been using proper inhaler technique with both his albuterol and study medication inhalers.    Plan:  [name] to follow up with Dr. Merian Capron office to let Dr. Stevenson Clinch know of his productive cough that is now also occurring during the day. Also, to let Dr. Stevenson Clinch know that he has not been taking the Flovent inhaler due to cost concerns and to ask if this is something Dr. Stevenson Clinch wanted him to be taking.  [name] also to follow up with his Urologist to let her know that he has not been taking the finasteride due to dizziness side effect.  Let [name] know that I will follow up with him again on the afternoon of 08/28/14.  Harlow Asa, PharmD Clinical Pharmacist [facility] [phone]

## 2014-08-23 NOTE — Telephone Encounter (Signed)
LMOM for patient to return call.

## 2014-08-23 NOTE — Telephone Encounter (Signed)
Dr. Dema Severin disregard the message about the Flobent. Please advise about the cough

## 2014-08-26 NOTE — Telephone Encounter (Signed)
[phone] returning call/cb

## 2014-08-26 NOTE — Telephone Encounter (Signed)
Left detailed message on machine for pt to return call if need to be addressed today, otherwise Dr Dema Severin will be in office tomorrow.

## 2014-08-26 NOTE — Telephone Encounter (Signed)
Spoke with pt who c/o finasteride causing dizziness and nausea. Nurse noted pt PMH. Nurse and pt agreed pt will try medication for at least 88mo and keep track of days nausea occurs and dizziness occurs. At the end of the 62mo pt will call office back with observations. Cw,lpn

## 2014-08-26 NOTE — Telephone Encounter (Signed)
Nothing further needed 

## 2014-08-26 NOTE — Telephone Encounter (Signed)
lmomtcb x1 for pt to make aware Dr. Dema Severin is not in the office until tomorrow

## 2014-08-26 NOTE — Telephone Encounter (Signed)
Pt called back. He states his cough is better now and he no longer needs a call back. He will call us if anything else is needed.

## 2014-08-28 NOTE — Assessment & Plan Note (Signed)
Now resolving. Most likely due to diastolic heart failure exacerbation.   Plan:  -Pulmonary function testing and 6 minute walk test prior to follow-up visit. -Albuterol nebulizer, one ampule in the morning and one ampule in the evening. May use during the day as needed for shortness of breath/wheezing/coughing -Continue with Spiriva -Please bring your Spiriva research papers at next visit -After pulmonary function test and 6 minute walk test we will determine if a referral is needed for pulmonary rehabilitation -after your next visit we will determine on overnight pulse oximetry testing -Continue wearing your CPAP nightly -Continue with physical therapy and exercise as tolerated.

## 2014-09-02 ENCOUNTER — Other Ambulatory Visit: Payer: Self-pay | Admitting: Pharmacist

## 2014-09-02 NOTE — Patient Outreach (Signed)
Called to follow up with [name] about his call to Dr. Dema Severin about his productive cough and Flovent inhaler and about his call to his Urologist to let her know that he has not been taking the finasteride due to dizziness side effect.  Left [name] a message. If I have not heard back from him by 09/04/14, will follow up with him again at that time.  Duanne Moron, PharmD Clinical Pharmacist [facility] [phone]

## 2014-09-04 ENCOUNTER — Other Ambulatory Visit: Payer: Self-pay | Admitting: Pharmacist

## 2014-09-04 NOTE — Patient Outreach (Signed)
Called again to follow up with Mr. Prindle about his call to Dr. Dema Severin about his productive cough and Flovent inhaler and about his call to his Urologist to let her know that he has not been taking the finasteride due to dizziness side effect.  Left Mr. Namba a message. If I have not heard back from him by 09/09/14, will follow up with him again at that time.  Duanne Moron, PharmD Clinical Pharmacist [facility] [phone]

## 2014-09-09 ENCOUNTER — Ambulatory Visit: Payer: Commercial Managed Care - HMO | Attending: Family | Admitting: Family

## 2014-09-09 ENCOUNTER — Other Ambulatory Visit: Payer: Self-pay | Admitting: Pharmacist

## 2014-09-09 ENCOUNTER — Encounter: Payer: Self-pay | Admitting: Family

## 2014-09-09 ENCOUNTER — Telehealth: Payer: Self-pay | Admitting: Urology

## 2014-09-09 VITALS — BP 100/60 | HR 88 | Resp 20 | Ht 72.0 in | Wt 245.0 lb

## 2014-09-09 DIAGNOSIS — I509 Heart failure, unspecified: Secondary | ICD-10-CM | POA: Diagnosis not present

## 2014-09-09 DIAGNOSIS — N400 Enlarged prostate without lower urinary tract symptoms: Secondary | ICD-10-CM

## 2014-09-09 DIAGNOSIS — I5032 Chronic diastolic (congestive) heart failure: Secondary | ICD-10-CM

## 2014-09-09 DIAGNOSIS — J410 Simple chronic bronchitis: Secondary | ICD-10-CM

## 2014-09-09 DIAGNOSIS — Z87891 Personal history of nicotine dependence: Secondary | ICD-10-CM | POA: Insufficient documentation

## 2014-09-09 DIAGNOSIS — Z79899 Other long term (current) drug therapy: Secondary | ICD-10-CM | POA: Insufficient documentation

## 2014-09-09 DIAGNOSIS — Z9989 Dependence on other enabling machines and devices: Secondary | ICD-10-CM

## 2014-09-09 DIAGNOSIS — G4733 Obstructive sleep apnea (adult) (pediatric): Secondary | ICD-10-CM

## 2014-09-09 DIAGNOSIS — Z7982 Long term (current) use of aspirin: Secondary | ICD-10-CM | POA: Diagnosis not present

## 2014-09-09 MED ORDER — FINASTERIDE 5 MG PO TABS: 5.0000 mg | ORAL_TABLET | Freq: Every day | ORAL | Status: DC

## 2014-09-09 NOTE — Progress Notes (Signed)
Subjective:    Patient ID: Andre Hayes, male    DOB: 10/04/1940, 74 y.o.   MRN: [id number]  Congestive Heart Failure Presents for initial visit. The disease course has been stable. Associated symptoms include abdominal pain (twice in the last 2 weeks), edema, fatigue (mild) and shortness of breath. Pertinent negatives include no chest pain, chest pressure, muscle weakness, orthopnea, palpitations or paroxysmal nocturnal dyspnea. The symptoms have been stable. Past treatments include salt and fluid restriction and vasodilators. The treatment provided mild relief. Compliance with prior treatments has been good. His past medical history is significant for CAD and chronic lung disease. There is no history of HTN.  Shortness of Breath This is a recurrent problem. The current episode started more than 1 month ago. The problem occurs daily. The problem has been gradually improving. Associated symptoms include abdominal pain (twice in the last 2 weeks), leg swelling, vomiting and wheezing (at times). Pertinent negatives include no chest pain, fever, headaches, neck pain, orthopnea or PND. The symptoms are aggravated by exercise. The patient has no known risk factors for DVT/PE. He has tried beta agonist inhalers, ipratropium inhalers, oral steroids and steroid inhalers for the symptoms. The treatment provided moderate relief. His past medical history is significant for CAD, chronic lung disease, COPD and a heart failure.  Abdominal Pain This is a new problem. The current episode started 1 to 4 weeks ago. The onset quality is sudden. The problem occurs intermittently. The problem has been unchanged. The pain is located in the epigastric region. The pain is at a severity of 6/10. The pain is moderate. The quality of the pain is sharp. The abdominal pain does not radiate. Associated symptoms include frequency (wake up every 2-3 hours due to prostate enlargement) and vomiting. Pertinent negatives include no  constipation, dysuria, fever, headaches, myalgias or nausea. Nothing aggravates the pain. He has tried antacids for the symptoms. The treatment provided significant relief. His past medical history is significant for PUD (duodenal ulcer in the past).  Other This is a chronic (edema) problem. The current episode started more than 1 year ago. The problem occurs daily. The problem has been unchanged. Associated symptoms include abdominal pain (twice in the last 2 weeks), fatigue (mild) and vomiting. Pertinent negatives include no chest pain, coughing, fever, headaches, myalgias, nausea, neck pain, numbness or weakness. The symptoms are aggravated by standing. He has tried lying down for the symptoms. The treatment provided mild relief.   Allergies  Allergen Reactions  . Erythromycin Rash  . Erythromycin Base Rash   Prior to Admission medications   Medication Sig Start Date End Date Taking? Authorizing Provider  albuterol (PROVENTIL HFA;VENTOLIN HFA) 108 (90 BASE) MCG/ACT inhaler Inhale 1-2 puffs into the lungs 4 (four) times daily as needed for shortness of breath.    Yes Historical Provider, MD  albuterol (PROVENTIL) (2.5 MG/3ML) 0.083% nebulizer solution Take 2.5 mg by nebulization 2 (two) times daily.   Yes Historical Provider, MD  Albuterol Sulfate (PROAIR HFA IN) Inhale 2 puffs into the lungs 4 (four) times daily as needed.   Yes Historical Provider, MD  aspirin EC 81 MG tablet Take 81 mg by mouth daily.   Yes Historical Provider, MD  clopidogrel (PLAVIX) 75 MG tablet Take 75 mg by mouth daily.   Yes Historical Provider, MD  docusate sodium (COLACE) 100 MG capsule Take 100 mg by mouth daily.   Yes Historical Provider, MD  fluticasone (FLOVENT HFA) 44 MCG/ACT inhaler Inhale 1 puff into the  lungs 2 (two) times daily. 07/16/14  Yes Altamese Dilling, MD  furosemide (LASIX) 20 MG tablet Take 40 mg by mouth daily.   Yes Historical Provider, MD  hydroxychloroquine (PLAQUENIL) 200 MG tablet Take 200  mg by mouth every other day.   Yes Historical Provider, MD  ipratropium-albuterol (DUONEB) 0.5-2.5 (3) MG/3ML SOLN Inhale 3 mLs into the lungs every 4 (four) hours as needed.   Yes Historical Provider, MD  isosorbide mononitrate (IMDUR) 30 MG 24 hr tablet Take 30 mg by mouth daily.   Yes Historical Provider, MD  research study medication Inhale 1 puff into the lungs 2 (two) times daily.   Yes Historical Provider, MD  simvastatin (ZOCOR) 40 MG tablet Take 40 mg by mouth daily.   Yes Historical Provider, MD  tamsulosin (FLOMAX) 0.4 MG CAPS capsule Take 0.8 mg by mouth daily.   Yes Historical Provider, MD  tiotropium (SPIRIVA) 18 MCG inhalation capsule Place 18 mcg into inhaler and inhale daily. 10/08/13  Yes Historical Provider, MD  finasteride (PROSCAR) 5 MG tablet Take 1 tablet (5 mg total) by mouth daily. 09/09/14   Harle Battiest, PA-C   History  Substance Use Topics  . Smoking status: Former Smoker -- 1.00 packs/day for 10 years    Types: Cigarettes    Start date: 06/17/1954    Quit date: 06/16/1964  . Smokeless tobacco: Never Used  . Alcohol Use: No      Review of Systems  Constitutional: Positive for appetite change (not as good as before) and fatigue (mild). Negative for fever.  HENT: Negative for sinus pressure and trouble swallowing.   Eyes: Negative.   Respiratory: Positive for shortness of breath and wheezing (at times). Negative for cough.   Cardiovascular: Positive for leg swelling. Negative for chest pain, palpitations, orthopnea and PND.  Gastrointestinal: Positive for vomiting and abdominal pain (twice in the last 2 weeks). Negative for nausea and constipation.  Endocrine: Negative.   Genitourinary: Positive for frequency (wake up every 2-3 hours due to prostate enlargement). Negative for dysuria.  Musculoskeletal: Negative for myalgias, back pain, muscle weakness and neck pain.  Skin: Negative.   Allergic/Immunologic: Negative.   Neurological: Negative for dizziness,  weakness, numbness and headaches.  Hematological: Negative for adenopathy. Bruises/bleeds easily.  Psychiatric/Behavioral: Positive for sleep disturbance. The patient is not nervous/anxious.        Objective:   Physical Exam  Constitutional: He is oriented to person, place, and time. He appears well-developed and well-nourished.  HENT:  Head: Normocephalic and atraumatic.  Eyes: Conjunctivae are normal. Pupils are equal, round, and reactive to light.  Neck: Normal range of motion. Neck supple.  Cardiovascular: Normal rate and regular rhythm.   No murmur heard. Pulmonary/Chest: Effort normal and breath sounds normal. He has no wheezes. He has no rales.  Abdominal: Soft. He exhibits no distension. There is no tenderness.  Musculoskeletal: He exhibits edema (1+ pitting edema in bilateral ankles). He exhibits no tenderness.  Neurological: He is alert and oriented to person, place, and time.  Skin: Skin is warm and dry.  Psychiatric: He has a normal mood and affect. His behavior is normal.  Nursing note and vitals reviewed.  BP 100/60 mmHg  Pulse 88  Resp 20  Ht 6' (1.829 m)  Wt 245 lb (111.131 kg)  BMI 33.22 kg/m2  SpO2 90%         Assessment & Plan:  1: Chronic heart failure with preserved ejection fraction- Patient presents with stable symptoms at this  time. He does say that he gets short of breath and tired with some exertion. When he experiences those symptoms, he will stop to rest until the symptoms subside and then he can finish out the activity. He does experience swelling in his lower legs/ankles and does wear compression socks daily. He does try to elevate his legs at home but admits that he doesn't do it consistently. Encouraged him to elevate his legs as much as possible. He does say that the swelling goes down overnight. He is already weighing every morning and he was instructed to call for an overnight weight gain of >2 pounds or a weekly weight gain of >5 pounds. He  does not add any salt to his food and rarely eats out. Written information regarding a  sodium diet was given to him.  2: COPD- Symptoms are stable at this time. Using multiple inhalers along with participating in a research study. Does wear oxygent at 2L as needed. 3: Sleep apnea- Does wear his CPAP nightly but says that it dries out his mouth terribly and he'll have to remove it after 4-5 hours. He does put it on if he takes a nap during the day.  4: Abdominal pain- He says that twice over the last 2 weeks, he's had a sudden sharp pain in his lower epigastric area. He has taken a total of 4 TUMS with each episode and the pain went away. Does have a history of a duodenal ulcer in the past so he was advised to followup with his PCP should this continue or get worse.   Return in 1 month or sooner for any questions/problems before then.

## 2014-09-09 NOTE — Telephone Encounter (Signed)
Patient requesting 90-day supply of Finasteride 5mg  to be sent to his mail order prescription company, [employer].

## 2014-09-09 NOTE — Patient Instructions (Signed)
Continue weighing daily and call for an overnight weight gain of >2 pounds or a weekly weight gain of >5 pounds.   Follow-up with Dr. Dareen Piano should the abdominal pain continue or worsen.

## 2014-09-09 NOTE — Telephone Encounter (Signed)
Medication sent to mail order. Cw,lpn

## 2014-09-09 NOTE — Patient Outreach (Signed)
Called to follow up with Mr. Favila. He reports that he is doing well. Reports that his productive cough is improved; he is still coughing some throughout the day, but not having trouble with the phlegm like before. Reports that he did speak with Dr. Dema Severin about this cough.  Reports that he has had two episodes of stomach pain in the past two weeks. Reports that each time this was resolved when he took Tums. Discussed the patient's diet and counseled patient on foods and beverages that can exacerbate stomach acid reflux. Advised patient that if he continues to have these symptoms occur, to follow up with his PCP.  Mr. Rennels reports that he did get his Flovent inhaler through mail order and that he has been using it twice daily. Reports that this is the Flovent 110 mcg/actuation strength. Counseled patient about the importance of rinsing his mouth out after each use. Also confirmed that patient is continuing to use his hand held inhalers correctly, holding his breath after doses.  Mr. Haynesworth reports that he spoke with his Urologist about having stopped his finasteride and that he was advised to give it another trial. Reports that he has been taking it now without issues of dizziness.   Mr. Truss reports that he has no further questions for me at this time. Confirmed that he has my contact information in case he has further medication questions in the future.  Duanne Moron, PharmD Clinical Pharmacist [facility] Care Management [phone]

## 2014-09-10 ENCOUNTER — Encounter: Payer: Commercial Managed Care - HMO | Admitting: Internal Medicine

## 2014-09-10 ENCOUNTER — Other Ambulatory Visit: Payer: Self-pay | Admitting: *Deleted

## 2014-09-11 ENCOUNTER — Other Ambulatory Visit: Payer: Self-pay | Admitting: *Deleted

## 2014-09-11 NOTE — Patient Outreach (Signed)
RNCM received a call from pt related to a natural medication for his abdominal discomfort. Pt relayed he had physillium fiber pills and wondered if this would be helpful for his abdominal pain. RNCM discussed with pt the uses of fiber and the importance of talking with the pts MD about reoccurring abdominal pain to obtain the correct type of remedy. Pt stated he had already called to obtain an appointment with his PCP and plans to be seen tomorrow.   Plan:  RNCM plans to see pt on regularly scheduled home visit.   Costella Hatcher RN, [name]  [facility] Care Management [phone])

## 2014-09-11 NOTE — Patient Outreach (Signed)
[facility]) Care Management   09/11/2014  Andre Hayes 11-28-1940 [id number]  Andre Hayes is an 74 y.o. male  Subjective:"I am doing much better but I have been having bad stomach aches like I did when I had an ulcer years ago." "I really need some more education on a low salt diet"   Objective:Blood pressure 130/70, pulse 72, resp. rate 18, weight 244 lb (110.678 kg), SpO2 94 % on room air.    Review of Systems  Respiratory: Positive for cough.   All other systems reviewed and are negative.   Physical Exam  Constitutional: He is oriented to person, place, and time. He appears well-developed and well-nourished.  Cardiovascular: Normal rate, regular rhythm and normal heart sounds.   Respiratory: Effort normal and breath sounds normal.  Productive cough noted  GI: Soft. Bowel sounds are normal.  Musculoskeletal: Normal range of motion. He exhibits no edema.  Neurological: He is alert and oriented to person, place, and time.  Skin: Skin is warm and dry.  Psychiatric: He has a normal mood and affect. His behavior is normal. Judgment and thought content normal.    Current Medications:   Current Outpatient Prescriptions  Medication Sig Dispense Refill  . Albuterol Sulfate (PROAIR HFA IN) Inhale 2 puffs into the lungs 4 (four) times daily as needed.    Marland Kitchen albuterol (PROVENTIL HFA;VENTOLIN HFA) 108 (90 BASE) MCG/ACT inhaler Inhale 1-2 puffs into the lungs 4 (four) times daily as needed for shortness of breath.     Marland Kitchen albuterol (PROVENTIL) (2.5 MG/3ML) 0.083% nebulizer solution Take 2.5 mg by nebulization 2 (two) times daily.    Marland Kitchen aspirin EC 81 MG tablet Take 81 mg by mouth daily.    . clopidogrel (PLAVIX) 75 MG tablet Take 75 mg by mouth daily.    Marland Kitchen docusate sodium (COLACE) 100 MG capsule Take 100 mg by mouth daily.    . finasteride (PROSCAR) 5 MG tablet Take 1 tablet (5 mg total) by mouth daily. 90 tablet 0  . fluticasone (FLOVENT HFA) 110 MCG/ACT inhaler Inhale  1 puff into the lungs 2 (two) times daily.    . fluticasone (FLOVENT HFA) 44 MCG/ACT inhaler Inhale 1 puff into the lungs 2 (two) times daily. (Patient not taking: Reported on 09/09/2014) 1 Inhaler 12  . furosemide (LASIX) 20 MG tablet Take 40 mg by mouth daily.    . hydroxychloroquine (PLAQUENIL) 200 MG tablet Take 200 mg by mouth every other day.    . ipratropium-albuterol (DUONEB) 0.5-2.5 (3) MG/3ML SOLN Inhale 3 mLs into the lungs every 4 (four) hours as needed.    . isosorbide mononitrate (IMDUR) 30 MG 24 hr tablet Take 30 mg by mouth daily.    . research study medication Inhale 1 puff into the lungs 2 (two) times daily.    . simvastatin (ZOCOR) 40 MG tablet Take 40 mg by mouth daily.    . tamsulosin (FLOMAX) 0.4 MG CAPS capsule Take 0.8 mg by mouth daily.    Marland Kitchen tiotropium (SPIRIVA) 18 MCG inhalation capsule Place 18 mcg into inhaler and inhale daily.     No current facility-administered medications for this visit.    Functional Status:   In your present state of health, do you have any difficulty performing the following activities: 09/09/2014 07/26/2014  Hearing? N -  Vision? Y -  Difficulty concentrating or making decisions? Y -  Walking or climbing stairs? N -  Dressing or bathing? N -  Doing errands, shopping? N -  Preparing Food and eating ? - N  Using the Toilet? - N  In the past six months, have you accidently leaked urine? - N  Do you have problems with loss of bowel control? - N  Managing your Medications? - N  Managing your Finances? - N  Housekeeping or managing your Housekeeping? - Y    Fall/Depression Screening:    PHQ 2/9 Scores 09/09/2014 07/26/2014 06/17/2014  PHQ - 2 Score 1 0 0  Exception Documentation - - Other- indicate reason in comment box  Not completed - - pt states no feelings of depression    Assessment: A&Ox3. Lungs clear bilat. Productive cough noted during conversation with pt. Heart sounds normal. No edema noted to lower extremities. Abd soft with  poss BSx4. Pt weight WNL of baseline. Pt denies SOB at rest but does report SOB with exertion. Pt c/o having episodes of epigastric pain. He reports 2x it was relieved by [facility] and once it took several doses of TUMs to subside. Pt is concerned related to the fact he has a history of an ulcer. In talking with pt about heart failure and low salt diet pt noted to be unaware of how to recognize sodium in foods and which foods are high in sodium.     Plan:  RNCM will print and review with pt EMMI education on low sodium diet. RNCM encouraged pt to call his regular MD to report episodes of abdominal pain. RNCM will see pt in one month for f/u and discuss possible d/c from care management services.  Costella Hatcher RN, [name]  [facility] Care Management [phone])

## 2014-09-12 ENCOUNTER — Other Ambulatory Visit: Payer: Self-pay | Admitting: Internal Medicine

## 2014-09-12 DIAGNOSIS — R1013 Epigastric pain: Secondary | ICD-10-CM

## 2014-09-12 DIAGNOSIS — R109 Unspecified abdominal pain: Secondary | ICD-10-CM

## 2014-09-19 ENCOUNTER — Ambulatory Visit: Payer: Commercial Managed Care - HMO

## 2014-09-23 ENCOUNTER — Telehealth: Payer: Self-pay | Admitting: *Deleted

## 2014-09-23 DIAGNOSIS — J441 Chronic obstructive pulmonary disease with (acute) exacerbation: Secondary | ICD-10-CM

## 2014-09-23 NOTE — Telephone Encounter (Signed)
Order placed for PFT. 

## 2014-09-25 ENCOUNTER — Ambulatory Visit: Payer: Commercial Managed Care - HMO | Admitting: Internal Medicine

## 2014-09-25 ENCOUNTER — Ambulatory Visit: Payer: Commercial Managed Care - HMO

## 2014-09-25 ENCOUNTER — Encounter: Payer: Commercial Managed Care - HMO | Admitting: Internal Medicine

## 2014-10-01 ENCOUNTER — Ambulatory Visit (INDEPENDENT_AMBULATORY_CARE_PROVIDER_SITE_OTHER): Payer: Commercial Managed Care - HMO | Admitting: Internal Medicine

## 2014-10-01 ENCOUNTER — Encounter: Payer: Self-pay | Admitting: Internal Medicine

## 2014-10-01 VITALS — BP 122/56 | HR 86 | Ht 72.0 in | Wt 247.0 lb

## 2014-10-01 DIAGNOSIS — J441 Chronic obstructive pulmonary disease with (acute) exacerbation: Secondary | ICD-10-CM

## 2014-10-01 LAB — PULMONARY FUNCTION TEST
DL/VA % PRED: 96 %
DL/VA: 4.54 ml/min/mmHg/L
DLCO UNC % PRED: 88 %
DLCO UNC: 31.13 ml/min/mmHg
FEF 25-75 Post: 0.8 L/sec
FEF 25-75 Pre: 0.42 L/sec
FEF2575-%CHANGE-POST: 90 %
FEF2575-%PRED-POST: 33 %
FEF2575-%Pred-Pre: 17 %
FEV1-%Change-Post: 18 %
FEV1-%Pred-Post: 39 %
FEV1-%Pred-Pre: 33 %
FEV1-PRE: 1.12 L
FEV1-Post: 1.33 L
FEV1FVC-%Change-Post: 0 %
FEV1FVC-%Pred-Pre: 76 %
FEV6-%CHANGE-POST: 19 %
FEV6-%PRED-POST: 54 %
FEV6-%Pred-Pre: 45 %
FEV6-POST: 2.36 L
FEV6-Pre: 1.98 L
FEV6FVC-%Change-Post: 1 %
FEV6FVC-%Pred-Post: 106 %
FEV6FVC-%Pred-Pre: 105 %
FVC-%Change-Post: 18 %
FVC-%PRED-POST: 51 %
FVC-%Pred-Pre: 43 %
FVC-POST: 2.36 L
FVC-Pre: 2 L
POST FEV6/FVC RATIO: 100 %
PRE FEV6/FVC RATIO: 99 %
Post FEV1/FVC ratio: 56 %
Pre FEV1/FVC ratio: 56 %

## 2014-10-01 NOTE — Progress Notes (Signed)
PFT performed today. 

## 2014-10-01 NOTE — Progress Notes (Signed)
MRN# [id number] Andre Hayes 07-06-1940   CC: Chief Complaint  Patient presents with  . Follow-up    breathing doing; SMW/PFT today/ Wants to know if breathing will ever get better?      Brief History: HPI - 08/22/2014 Patient is a pleasant 74 year old male is seen in consultation for COPD optimization and transition of care from Dr. Lillette Hayes office. Patient was seen as a consult during hospitalization. See note below. Patient stated during hospitalization his son was very upset with Dr. Mayo Hayes, and Dr. Meredeth Hayes suggested that the patient no longer see him given his altercation with his son. Patient states that overall Dr. Mayo Hayes has been a very good doctor to him and he is unsure of the altercation that took place, but he will discuss with Dr. Mayo Hayes and possible continue to follow with Dr. Mayo Hayes.Patient Came to the office today in keeping with his hospital followup appointments. Patient states overall he is doing well today, he is on 2 L of oxygen during the day, he does not wear his oxygen with CPAP. He does still endorse some dyspnea on exertion, and is getting physical therapy 3 times per week. For full details of shortness of breath and COPD see hospitalization as stated below. Patient's past medical history of diastolic heart failure along with obstructive sleep apnea. He states that he wears a CPAP machine 7 nights per week for middle of 6 hours per night. Patient states that he currently lives alone, his son lives in [place], however he is not ready to move to give up his independence yet.  Assessment and Plan:74 year old male with past medical history of diastolic heart failure, preserved ejection fraction, COPD, seen in consultation for hospital followup. COPD exacerbation Now resolving. Most likely due to diastolic heart failure exacerbation.   Plan:  -Pulmonary function testing and 6 minute walk test prior to follow-up visit. -Albuterol nebulizer, one ampule in  the morning and one ampule in the evening. May use during the day as needed for shortness of breath/wheezing/coughing -Continue with Spiriva -Please bring your Spiriva research papers at next visit -After pulmonary function test and 6 minute walk test we will determine if a referral is needed for pulmonary rehabilitation -after your next visit we will determine on overnight pulse oximetry testing -Continue wearing your CPAP nightly -Continue with physical therapy and exercise as tolerated.   Events since last clinic visit: Patient presents today for a follow-up visit of his COPD. At his last visit he was seen as a hospital follow-up for her story failure secondary to CHF and COPD exacerbation. Today patient states that he is doing well. He exercises about 40 minutes per day, he has completed his physical therapy and occupational outpatient therapy. He is avoiding any forms of tobacco. He is monitoring his weight weekly, and is trying actively to lose weight. He takes albuterol schedule twice daily via nebulizer, and states he takes his Flovent and Spiriva daily also. Overall he has good clinical improvement since his last visit.  She also stated that at this visit that he will not be returning to Dr. Lillette Hayes office, and will continue care with [facility]   Medication:   Current Outpatient Rx  Name  Route  Sig  Dispense  Refill  . albuterol (PROVENTIL HFA;VENTOLIN HFA) 108 (90 BASE) MCG/ACT inhaler   Inhalation   Inhale 1-2 puffs into the lungs 4 (four) times daily as needed for shortness of breath.          Marland Kitchen albuterol (  PROVENTIL) (2.5 MG/3ML) 0.083% nebulizer solution   Nebulization   Take 2.5 mg by nebulization 2 (two) times daily.         . Albuterol Sulfate (PROAIR HFA IN)   Inhalation   Inhale 2 puffs into the lungs 4 (four) times daily as needed.         Marland Kitchen aspirin EC 81 MG tablet   Oral   Take 81 mg by mouth daily.         . clopidogrel (PLAVIX) 75 MG  tablet   Oral   Take 75 mg by mouth daily.         Marland Kitchen docusate sodium (COLACE) 100 MG capsule   Oral   Take 100 mg by mouth daily.         . finasteride (PROSCAR) 5 MG tablet   Oral   Take 1 tablet (5 mg total) by mouth daily.   90 tablet   0   . fluticasone (FLOVENT HFA) 110 MCG/ACT inhaler   Inhalation   Inhale 1 puff into the lungs 2 (two) times daily.         . furosemide (LASIX) 20 MG tablet   Oral   Take 40 mg by mouth daily.         . hydroxychloroquine (PLAQUENIL) 200 MG tablet   Oral   Take 200 mg by mouth every other day.         . ipratropium-albuterol (DUONEB) 0.5-2.5 (3) MG/3ML SOLN   Inhalation   Inhale 3 mLs into the lungs every 4 (four) hours as needed.         . isosorbide mononitrate (IMDUR) 30 MG 24 hr tablet   Oral   Take 30 mg by mouth daily.         . research study medication   Inhalation   Inhale 1 puff into the lungs 2 (two) times daily.         . simvastatin (ZOCOR) 40 MG tablet   Oral   Take 40 mg by mouth daily.         . tamsulosin (FLOMAX) 0.4 MG CAPS capsule   Oral   Take 0.8 mg by mouth daily.         Marland Kitchen tiotropium (SPIRIVA) 18 MCG inhalation capsule   Inhalation   Place 18 mcg into inhaler and inhale daily.            Review of Systems: Gen:  Denies  fever, sweats, chills HEENT: Denies blurred vision, double vision, ear pain, eye pain, hearing loss, nose bleeds, sore throat Cvc:  No dizziness, chest pain or heaviness Resp:   Admits to: Shortness of breath (improving) chronic lower extremity swelling. Gi: Denies swallowing difficulty, stomach pain, nausea or vomiting, diarrhea, constipation, bowel incontinence Gu:  Denies bladder incontinence, burning urine Ext:   No Joint pain, stiffness or swelling Skin: No skin rash, easy bruising or bleeding or hives Endoc:  No polyuria, polydipsia , polyphagia or weight change Other:  All other systems negative  Allergies:  Erythromycin and Erythromycin  base  Physical Examination:  VS: BP 122/56 mmHg  Pulse 86  Ht 6' (1.829 m)  Wt 247 lb (112.038 kg)  BMI 33.49 kg/m2  SpO2 92%  General Appearance: No distress  HEENT: PERRLA, no ptosis, no other lesions noticed Pulmonary: Mild decreased breath sounds at the bilateral bases, no wheezes, no significant crackles appreciated.   Cardiovascular:  Normal S1,S2.  No m/r/g.     Abdomen:Exam: Benign, Soft,  non-tender, No masses  Skin:   warm, no rashes, no ecchymosis  Extremities: normal, no cyanosis, clubbing, warm with normal capillary refill.    Pulmonary function testing 10/01/2014 FVC 43% FEV1 33% FEV1/FVC 56% ERV 56% DLCO uncorrected 80% Impression: Very severe obstruction with significant broncho-dilator response.   Assessment and Plan: 74 year old male with past medical history of COPD (former Dr. Meredeth Hayes patient) seen for follow-up visit today. COPD exacerbation Resolving well, almost back to baseline breathing.  Most likely due to diastolic heart failure exacerbation, which is also resolving PFTs today showed very severe obstruction with significant response to bronchodilation is, normal DLCO. 6 minute walk test: 192 m/629 feet, lowest saturation 89%, highest heart rate 100. Today we discussed since he is doing very well, he will not require pulmonary rehabilitation at this time.  Plan:   -Albuterol nebulizer, one ampule in the morning and one ampule in the evening. May use during the day as needed for shortness of breath/wheezing/coughing -Continue with Spiriva -Continue wearing your CPAP nightly -Continue with physical therapy and exercise as tolerated.  MMRC=1 CAT=14    Updated Medication List Outpatient Encounter Prescriptions as of 10/01/2014  Medication Sig  . albuterol (PROVENTIL HFA;VENTOLIN HFA) 108 (90 BASE) MCG/ACT inhaler Inhale 1-2 puffs into the lungs 4 (four) times daily as needed for shortness of breath.   Marland Kitchen albuterol (PROVENTIL) (2.5 MG/3ML) 0.083%  nebulizer solution Take 2.5 mg by nebulization 2 (two) times daily.  . Albuterol Sulfate (PROAIR HFA IN) Inhale 2 puffs into the lungs 4 (four) times daily as needed.  Marland Kitchen aspirin EC 81 MG tablet Take 81 mg by mouth daily.  . clopidogrel (PLAVIX) 75 MG tablet Take 75 mg by mouth daily.  Marland Kitchen docusate sodium (COLACE) 100 MG capsule Take 100 mg by mouth daily.  . finasteride (PROSCAR) 5 MG tablet Take 1 tablet (5 mg total) by mouth daily.  . fluticasone (FLOVENT HFA) 110 MCG/ACT inhaler Inhale 1 puff into the lungs 2 (two) times daily.  . furosemide (LASIX) 20 MG tablet Take 40 mg by mouth daily.  . hydroxychloroquine (PLAQUENIL) 200 MG tablet Take 200 mg by mouth every other day.  . ipratropium-albuterol (DUONEB) 0.5-2.5 (3) MG/3ML SOLN Inhale 3 mLs into the lungs every 4 (four) hours as needed.  . isosorbide mononitrate (IMDUR) 30 MG 24 hr tablet Take 30 mg by mouth daily.  . research study medication Inhale 1 puff into the lungs 2 (two) times daily.  . simvastatin (ZOCOR) 40 MG tablet Take 40 mg by mouth daily.  . tamsulosin (FLOMAX) 0.4 MG CAPS capsule Take 0.8 mg by mouth daily.  Marland Kitchen tiotropium (SPIRIVA) 18 MCG inhalation capsule Place 18 mcg into inhaler and inhale daily.  . [DISCONTINUED] fluticasone (FLOVENT HFA) 44 MCG/ACT inhaler Inhale 1 puff into the lungs 2 (two) times daily. (Patient not taking: Reported on 09/09/2014)   No facility-administered encounter medications on file as of 10/01/2014.    Orders for this visit: No orders of the defined types were placed in this encounter.    Thank  you for the visitation and for allowing  [facility] to assist in the care of your patient. Our recommendations are noted above.  Please contact us if we can be of further service.  Stephanie Acre, MD [facility] Office Number: [phone]

## 2014-10-01 NOTE — Patient Instructions (Signed)
Follow up with Dr. Dema Severin in 48months - cont with current COPD meds - cont with weekly wt. Check - cont with diet and exercise, as tolerated - avoid all forms of smoking.

## 2014-10-01 NOTE — Progress Notes (Signed)
SMW performed today. 

## 2014-10-02 NOTE — Assessment & Plan Note (Addendum)
Resolving well, almost back to baseline breathing.  Most likely due to diastolic heart failure exacerbation, which is also resolving PFTs today showed very severe obstruction with significant response to bronchodilation is, normal DLCO. 6 minute walk test: 192 m/629 feet, lowest saturation 89%, highest heart rate 100. Today we discussed since he is doing very well, he will not require pulmonary rehabilitation at this time.  Plan:   -Albuterol nebulizer, one ampule in the morning and one ampule in the evening. May use during the day as needed for shortness of breath/wheezing/coughing -Continue with Spiriva -Continue wearing your CPAP nightly -Continue with physical therapy and exercise as tolerated.  MMRC=1 CAT=14

## 2014-10-08 ENCOUNTER — Other Ambulatory Visit: Payer: Self-pay | Admitting: *Deleted

## 2014-10-08 NOTE — Patient Outreach (Signed)
[facility]) Care Management   10/08/2014  Andre Hayes 1940-11-10 [id number]  Andre Hayes is an 74 y.o. male  Subjective: "I saw my lung doctor and he told me to start walking along with my daily exercise routine." "I have been keeping up with my weight but I was not really making sure I wasn't gaining 5lbs in one week."   Objective: Blood pressure 116/66, pulse 88, resp. rate 18, weight 245 lb (111.131 kg), SpO2 96 % on room air. )Review of Systems  Respiratory: Positive for cough and sputum production.        Pt reports clear sputum and no increase in cough.  Cardiovascular: Positive for leg swelling. Negative for chest pain.  Gastrointestinal: Negative for heartburn.  Musculoskeletal: Negative for falls.    Physical Exam  Constitutional: He is oriented to person, place, and time. He appears well-developed and well-nourished.  Cardiovascular: Normal rate and regular rhythm.   Pulses:      Dorsalis pedis pulses are 2+ on the right side, and 2+ on the left side.  Respiratory: Effort normal and breath sounds normal.  GI: Soft. Bowel sounds are normal.  Musculoskeletal: Normal range of motion.       Right lower leg: He exhibits swelling.       Left lower leg: He exhibits swelling.       Legs: Neurological: He is alert and oriented to person, place, and time.  Skin: Skin is warm, dry and intact.     Psychiatric: He has a normal mood and affect. His speech is normal and behavior is normal. Judgment and thought content normal. Cognition and memory are normal.    Current Medications:   Current Outpatient Prescriptions  Medication Sig Dispense Refill  . pantoprazole (PROTONIX) 40 MG tablet Take 40 mg by mouth daily.    Marland Kitchen albuterol (PROVENTIL HFA;VENTOLIN HFA) 108 (90 BASE) MCG/ACT inhaler Inhale 1-2 puffs into the lungs 4 (four) times daily as needed for shortness of breath.     Marland Kitchen albuterol (PROVENTIL) (2.5 MG/3ML) 0.083% nebulizer solution Take 2.5 mg by  nebulization 2 (two) times daily.    . Albuterol Sulfate (PROAIR HFA IN) Inhale 2 puffs into the lungs 4 (four) times daily as needed.    Marland Kitchen aspirin EC 81 MG tablet Take 81 mg by mouth daily.    . clopidogrel (PLAVIX) 75 MG tablet Take 75 mg by mouth daily.    Marland Kitchen docusate sodium (COLACE) 100 MG capsule Take 100 mg by mouth daily.    . finasteride (PROSCAR) 5 MG tablet Take 1 tablet (5 mg total) by mouth daily. 90 tablet 0  . fluticasone (FLOVENT HFA) 110 MCG/ACT inhaler Inhale 1 puff into the lungs 2 (two) times daily.    . furosemide (LASIX) 20 MG tablet Take 40 mg by mouth daily.    . hydroxychloroquine (PLAQUENIL) 200 MG tablet Take 200 mg by mouth every other day.    . ipratropium-albuterol (DUONEB) 0.5-2.5 (3) MG/3ML SOLN Inhale 3 mLs into the lungs every 4 (four) hours as needed.    . isosorbide mononitrate (IMDUR) 30 MG 24 hr tablet Take 30 mg by mouth daily.    . research study medication Inhale 1 puff into the lungs 2 (two) times daily.    . simvastatin (ZOCOR) 40 MG tablet Take 40 mg by mouth daily.    . tamsulosin (FLOMAX) 0.4 MG CAPS capsule Take 0.8 mg by mouth daily.    Marland Kitchen tiotropium (SPIRIVA) 18 MCG inhalation  capsule Place 18 mcg into inhaler and inhale daily.     No current facility-administered medications for this visit.    Functional Status:   In your present state of health, do you have any difficulty performing the following activities: 09/09/2014 07/26/2014  Hearing? N -  Vision? Y -  Difficulty concentrating or making decisions? Y -  Walking or climbing stairs? N -  Dressing or bathing? N -  Doing errands, shopping? N -  Quarry manager and eating ? - N  Using the Toilet? - N  In the past six months, have you accidently leaked urine? - N  Do you have problems with loss of bowel control? - N  Managing your Medications? - N  Managing your Finances? - N  Housekeeping or managing your Housekeeping? - Y    Fall/Depression Screening:    PHQ 2/9 Scores 09/09/2014  07/26/2014 06/17/2014  PHQ - 2 Score 1 0 0  Exception Documentation - - Other- indicate reason in comment box  Not completed - - pt states no feelings of depression    Assessment: See physical assessment as above: HF: Pt weighing but on weight log RNCM noted pt had a few days earlier in the month he had gained >5 lbs in a week. RNCM pointed this out to pt and reinforced the need to call the MD for increased weight which correlated with fluid gain. Pt voiced understanding. Pt had since lost the 5lbs. Lower legs slightly swollen. Denies increased SOB. Using Cpap at night.   COPD: On room air during visit. Pt reports at night he needed his O2 and when going out for longer trips. Lungs clear during this visit. Pt noted to cough up sputum during visit, pt reports no color to it and denies increase in SOB or cough. Pt stated he had seen his pulmonologist and decided not to do the pulmonary rehab, but instead to invest in a pedometer and increase his walking around his house.   General: Pt concerned about bill he received from [facility] and requested RNCM see if the amount was correct, related to a phone call he had made to the insurance company stating "something was coded wrong." RNCM contacted billing at [facility] and pt had a zero balance. RNCM contacted [facility] department and was transferred to [facility], message left for Huntley Dec to please call back during pt visit but did not receive a call back. Pt stated he had spoken to billing, insurance company and had actually drove to billing department related to this issue and it had still not been resolved. RNCM let pt know she would not be able to resolve this issue with out being at his home related to HIPPA. Pt voiced understanding. Discussed with pt that he was doing very well caring for himself and would be able to graduate from the [facility] care management services at next home visit if he continues to do well.   Plan:  RNCM will see pt next month with plans to  d/c. RNCM will look for more home exercise information for per pt request.  Brittanee Ghazarian RN, [name]  [facility] Care Management [phone])

## 2014-10-09 ENCOUNTER — Ambulatory Visit: Payer: Commercial Managed Care - HMO | Admitting: Family

## 2014-10-18 ENCOUNTER — Telehealth: Payer: Self-pay | Admitting: Internal Medicine

## 2014-10-18 NOTE — Telephone Encounter (Signed)
Flovent inhaler costs over $100, pt wants to know if we can call in another inhaler that is cheaper (Generic), but same/similar to the Flovent.  He says that he is almost out of his Flovent and needs something as soon as possible.  Dr. Dema Severin - please advise.

## 2014-10-18 NOTE — Telephone Encounter (Signed)
Patient can call his insurance and findout what they cover that is cheaper. We will be happy to write a rx of what the insurance covers. This was also explained to the patient at his last office visit.   Thank you

## 2014-10-18 NOTE — Telephone Encounter (Signed)
Pt called because he has not gotten a call back from [place] yet. I informed pt that a message was sent to VM. Pt wants to talk to nurse.  [phone]

## 2014-10-18 NOTE — Telephone Encounter (Signed)
Spoke with the pt He states needing alternative asap as he is out of med Misty to inform VM this msg needs to be addressed today  Please advise, thanks!

## 2014-10-18 NOTE — Telephone Encounter (Signed)
Called made pt aware of recs. He verbalized understanding and will call his insurance.

## 2014-10-23 ENCOUNTER — Telehealth: Payer: Self-pay

## 2014-10-23 DIAGNOSIS — N400 Enlarged prostate without lower urinary tract symptoms: Secondary | ICD-10-CM

## 2014-10-23 MED ORDER — TAMSULOSIN HCL 0.4 MG PO CAPS: 0.4000 mg | ORAL_CAPSULE | Freq: Two times a day (BID) | ORAL | Status: DC

## 2014-10-23 NOTE — Telephone Encounter (Signed)
Tamsulosin bid was refilled. Spoke with pt and made aware medication was not safe to take tid. Pt voiced understanding.

## 2014-10-23 NOTE — Telephone Encounter (Signed)
Pt called requesting a refill on tamsulosin. Pt states that a month ago while in the hospital at [facility] for MI the attending doctor rx tamsulosin tid. Pt states three a day is working well for him and would like his script changed from bid to tid. Please advise.

## 2014-10-23 NOTE — Telephone Encounter (Signed)
It is NOT safe to be taking tid.  Twice a day is the maximum.  Sorry.  He can have refills for bid as needed.    Vanna Scotland, MD

## 2014-10-29 ENCOUNTER — Telehealth: Payer: Self-pay | Admitting: Urology

## 2014-10-29 DIAGNOSIS — N400 Enlarged prostate without lower urinary tract symptoms: Secondary | ICD-10-CM

## 2014-10-29 MED ORDER — TAMSULOSIN HCL 0.4 MG PO CAPS: 0.4000 mg | ORAL_CAPSULE | Freq: Two times a day (BID) | ORAL | Status: DC

## 2014-10-29 NOTE — Telephone Encounter (Signed)
Patient called stating that he was not going to get his refill of Tamsulosin from his mail order pharmacy in time before he leaves on vacation and is out of his medication and would like a 30day temporary script sent to his local pharm. This was done electronically

## 2014-11-13 ENCOUNTER — Other Ambulatory Visit: Payer: Self-pay | Admitting: *Deleted

## 2014-11-13 NOTE — Patient Outreach (Signed)
[facility]) Care Management   11/13/2014  Andre Hayes October 04, 1940 [id number]  Andre Hayes is an 74 y.o. male  Subjective: "I just returned home from [place] and we ate out a lot while I was there." " I did really well on while I was there, but I noticed I have gained 5 pounds." "I am continuing to do my exercises every morning"  Objective: Blood pressure 136/72, pulse 78, resp. rate 18, height 1.829 m (6'), weight 252 lb (114.306 kg), SpO2 98 % after purse lip breathing.  Review of Systems  Cardiovascular: Positive for leg swelling.  Musculoskeletal: Negative for falls.  All other systems reviewed and are negative.   Physical Exam  Constitutional: He is oriented to person, place, and time. He appears well-developed and well-nourished.  Cardiovascular: Normal rate, regular rhythm and normal heart sounds.   Respiratory: Effort normal and breath sounds normal.  GI: Soft. Bowel sounds are normal.  Musculoskeletal: Normal range of motion.       Right ankle: He exhibits swelling.       Left ankle: He exhibits swelling.  Neurological: He is alert and oriented to person, place, and time.  Skin: Skin is warm, dry and intact.     Psychiatric: He has a normal mood and affect. His behavior is normal. Judgment and thought content normal.    Current Medications:  Medications reviewed please see encounter for currently taking meds.  Current Outpatient Prescriptions  Medication Sig Dispense Refill  . albuterol (PROVENTIL HFA;VENTOLIN HFA) 108 (90 BASE) MCG/ACT inhaler Inhale 1-2 puffs into the lungs 4 (four) times daily as needed for shortness of breath.     Marland Kitchen albuterol (PROVENTIL) (2.5 MG/3ML) 0.083% nebulizer solution Take 2.5 mg by nebulization 2 (two) times daily.    . Albuterol Sulfate (PROAIR HFA IN) Inhale 2 puffs into the lungs 4 (four) times daily as needed.    Marland Kitchen aspirin EC 81 MG tablet Take 81 mg by mouth daily.    . clopidogrel (PLAVIX) 75 MG tablet Take 75  mg by mouth daily.    Marland Kitchen docusate sodium (COLACE) 100 MG capsule Take 100 mg by mouth daily.    . fluticasone (FLOVENT HFA) 110 MCG/ACT inhaler Inhale 1 puff into the lungs 2 (two) times daily.    . furosemide (LASIX) 20 MG tablet Take 40 mg by mouth daily.    . hydroxychloroquine (PLAQUENIL) 200 MG tablet Take 200 mg by mouth every other day.    . isosorbide mononitrate (IMDUR) 30 MG 24 hr tablet Take 30 mg by mouth daily.    . pantoprazole (PROTONIX) 40 MG tablet Take 40 mg by mouth daily.    . simvastatin (ZOCOR) 40 MG tablet Take 40 mg by mouth daily.    . tamsulosin (FLOMAX) 0.4 MG CAPS capsule Take 0.8 mg by mouth daily.    . tamsulosin (FLOMAX) 0.4 MG CAPS capsule Take 1 capsule (0.4 mg total) by mouth 2 times daily at 12 noon and 4 pm. 90 capsule 1  . tamsulosin (FLOMAX) 0.4 MG CAPS capsule Take 1 capsule (0.4 mg total) by mouth 2 (two) times daily. 30 capsule 0  . tiotropium (SPIRIVA) 18 MCG inhalation capsule Place 18 mcg into inhaler and inhale daily.    . finasteride (PROSCAR) 5 MG tablet Take 1 tablet (5 mg total) by mouth daily. (Patient not taking: Reported on 11/13/2014) 90 tablet 0  . ipratropium-albuterol (DUONEB) 0.5-2.5 (3) MG/3ML SOLN Inhale 3 mLs into the lungs every 4 (  four) hours as needed.    . research study medication Inhale 1 puff into the lungs 2 (two) times daily.     No current facility-administered medications for this visit.    Functional Status:   In your present state of health, do you have any difficulty performing the following activities: 09/09/2014 07/26/2014  Hearing? N -  Vision? Y -  Difficulty concentrating or making decisions? Y -  Walking or climbing stairs? N -  Dressing or bathing? N -  Doing errands, shopping? N -  Quarry manager and eating ? - N  Using the Toilet? - N  In the past six months, have you accidently leaked urine? - N  Do you have problems with loss of bowel control? - N  Managing your Medications? - N  Managing your Finances? -  N  Housekeeping or managing your Housekeeping? - Y    Fall/Depression Screening:    PHQ 2/9 Scores 11/13/2014 09/09/2014 07/26/2014 06/17/2014  PHQ - 2 Score 0 1 0 0  Exception Documentation - - - Other- indicate reason in comment box  Not completed Pt states no feelings of depression - - pt states no feelings of depression   Fall Risk  11/13/2014 08/16/2014 07/26/2014 06/17/2014  Falls in the past year? No No No No  Risk for fall due to : - Impaired mobility Impaired mobility Other (Comment)  Risk for fall due to (comments): - - - no risk    Assessment: See physical assessment as above.  General: Pt alert and oriented, with a pleasant affect. Pt excited about recent trip to [place] where he visited his sister and high school friends. Denies falls.  HF: Lungs clear, bilat LE with mild swelling. Denies increased SOB. Pt reports gaining 5 pounds related to dining out and traveling. RNCM encouraged pt to call his doctor. Pt verbalized understanding and voiced he would when [facility] left. Pt has a good understanding of HF s/s.  COPD: Lungs clear. Pt O2sat on room air was 91% before purse lipped breathing 98% after 1 min of purse lipped breathing. Pt conts using O2 via n/c prn and at night. Pt also conts to use C-PAP. Familiar with COPD zones.  Meds: Pt familiar with all medications and their uses. Pt has stopped study medication for COPD and is in touch with pulmonologist who has ordered something to replace study med.   Bill: Pt has made arrangements to pay hospital bill and is satisfied with arrangement.   Discharge: Pt feels comfortable with being d/c'd from [facility] services. Pt aware of how to get back in touch with [facility] if the need should arise.  Plan:  RNCM will send MD d/c letter and route MD this encounter. RNCM will route pt d/c letter to [facility] RNCM resolve episode and remove name from care team  Thaddaeus Granja RN, [name]  [facility] Care Management [phone])

## 2014-11-14 ENCOUNTER — Encounter: Payer: Self-pay | Admitting: *Deleted

## 2014-11-19 NOTE — Patient Outreach (Signed)
[facility]) Care Management  11/19/2014  Andre Hayes [facility] 1940-12-28 [id number]   Notification from [name], RN to close case due to goals met with [name] Care Management.  Thanks, [name]. [place], [place] Assistant Phone: [phone] Fax: [phone]

## 2014-12-09 ENCOUNTER — Telehealth: Payer: Self-pay | Admitting: Urology

## 2014-12-09 DIAGNOSIS — N400 Enlarged prostate without lower urinary tract symptoms: Secondary | ICD-10-CM

## 2014-12-09 NOTE — Telephone Encounter (Signed)
Patient needs refill on Finasteride called in to [facility] [address].

## 2014-12-10 MED ORDER — FINASTERIDE 5 MG PO TABS: 5.0000 mg | ORAL_TABLET | Freq: Every day | ORAL | Status: DC

## 2014-12-10 NOTE — Telephone Encounter (Signed)
Medication sent to pharmacy  

## 2014-12-24 ENCOUNTER — Ambulatory Visit (INDEPENDENT_AMBULATORY_CARE_PROVIDER_SITE_OTHER): Payer: Commercial Managed Care - HMO | Admitting: Obstetrics and Gynecology

## 2014-12-24 ENCOUNTER — Encounter: Payer: Self-pay | Admitting: Obstetrics and Gynecology

## 2014-12-24 VITALS — BP 108/63 | HR 76 | Ht 72.0 in | Wt 256.8 lb

## 2014-12-24 DIAGNOSIS — N400 Enlarged prostate without lower urinary tract symptoms: Secondary | ICD-10-CM

## 2014-12-24 LAB — PR COMPLEX UROFLOWMETRY: Scan Result: 58

## 2014-12-24 LAB — BLADDER SCAN AMB NON-IMAGING: Scan Result: 113

## 2014-12-24 NOTE — Patient Instructions (Addendum)
-  Continue taking you finasteride and flomax daily. -Decrease fluid intake 4 hours prior to bedtime. -Double void prior to going to be and in the morning. Use the bathroom prior to bed then wait 5 minutes and try to go again. -You will follow up with Dr. Apolinar Junes in 2 months to discuss surgery.

## 2014-12-24 NOTE — Progress Notes (Signed)
12/24/2014 8:01 PM   Andre Hayes 1941/01/23 [id number]  Referring provider: Lauro Regulus, MD [address] [facility] [place], [place] [address]  Chief Complaint  Patient presents with  . Benign Prostatic Hypertrophy    6 month recheck  . Nocturia    HPI: Patient is 74 year old male presenting today for follow-up for BPH. Patient reports symptoms of nocturia every 2 hours nightly. He states he is significantly bothered by this. He has been taking both finasteride and Flomax as instructed at previous appointments. He does have a CPAP machine but states that he has difficulty keeping it on all night long.  Previous Complaint History: The patient is a 73 year old male who presents for a recheck of Benign prostatic hyperplasia. Note for "Benign prostatic hyperplasia": 74 yo M followed BPH with LUTs and fluctuating PSA. He presents today for TRUS volume of the prostate.  After last visit, he was counseled to start finasteride but never started this medication. He is also switched to Uroxatral to see if this helps but had side effects which were intolerable. He has since switched back to Flomax.  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   He continues to have nocturia, now x 4 which slightly improved. He has been more compliant with his CPAP and continues to avoid water before bed.  He also has BPH with daytime issues as well which are unchanged since last visit. His currently on Flomax 0.8 mg. He reports urinary frequency during the day q 2-3 hours but no urgency. He does drink a lot of water and coffee throughout the day. He does feel that he is not able to fully empty his bladder at times. Good urinary flow during the day but weak stream at times. Sometimes double voids. No history of dysuria, hematuria or UTIs.   He does also have a history of elevated PSA- most recent PSA 4.23 on 10/10/13  which is stable per review of previous [facility] records. No previous prostate biopsy.   He also has a history of urethral stricture s/p dilation in the [employer] as a young man.     PMH: Past Medical History  Diagnosis Date  . COPD (chronic obstructive pulmonary disease) (HCC)   . Hyperlipidemia   . Pneumonia 05/02/14    not hospitalized  . Rheumatoid arthritis (HCC)   . Pulmonary disease   . CAD (coronary artery disease)   . Congestive heart failure (HCC)     NYHA 3, EF>55%  . Duodenal ulcer   . BPH (benign prostatic hypertrophy)     Surgical History: Past Surgical History  Procedure Laterality Date  . Coronary angioplasty with stent placement    . Lung surgery      Home Medications:    Medication List       This list is accurate as of: 12/24/14  8:01 PM.  Always use your most recent med list.               albuterol 108 (90 BASE) MCG/ACT inhaler  Commonly known as:  PROVENTIL HFA;VENTOLIN HFA  Inhale 1-2 puffs into the lungs 4 (four) times daily as needed for shortness of breath.     PROAIR HFA IN  Inhale 2 puffs into the lungs 4 (four) times daily as needed.     albuterol (2.5 MG/3ML) 0.083% nebulizer solution  Commonly known as:  PROVENTIL  Take 2.5 mg by nebulization 2 (two) times  daily.     aspirin EC 81 MG tablet  Take 81 mg by mouth daily.     BREO ELLIPTA 100-25 MCG/INH Aepb  Generic drug:  Fluticasone Furoate-Vilanterol  Inhale into the lungs.     clopidogrel 75 MG tablet  Commonly known as:  PLAVIX  Take 75 mg by mouth daily.     docusate sodium 100 MG capsule  Commonly known as:  COLACE  Take 100 mg by mouth daily.     finasteride 5 MG tablet  Commonly known as:  PROSCAR  Take 1 tablet (5 mg total) by mouth daily.     fluticasone 110 MCG/ACT inhaler  Commonly known as:  FLOVENT HFA  Inhale 1 puff into the lungs 2 (two) times daily.     furosemide 20 MG tablet  Commonly known as:  LASIX  Take 40 mg by mouth daily.      gabapentin 100 MG capsule  Commonly known as:  NEURONTIN     hydroxychloroquine 200 MG tablet  Commonly known as:  PLAQUENIL  Take 200 mg by mouth every other day.     ipratropium-albuterol 0.5-2.5 (3) MG/3ML Soln  Commonly known as:  DUONEB  Inhale 3 mLs into the lungs every 4 (four) hours as needed.     isosorbide mononitrate 30 MG 24 hr tablet  Commonly known as:  IMDUR  Take 30 mg by mouth daily.     pantoprazole 40 MG tablet  Commonly known as:  PROTONIX  Take 40 mg by mouth daily.     research study medication  Inhale 1 puff into the lungs 2 (two) times daily.     simvastatin 40 MG tablet  Commonly known as:  ZOCOR  Take 40 mg by mouth daily.     tamsulosin 0.4 MG Caps capsule  Commonly known as:  FLOMAX  Take 0.8 mg by mouth daily.     tiotropium 18 MCG inhalation capsule  Commonly known as:  SPIRIVA  Place 18 mcg into inhaler and inhale daily.        Allergies:  Allergies  Allergen Reactions  . Erythromycin Rash  . Erythromycin Base Rash    Family History: Family History  Problem Relation Age of Onset  . Tuberculosis Mother   . COPD Mother   . Emphysema Mother   . Heart disease Father   . Stomach cancer Father   . Breast cancer Sister   . Colon cancer Brother     Social History:  reports that he quit smoking about 50 years ago. His smoking use included Cigarettes. He started smoking about 60 years ago. He has a 10 pack-year smoking history. He has never used smokeless tobacco. He reports that he does not drink alcohol or use illicit drugs.  ROS: UROLOGY Frequent Urination?: Yes Hard to postpone urination?: No Burning/pain with urination?: No Get up at night to urinate?: Yes Leakage of urine?: No Urine stream starts and stops?: Yes Trouble starting stream?: No Do you have to strain to urinate?: No Blood in urine?: No Urinary tract infection?: No Sexually transmitted disease?: No Injury to kidneys or bladder?: No Painful intercourse?:  No Weak stream?: No Erection problems?: No Penile pain?: No  Gastrointestinal Nausea?: Yes Vomiting?: No Indigestion/heartburn?: No Diarrhea?: No Constipation?: No  Constitutional Fever: No Night sweats?: Yes Weight loss?: No Fatigue?: No  Skin Skin rash/lesions?: No Itching?: No  Eyes Blurred vision?: Yes Double vision?: No  Ears/Nose/Throat Sore throat?: No Sinus problems?: No  Hematologic/Lymphatic Swollen glands?: No  Easy bruising?: Yes  Cardiovascular Leg swelling?: Yes Chest pain?: No  Respiratory Cough?: No Shortness of breath?: Yes  Endocrine Excessive thirst?: No  Musculoskeletal Back pain?: No Joint pain?: Yes  Neurological Headaches?: No Dizziness?: No  Psychologic Depression?: Yes Anxiety?: No  Physical Exam: BP 108/63 mmHg  Pulse 76  Ht 6' (1.829 m)  Wt 256 lb 12.8 oz (116.484 kg)  BMI 34.82 kg/m2  Constitutional:  Alert and oriented, No acute distress. HEENT: [place] AT, moist mucus membranes.  Trachea midline, no masses. Cardiovascular: No clubbing, cyanosis, or edema. Respiratory: Normal respiratory effort, no increased work of breathing. GU: No CVA tenderness.  DRE: approximately 50cc prostate, smooth and nontender Skin: No rashes, bruises or suspicious lesions. Lymph: No cervical or inguinal adenopathy. Neurologic: Grossly intact, no focal deficits, moving all 4 extremities. Psychiatric: Normal mood and affect.  Laboratory Data: Lab Results  Component Value Date   WBC 10.9* 07/15/2014   HGB 15.8 07/15/2014   HCT 50.6 07/15/2014   MCV 78.3* 07/15/2014   PLT 183 07/15/2014    Lab Results  Component Value Date   CREATININE 0.79 07/19/2014    No results found for: PSA  No results found for: TESTOSTERONE  No results found for: HGBA1C  Urinalysis    Component Value Date/Time   COLORURINE Straw 07/09/2014 1745   APPEARANCEUR Clear 07/09/2014 1745   LABSPEC 1.009 07/09/2014 1745   PHURINE 6.0 07/09/2014 1745    GLUCOSEU Negative 07/09/2014 1745   HGBUR 1+ 07/09/2014 1745   BILIRUBINUR Negative 07/09/2014 1745   KETONESUR Negative 07/09/2014 1745   PROTEINUR Negative 07/09/2014 1745   NITRITE Negative 07/09/2014 1745   LEUKOCYTESUR Negative 07/09/2014 1745    Pertinent Imaging:   Assessment & Plan:    1. BPH with LUTS- PVR . Patient was unable to void adequate volume for Uroflow today. Possibly some reduction in size of prostate on DRE. Patient instructed to continue Flomax and finasteride. Questions answered concerning possible bladder outlet procedure in the future. I advised patient that he would need to discuss this further with Dr. Apolinar Junes and have her assess whether or not his prostate has reduced enough in size for him to be a good surgical candidate for TURP.  There are no diagnoses linked to this encounter.  Return for 2 months with Dr. Apolinar Junes to discuss surgery.  Earlie Lou, FNP  [facility] [address] [place], [place] [address] [phone]

## 2014-12-25 ENCOUNTER — Telehealth: Payer: Self-pay

## 2014-12-25 LAB — PSA: PROSTATE SPECIFIC AG, SERUM: 2.4 ng/mL (ref 0.0–4.0)

## 2014-12-25 NOTE — Telephone Encounter (Signed)
-----   Message from Fernanda Drum, FNP sent at 12/25/2014  8:27 AM EDT ----- Please notify patient that his PSA is 2.4. This is approximately 1/2 of his previous level.  This is a good sign that the finasteride is reducing his prostate size. He needs to keep his f/u appt as scheduled with Dr. Apolinar Junes to discuss surgical options.  thanks

## 2014-12-25 NOTE — Telephone Encounter (Signed)
Spoke with pt in reference to PSA results. Pt voiced understanding.  

## 2015-01-13 ENCOUNTER — Telehealth: Payer: Self-pay

## 2015-01-13 NOTE — Telephone Encounter (Signed)
Left a message with receptionist at Dr. Ewell Poe office. We have not received the referral for the patient to be seen here. Wanted to verify they have the correct fax as  I received a voicemail stating they have faxed it twice.

## 2015-01-16 ENCOUNTER — Other Ambulatory Visit: Payer: Self-pay | Admitting: Urology

## 2015-01-16 DIAGNOSIS — Z0000 Encounter for general adult medical examination without abnormal findings: Secondary | ICD-10-CM | POA: Insufficient documentation

## 2015-01-30 ENCOUNTER — Encounter: Payer: Self-pay | Admitting: Family

## 2015-01-30 ENCOUNTER — Ambulatory Visit: Payer: Commercial Managed Care - HMO | Attending: Family | Admitting: Family

## 2015-01-30 VITALS — BP 110/60 | HR 77 | Resp 20 | Ht 72.0 in | Wt 260.0 lb

## 2015-01-30 DIAGNOSIS — N400 Enlarged prostate without lower urinary tract symptoms: Secondary | ICD-10-CM | POA: Insufficient documentation

## 2015-01-30 DIAGNOSIS — J449 Chronic obstructive pulmonary disease, unspecified: Secondary | ICD-10-CM | POA: Diagnosis not present

## 2015-01-30 DIAGNOSIS — Z79899 Other long term (current) drug therapy: Secondary | ICD-10-CM | POA: Diagnosis not present

## 2015-01-30 DIAGNOSIS — Z87891 Personal history of nicotine dependence: Secondary | ICD-10-CM | POA: Insufficient documentation

## 2015-01-30 DIAGNOSIS — I950 Idiopathic hypotension: Secondary | ICD-10-CM

## 2015-01-30 DIAGNOSIS — I5032 Chronic diastolic (congestive) heart failure: Secondary | ICD-10-CM | POA: Diagnosis not present

## 2015-01-30 DIAGNOSIS — Z7982 Long term (current) use of aspirin: Secondary | ICD-10-CM | POA: Diagnosis not present

## 2015-01-30 DIAGNOSIS — R200 Anesthesia of skin: Secondary | ICD-10-CM | POA: Diagnosis not present

## 2015-01-30 DIAGNOSIS — E785 Hyperlipidemia, unspecified: Secondary | ICD-10-CM | POA: Insufficient documentation

## 2015-01-30 DIAGNOSIS — M069 Rheumatoid arthritis, unspecified: Secondary | ICD-10-CM | POA: Diagnosis not present

## 2015-01-30 DIAGNOSIS — I2510 Atherosclerotic heart disease of native coronary artery without angina pectoris: Secondary | ICD-10-CM | POA: Diagnosis not present

## 2015-01-30 NOTE — Patient Instructions (Signed)
Continue weighing daily and call for an overnight weight gain of > 2 pounds or a weekly weight gain of >5 pounds. 

## 2015-01-30 NOTE — Progress Notes (Signed)
Subjective:    Patient ID: Andre Hayes, male    DOB: Dec 06, 1940, 74 y.o.   MRN: [id number]  Congestive Heart Failure Presents for follow-up visit. The disease course has been stable. Associated symptoms include edema, fatigue and shortness of breath (with overexertion). Pertinent negatives include no abdominal pain, chest pain, chest pressure, orthopnea, palpitations or paroxysmal nocturnal dyspnea. The symptoms have been stable. Past treatments include salt and fluid restriction. The treatment provided moderate relief. Compliance with prior treatments has been good. His past medical history is significant for CAD and chronic lung disease. He has one 1st degree relative with heart disease. Compliance with total regimen is 76-100%.  Other This is a chronic (edema) problem. The current episode started more than 1 year ago. The problem occurs daily. The problem has been unchanged. Associated symptoms include arthralgias (right hip), congestion, fatigue and numbness (in toes). Pertinent negatives include no abdominal pain, chest pain, coughing, headaches, nausea, neck pain, sore throat or weakness. The symptoms are aggravated by walking and standing. He has tried position changes for the symptoms. The treatment provided mild relief.    Past Medical History  Diagnosis Date  . COPD (chronic obstructive pulmonary disease) (HCC)   . Hyperlipidemia   . Pneumonia 05/02/14    not hospitalized  . Rheumatoid arthritis (HCC)   . Pulmonary disease   . CAD (coronary artery disease)   . Congestive heart failure (HCC)     NYHA 3, EF>55%  . Duodenal ulcer   . BPH (benign prostatic hypertrophy)     Past Surgical History  Procedure Laterality Date  . Coronary angioplasty with stent placement    . Lung surgery      Family History  Problem Relation Age of Onset  . Tuberculosis Mother   . COPD Mother   . Emphysema Mother   . Heart disease Father   . Stomach cancer Father   . Breast cancer Sister    . Colon cancer Brother     Social History  Substance Use Topics  . Smoking status: Former Smoker -- 1.00 packs/day for 10 years    Types: Cigarettes    Start date: 06/17/1954    Quit date: 06/16/1964  . Smokeless tobacco: Never Used  . Alcohol Use: No    Allergies  Allergen Reactions  . Erythromycin Rash  . Erythromycin Base Rash    Prior to Admission medications   Medication Sig Start Date End Date Taking? Authorizing Provider  albuterol (PROVENTIL HFA;VENTOLIN HFA) 108 (90 BASE) MCG/ACT inhaler Inhale 1-2 puffs into the lungs 4 (four) times daily as needed for shortness of breath.    Yes Historical Provider, MD  albuterol (PROVENTIL) (2.5 MG/3ML) 0.083% nebulizer solution Take 2.5 mg by nebulization 2 (two) times daily.   Yes Historical Provider, MD  Albuterol Sulfate (PROAIR HFA IN) Inhale 2 puffs into the lungs 4 (four) times daily as needed.   Yes Historical Provider, MD  aspirin EC 81 MG tablet Take 81 mg by mouth daily.   Yes Historical Provider, MD  clopidogrel (PLAVIX) 75 MG tablet Take 75 mg by mouth daily.   Yes Historical Provider, MD  docusate sodium (COLACE) 100 MG capsule Take 100 mg by mouth daily.   Yes Historical Provider, MD  finasteride (PROSCAR) 5 MG tablet TAKE 1 TABLET EVERY DAY 01/16/15  Yes Shannon A McGowan, PA-C  fluticasone (FLOVENT HFA) 110 MCG/ACT inhaler Inhale 1 puff into the lungs 2 (two) times daily.   Yes Historical Provider, MD  Fluticasone Furoate-Vilanterol (BREO ELLIPTA) 100-25 MCG/INH AEPB Inhale into the lungs. 12/03/14 03/03/15 Yes Historical Provider, MD  furosemide (LASIX) 20 MG tablet Take 40 mg by mouth daily.   Yes Historical Provider, MD  gabapentin (NEURONTIN) 100 MG capsule  11/28/14  Yes Historical Provider, MD  hydroxychloroquine (PLAQUENIL) 200 MG tablet Take 200 mg by mouth every other day.   Yes Historical Provider, MD  ipratropium-albuterol (DUONEB) 0.5-2.5 (3) MG/3ML SOLN Inhale 3 mLs into the lungs every 4 (four) hours as  needed.   Yes Historical Provider, MD  isosorbide mononitrate (IMDUR) 30 MG 24 hr tablet Take 30 mg by mouth daily.   Yes Historical Provider, MD  simvastatin (ZOCOR) 40 MG tablet Take 40 mg by mouth daily.   Yes Historical Provider, MD  tamsulosin (FLOMAX) 0.4 MG CAPS capsule Take 0.8 mg by mouth daily.   Yes Historical Provider, MD  tiotropium (SPIRIVA) 18 MCG inhalation capsule Place 18 mcg into inhaler and inhale daily. 10/08/13  Yes Historical Provider, MD     Review of Systems  Constitutional: Positive for fatigue. Negative for appetite change.  HENT: Positive for congestion. Negative for postnasal drip and sore throat.   Eyes: Negative.   Respiratory: Positive for shortness of breath (with overexertion). Negative for cough and chest tightness.   Cardiovascular: Positive for leg swelling. Negative for chest pain and palpitations.  Gastrointestinal: Negative for nausea, abdominal pain and abdominal distention.  Endocrine: Negative.   Genitourinary: Positive for urgency and frequency.  Musculoskeletal: Positive for arthralgias (right hip). Negative for back pain and neck pain.  Skin: Negative.   Allergic/Immunologic: Negative.   Neurological: Positive for numbness (in toes). Negative for dizziness, weakness, light-headedness and headaches.  Hematological: Negative for adenopathy. Bruises/bleeds easily.  Psychiatric/Behavioral: Positive for sleep disturbance (due to prostate). Negative for dysphoric mood (better since becoming involved with church ministry). The patient is not nervous/anxious.        Objective:   Physical Exam  Constitutional: He is oriented to person, place, and time. He appears well-developed and well-nourished.  HENT:  Head: Normocephalic and atraumatic.  Eyes: Conjunctivae are normal. Pupils are equal, round, and reactive to light.  Neck: Normal range of motion. Neck supple.  Cardiovascular: Normal rate and regular rhythm.   Pulmonary/Chest: Effort normal. He  has no wheezes. He has no rales.  Abdominal: Soft. He exhibits no distension. There is no tenderness.  Musculoskeletal: He exhibits edema (1+ pitting edema in bilateral lower legs). He exhibits no tenderness.  Neurological: He is alert and oriented to person, place, and time.  Skin: Skin is warm and dry.  Psychiatric: He has a normal mood and affect. His behavior is normal. Thought content normal.  Nursing note and vitals reviewed.   BP 110/60 mmHg  Pulse 77  Resp 20  Ht 6' (1.829 m)  Wt 260 lb (117.935 kg)  BMI 35.25 kg/m2  SpO2 94%       Assessment & Plan:  1: Chronic heart failure with preserved ejection fraction- Patient presents with fatigue and shortness of breath when he overexerts himself. When he does get symptoms, he will stop what he's doing to rest until his symptoms improve which he says occurs pretty quickly. Does report a good appetite as he likes to cook. He doesn't add salt to many foods and does try to follow a low sodium diet. Has continued to weigh himself and does report a gradual weight gain. By our scale, he has gained 15 pounds since he was last here  in June 2016. Does have some edema in his lower legs with R being more swollen than the L. He says that they've felt tight over the last 2 months. He says that the swelling is gone when he wakes up in the mornings and he normally wears support socks daily. Does have occasional numbness/stiffness in his toes/ankles and he was encouraged to followup with his PCP regarding this as he may need a vascular referral. Did receive his flu vaccine already. 2: Hypotension- Blood pressure low upon initial check but was better after it was rechecked. Denies dizziness so will continue medications at this time. 3: COPD- Continues to use his inhalers and nebulizer as needed. Follows with pulmonologist regarding this.   Return in 3 months or sooner for any questions/problems before then.

## 2015-01-31 DIAGNOSIS — I959 Hypotension, unspecified: Secondary | ICD-10-CM | POA: Insufficient documentation

## 2015-01-31 DIAGNOSIS — J449 Chronic obstructive pulmonary disease, unspecified: Secondary | ICD-10-CM | POA: Insufficient documentation

## 2015-01-31 DIAGNOSIS — I5032 Chronic diastolic (congestive) heart failure: Secondary | ICD-10-CM | POA: Insufficient documentation

## 2015-02-25 ENCOUNTER — Encounter: Payer: Self-pay | Admitting: Urology

## 2015-02-25 ENCOUNTER — Ambulatory Visit (INDEPENDENT_AMBULATORY_CARE_PROVIDER_SITE_OTHER): Payer: Commercial Managed Care - HMO | Admitting: Urology

## 2015-02-25 VITALS — BP 127/66 | HR 80 | Ht 72.0 in | Wt 258.6 lb

## 2015-02-25 DIAGNOSIS — R351 Nocturia: Secondary | ICD-10-CM

## 2015-02-25 DIAGNOSIS — N400 Enlarged prostate without lower urinary tract symptoms: Secondary | ICD-10-CM | POA: Diagnosis not present

## 2015-02-25 NOTE — Progress Notes (Signed)
2:01 PM   02/25/2015   Andre Hayes [birth year] [id number]  Referring provider: Lauro Regulus, MD [address] [facility] [place], [place] [address]  Chief Complaint  Patient presents with  . Benign Prostatic Hypertrophy    office TRUS    HPI:  74 year old male presenting today for follow-up for BPH.  He reports that he is been compliant in taking both his Flomax 0.8 mg  and finasteride. He's been on his finasteride for greater than 6 months at this point.   Patient reports symptoms of nocturia getting up to 3-4 nightly. He states he is significantly bothered by this. He has been taking both finasteride and Flomax as instructed at previous appointments. He does have a CPAP machine and has been using it more over the past month or so. He is not sure if it helped his nighttime urinary symptoms.  He does also have trouble emptying bladder particularly at night.  His stream is also weak at times.   He is currently on ASA/ plavix for CAD/ CHF.  He does have a cardiac stent.  Dr. Laruth Bouchard is his cardiologist.    Previous Complaint History: History of BPH and fluctuating PSA and borderline elevated PVR/ incomplete bladder emptying.    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 TRUS volume 151 cc in 06/2014, started on finasteride around that time.    He does also have a history of elevated PSA- previous PSA 4.23 on 10/10/13 which is stable per review of previous [facility] records now down to 2.4 on 10/16 after starting finasteride. No previous prostate biopsy.   He also has a history of urethral stricture s/p dilation in the [employer] as a young man.     PMH: Past Medical History  Diagnosis Date  . COPD (chronic obstructive pulmonary disease) (HCC)   . Hyperlipidemia   . Pneumonia 05/02/14    not hospitalized  . Rheumatoid arthritis (HCC)   . Pulmonary disease   . CAD (coronary artery disease)   .  Congestive heart failure (HCC)     NYHA 3, EF>55%  . Duodenal ulcer   . BPH (benign prostatic hypertrophy)     Surgical History: Past Surgical History  Procedure Laterality Date  . Coronary angioplasty with stent placement    . Lung surgery      Home Medications:    Medication List       This list is accurate as of: 02/25/15  2:01 PM.  Always use your most recent med list.               albuterol 108 (90 BASE) MCG/ACT inhaler  Commonly known as:  PROVENTIL HFA;VENTOLIN HFA  Inhale 1-2 puffs into the lungs 4 (four) times daily as needed for shortness of breath.     albuterol (2.5 MG/3ML) 0.083% nebulizer solution  Commonly known as:  PROVENTIL  Take 2.5 mg by nebulization 2 (two) times daily.     aspirin EC 81 MG tablet  Take 81 mg by mouth daily.     BREO ELLIPTA 100-25 MCG/INH Aepb  Generic drug:  Fluticasone Furoate-Vilanterol  Inhale into the lungs.     clopidogrel 75 MG tablet  Commonly known as:  PLAVIX  Take 75 mg by mouth daily.     docusate sodium 100 MG capsule  Commonly known as:  COLACE  Take 100 mg by mouth daily.     finasteride 5 MG tablet  Commonly known as:  PROSCAR  TAKE 1 TABLET EVERY DAY     fluticasone 110 MCG/ACT inhaler  Commonly known as:  FLOVENT HFA  Inhale 1 puff into the lungs 2 (two) times daily.     furosemide 20 MG tablet  Commonly known as:  LASIX  Take 40 mg by mouth daily.     gabapentin 100 MG capsule  Commonly known as:  NEURONTIN     hydroxychloroquine 200 MG tablet  Commonly known as:  PLAQUENIL  Take 200 mg by mouth every other day.     isosorbide mononitrate 30 MG 24 hr tablet  Commonly known as:  IMDUR  Take 30 mg by mouth daily.     simvastatin 40 MG tablet  Commonly known as:  ZOCOR  Take 40 mg by mouth daily.     tamsulosin 0.4 MG Caps capsule  Commonly known as:  FLOMAX  Take 0.8 mg by mouth daily.     tiotropium 18 MCG inhalation capsule  Commonly known as:  SPIRIVA  Place 18 mcg into inhaler  and inhale daily.        Allergies:  Allergies  Allergen Reactions  . Erythromycin Rash  . Erythromycin Base Rash    Family History: Family History  Problem Relation Age of Onset  . Tuberculosis Mother   . COPD Mother   . Emphysema Mother   . Heart disease Father   . Stomach cancer Father   . Breast cancer Sister   . Colon cancer Brother     Social History:  reports that he quit smoking about 50 years ago. His smoking use included Cigarettes. He started smoking about 60 years ago. He has a 10 pack-year smoking history. He has never used smokeless tobacco. He reports that he does not drink alcohol or use illicit drugs.  ROS: UROLOGY Frequent Urination?: Yes Hard to postpone urination?: No Burning/pain with urination?: No Get up at night to urinate?: Yes Leakage of urine?: No Urine stream starts and stops?: Yes Trouble starting stream?: No Do you have to strain to urinate?: No Blood in urine?: No Urinary tract infection?: No Sexually transmitted disease?: No Injury to kidneys or bladder?: No Painful intercourse?: No Weak stream?: No Erection problems?: No Penile pain?: No  Gastrointestinal Nausea?: No Vomiting?: No Indigestion/heartburn?: No Diarrhea?: No Constipation?: No  Constitutional Fever: No Night sweats?: No Weight loss?: No Fatigue?: No  Skin Skin rash/lesions?: No Itching?: No  Eyes Blurred vision?: No Double vision?: No  Ears/Nose/Throat Sore throat?: No Sinus problems?: No  Hematologic/Lymphatic Swollen glands?: No Easy bruising?: Yes  Cardiovascular Leg swelling?: Yes Chest pain?: No  Respiratory Cough?: No Shortness of breath?: Yes  Endocrine Excessive thirst?: No  Musculoskeletal Back pain?: No Joint pain?: No  Neurological Headaches?: No Dizziness?: No  Psychologic Depression?: No Anxiety?: No  Physical Exam: BP 127/66 mmHg  Pulse 80  Ht 6' (1.829 m)  Wt 258 lb 9.6 oz (117.3 kg)  BMI 35.06 kg/m2    Constitutional:  Alert and oriented, No acute distress. HEENT: [place] AT, moist mucus membranes.  Trachea midline, no masses. Cardiovascular: No clubbing, cyanosis, or edema. CTAB. Respiratory: Normal respiratory effort, no increased work of breathing. RRR. GU: No CVA tenderness.  Skin: No rashes, bruises or suspicious lesions. Neurologic: Grossly intact, no focal deficits, moving all 4 extremities. Psychiatric: Normal mood and affect.  Laboratory Data: Lab Results  Component Value Date   WBC 10.9* 07/15/2014   HGB 15.8 07/15/2014   HCT 50.6 07/15/2014  MCV 78.3* 07/15/2014   PLT 183 07/15/2014    Lab Results  Component Value Date   CREATININE 0.79 07/19/2014    Lab Results  Component Value Date   PSA 2.4 12/24/2014    Pertinent Imaging: Prostate Biopsy Procedure   Informed consent was obtained .  A time out was performed to ensure correct patient identity.  Transrectal ultrasound probe was placed easily per rectum. The prostate gland was then visualized.  -Transrectal Ultrasound performed revealing a 96.7 gm prostate -No significant hypoechoic.  Mild intravesical median lobe noted  Post-Procedure: - Patient tolerated the procedure well   Assessment & Plan:  74 year old male with persistent urinary symptoms including both obstructive and irritative despite maximal medical therapy including Flomax and finasteride. There has been a significant reduction in the size of his prostate from 151 cc to 97 cc.  1. BPH (benign prostatic hyperplasia) We discussed that this point, he may benefit from a bladder at the procedure. Now that his gland is less than 100 cc, he is a candidate for a transurethral procedure. He understands that outlet procedure will help his obstructive voiding symptoms but may or may not improve his irritative symptoms and may actually worsen them. Given his history of CAD and antiplatelet medication including aspirin and Plavix, he may be best served by laser  enucleation to minimize his bleeding. We discussed alternatives including TURP vs. holmium laser enucleation of the prostate vs. greenlight laser ablation. Differences between the surgical procedures were discussed as well as the risks and benefits of each.  He is most interested in HoLEP.  We discussed the common postoperative course following holep including need for overnight Foley catheter, temporary worsening of irritative voiding symptoms, and occasional stress incontinence which typically last up to 6 months but can persist.  We discussed retrograde ejaculation and damage to surrounding structures including the urinary sphincter. Other uncommon complications including hematuria and urinary tract infection.  He understands all of the above and is willing to proceed as planned.  2. Nocturia We discussed that there is no guarantees that outlet procedure will improve his nocturia and nighttime symptoms of these are more likely to be multi fixed oriole related to his CHF, OSA, and other medical issues.  I did stress importance of continued compliance with CPAP.  Schedule HoLEP, cardiac clearance needed preop.  Surgery can be done on ASA 81 mg.  Vanna Scotland, MD  [facility] [address], [address] [place], [place] [address] [phone]

## 2015-02-27 ENCOUNTER — Telehealth: Payer: Self-pay | Admitting: Radiology

## 2015-02-27 NOTE — Telephone Encounter (Signed)
Notified pt of surgery scheduled 04/14/15, pre-admit testing appt on 04/03/15 @8 :15 and to call Friday prior to surgery for arrival time to SDS. Pt voices understanding. Surgery and Pre-Admit Testing Information Sheet mailed to pt.

## 2015-03-27 ENCOUNTER — Telehealth: Payer: Self-pay | Admitting: Radiology

## 2015-03-27 NOTE — Telephone Encounter (Signed)
Pt notified he should hold Plavix 7 days prior to surgery with last dose to be taken on 04/06/15 & hold ASA 81mg  3 days prior to surgery with last dose to be taken on  04/10/15. Reminded pt that surgery is scheduled 04/14/15 & pre-admit testing appt on 04/03/15 @8 :15. Pt voices understanding.

## 2015-04-03 ENCOUNTER — Encounter
Admission: RE | Admit: 2015-04-03 | Discharge: 2015-04-03 | Disposition: A | Discharge status: Home / Self Care | Payer: Medicare HMO | Source: Ambulatory Visit | Attending: Urology | Admitting: Urology

## 2015-04-03 DIAGNOSIS — Z01812 Encounter for preprocedural laboratory examination: Secondary | ICD-10-CM | POA: Insufficient documentation

## 2015-04-03 DIAGNOSIS — Z01810 Encounter for preprocedural cardiovascular examination: Secondary | ICD-10-CM | POA: Diagnosis present

## 2015-04-03 DIAGNOSIS — I2510 Atherosclerotic heart disease of native coronary artery without angina pectoris: Secondary | ICD-10-CM

## 2015-04-03 HISTORY — DX: Nonrheumatic aortic (valve) stenosis: I35.0

## 2015-04-03 HISTORY — DX: Unspecified age-related cataract: H25.9

## 2015-04-03 HISTORY — DX: Headache, unspecified: R51.9

## 2015-04-03 HISTORY — DX: Headache: R51

## 2015-04-03 HISTORY — DX: Essential (primary) hypertension: I10

## 2015-04-03 HISTORY — DX: Cardiac murmur, unspecified: R01.1

## 2015-04-03 HISTORY — DX: Personal history of peptic ulcer disease: Z87.11

## 2015-04-03 HISTORY — DX: Sleep apnea, unspecified: G47.30

## 2015-04-03 HISTORY — DX: Acute myocardial infarction, unspecified: I21.9

## 2015-04-03 LAB — BASIC METABOLIC PANEL
Anion gap: 8 (ref 5–15)
BUN: 20 mg/dL (ref 6–20)
CO2: 31 mmol/L (ref 22–32)
Calcium: 9.1 mg/dL (ref 8.9–10.3)
Chloride: 102 mmol/L (ref 101–111)
Creatinine, Ser: 1.03 mg/dL (ref 0.61–1.24)
GFR calc Af Amer: >60 mL/min (ref >60)
Glucose, Bld: 100 mg/dL — ABNORMAL HIGH (ref 65–99)
POTASSIUM: 4.5 mmol/L (ref 3.5–5.1)
SODIUM: 141 mmol/L (ref 135–145)

## 2015-04-03 LAB — CBC
HCT: 44.6 % (ref 40.0–52.0)
Hemoglobin: 13.9 g/dL (ref 13.0–18.0)
MCH: 24 pg — AB (ref 26.0–34.0)
MCHC: 31.3 g/dL — ABNORMAL LOW (ref 32.0–36.0)
MCV: 76.7 fL — ABNORMAL LOW (ref 80.0–100.0)
PLATELETS: 158 10*3/uL (ref 150–440)
RBC: 5.81 MIL/uL (ref 4.40–5.90)
RDW: 15.2 % — ABNORMAL HIGH (ref 11.5–14.5)
WBC: 10.1 10*3/uL (ref 3.8–10.6)

## 2015-04-03 LAB — PROTIME-INR
INR: 1.02
PROTHROMBIN TIME: 13.6 s (ref 11.4–15.0)

## 2015-04-03 NOTE — Pre-Procedure Instructions (Signed)
See Dr. Gwen Pounds notes  Normal myocardial perfusion without ischemia  Arnoldo Hooker   Result Narrative  Procedure: Pharmacologic Myocardial Perfusion Imaging   ONE day procedure  Indication: Shortness of breath - Plan: NM myocardial perfusion SPECT  multiple (stress and rest), ECG stress test only  Coronary artery disease involving native coronary artery of native heart  with other form of angina pectoris - Plan: NM myocardial perfusion SPECT  multiple (stress and rest), ECG stress test only Ordering Physician:   Dr. Arnoldo Hooker   Clinical History: 75 y.o. year old male Vitals: Height: 72 in Weight: 252 lb Cardiac risk factors include:    AS, COPD, Hyperlipidemia, HTN and CAD    Procedure:  Pharmacologic stress testing was performed with Regadenoson using a single  use 0.4mg /61ml (0.08 mg/ml) prefilled syringe intravenously infused as a  bolus dose. The stress test was stopped due to Infusion completion.  Blood  pressure response was normal.    Rest HR: 75bpm Rest BP: 124/54mmHg Max HR: 96bpm Min BP: 122/59mmHg  Stress Test Administered by: Percell Boston, CMA  ECG Interpretation: Rest ECG:  normal sinus rhythm, none Stress ECG:  normal sinus rhythm,   Recovery ECG:  normal sinus rhythm ECG Interpretation:  non-diagnostic due to pharmacologic testing.   Administrations This Visit    regadenoson (LEXISCAN) 0.4 mg/5 mL inj syringe 0.4 mg    Admin Date Action Dose Route Administered By      08/19/2014 Given 0.4 mg Intravenous Ivin Booty B Hyler, CNMT            technetium Tc65m sestamibi (CARDIOLITE) injection 13.417 millicurie    Admin Date Action Dose Route Administered By      08/19/2014 Given 13.417 millicurie Intravenous Ivin Booty B Hyler, CNMT            technetium Tc9m sestamibi (CARDIOLITE) injection 34.298 millicurie    Admin Date Action Dose Route Administered By      08/19/2014 Given 34.298 millicurie Intravenous Stacy R Nabors, CNMT                Gated post-stress perfusion imaging was performed 30 minutes after stress.  Rest images were performed 30 minutes after injection.  Gated LV Analysis:  TID Ratio: 0.96  LVEF= 64%  FINDINGS: Regional wall motion:  reveals normal myocardial thickening and wall  motion. The overall quality of the study is fair.   Artifacts noted: no Left ventricular cavity: normal.  Perfusion Analysis:  SPECT images demonstrate homogeneous tracer  distribution throughout the myocardium.     Status Results Details   Encounter Summary  Echocardiogram 2D complete6/08/2014  [facility]  Echocardiogram 2D complete6/08/2014  [facility]  Result Narrative                      CARDIOLOGY DEPARTMENT                        ORVELL, CAREAGA                        [facility]                           [id number]                   [facility]  Acct #: [id number]         [address] [place], [place] [address]             Date: 08/19/2014 11:04 AM                                                                  Adult Male Age: 75 yrs                     ECHOCARDIOGRAM REPORT                        Outpatient                                                                  KC::KCWC          STUDY:CHEST WALL         TAPE:0000:00: 0:00:00          MD1:  MARSHALL ANDERSON           ECHO:Yes   DOPPLER:Yes  FILE:0000-000-000              BP: 118/62 mmHg          COLOR:Yes  CONTRAST:No       MACHINE:Philips            HR: 83      RV BIOPSY:No         3D:No    SOUND QLTY:Moderate           Height: 72 in         MEDIUM:None                                              Weight: 252 lb                                                                  BSA: 2.4 m2  ___________________________________________________________________________________________                 HISTORY:DOE, Valvular disease                  REASON:Assess, LV  function  ___________________________________________________________________________________________ ECHOCARDIOGRAPHIC MEASUREMENTS 2D DIMENSIONS AORTA             Values      Normal Range      MAIN PA          Values      Normal Range           Annulus:  nm*       [2.3 - 2.9]  PA Main:  nm*       [1.5 - 2.1]         Aorta Sin:  nm*       [3.1 - 3.7]       RIGHT VENTRICLE       ST Junction:  nm*       [2.6 - 3.2]                RV Base:  nm*       [ < 4.2]         Asc.Aorta:  nm*       [2.6 - 3.4]                 RV Mid:  nm*       [ < 3.5]  LEFT VENTRICLE                                        RV Length:  nm*       [ < 8.6]             LVIDd:  4.5 cm    [4.2 - 5.9]       INFERIOR VENA CAVA             LVIDs:  3.0 cm                              Max. IVC:  nm*       [ <= 2.1]                FS:  32.6 %    [> 25]                    Min. IVC:  nm*               SWT:  1.4 cm    [0.6 - 1.0]                   ------------------               PWT:  1.3 cm    [0.6 - 1.0]                   nm* - not measured  LEFT ATRIUM           LA Diam:  3.0 cm    [3.0 - 4.0]       LA A4C Area:  nm*       [ < 20]         LA Volume:  nm*       [18 - 58]  ___________________________________________________________________________________________ ECHOCARDIOGRAPHIC DESCRIPTIONS  AORTIC ROOT         Size:Normal   Dissection:No dissection  AORTIC VALVE     Leaflets:Tricuspid         Morphology:Normal     Mobility:Fully mobile  LEFT VENTRICLE         Size:Normal              Anterior:Normal  Contraction:Normal               Lateral:Normal   Closest EF:>55% (Estimated)      Septal:Normal    LV Masses:No Masses             Apical:Normal  LVH:MODERATE LVH        Inferior:Normal                                 Posterior:Normal Dias.FxClass:N/A  MITRAL VALVE     Leaflets:Normal              Mobility:Fully mobile   Morphology:Normal  LEFT ATRIUM         Size:MILDLY ENLARGED    LA  Masses:No masses    IA Septum:Normal IAS  MAIN PA         Size:Normal  PULMONIC VALVE   Morphology:Normal              Mobility:Fully mobile  RIGHT VENTRICLE    RV Masses:No Masses               Size:Normal    Free Wall:Normal           Contraction:Normal  TRICUSPID VALVE     Leaflets:Normal              Mobility:Fully mobile   Morphology:Normal  RIGHT ATRIUM         Size:MILDLY ENLARGED     RA Other:None      RA Mass:No masses  PERICARDIUM        Fluid:No effusion  INFERIOR VENACAVA         Size:DILATED NO RESPIRATORY COLLAPSE   ___________________________________________________________________ DOPPLER ECHO and OTHER SPECIAL PROCEDURES    Aortic:MILD AR                       MILD AS           308.3 cm/sec peak vel         38.2 mmHg peak grad           19.7 mmHg mean grad           1.3 cm^2 by DOPPLER     Mitral:MILD MR                       No MS           117.0 cm/sec peak vel         5.5 mmHg peak grad           2.0 mmHg mean grad            1.9 cm^2 by DOPPLER           MV Inflow E Vel=74.5 cm/sec   MV Annulus E'Vel=nm*           E/E'Ratio=nm*  Tricuspid:MILD TR                       No TS           325.5 cm/sec peak TR vel      52.9 mmHg peak RV pressure  Pulmonary:MILD PR                       No PS    ___________________________________________________________________________________________  INTERPRETATION NORMAL LEFT VENTRICULAR SYSTOLIC FUNCTION NORMAL RIGHT VENTRICULAR SYSTOLIC FUNCTION MILD VALVULAR REGURGITATION (See above) MILD VALVULAR STENOSIS (See above)   ___________________________________________________________________________________________ Electronically signed by: MD Arnoldo Hooker on 08/19/2014 02:48 PM             Performed By: Dorothyann Gibbs, RDCS, RVT       Ordering  Physician: Arnoldo Hooker ___________________________________________________________________________________________   Status Results Details   Encounter  Summary

## 2015-04-03 NOTE — Pre-Procedure Instructions (Signed)
See Dr. Gwen Pounds notes from June 2016                      CARDIOLOGY DEPARTMENT                        Andre Hayes, Andre Hayes                        [facility]                           [id number]                   [facility]                       Acct #: [id number]         [address] [name], [place] [address]             Date: 08/19/2014 11:04 AM                                                                  Adult Male Age: 75 yrs                     ECHOCARDIOGRAM REPORT                        Outpatient                                                                  KC::KCWC          STUDY:CHEST WALL         TAPE:0000:00: 0:00:00          MD1:  MARSHALL ANDERSON           ECHO:Yes   DOPPLER:Yes  FILE:0000-000-000              BP: 118/62 mmHg          COLOR:Yes  CONTRAST:No       MACHINE:Philips            HR: 83      RV BIOPSY:No         3D:No    SOUND QLTY:Moderate           Height: 72 in         MEDIUM:None                                              Weight: 252 lb  BSA: 2.4 m2  ___________________________________________________________________________________________                 HISTORY:DOE, Valvular disease                  REASON:Assess, LV function  ___________________________________________________________________________________________ ECHOCARDIOGRAPHIC MEASUREMENTS 2D DIMENSIONS AORTA             Values      Normal Range      MAIN PA          Values      Normal Range           Annulus:  nm*       [2.3 - 2.9]                PA Main:  nm*       [1.5 - 2.1]         Aorta Sin:  nm*       [3.1 - 3.7]       RIGHT VENTRICLE       ST Junction:  nm*       [2.6 - 3.2]                RV Base:  nm*       [ < 4.2]         Asc.Aorta:  nm*       [2.6 - 3.4]                 RV Mid:  nm*       [ < 3.5]  LEFT VENTRICLE                                        RV Length:  nm*       [ < 8.6]  LVIDd:  4.5 cm    [4.2 - 5.9]       INFERIOR VENA CAVA             LVIDs:  3.0 cm                              Max. IVC:  nm*       [ <= 2.1]                FS:  32.6 %    [> 25]                    Min. IVC:  nm*               SWT:  1.4 cm    [0.6 - 1.0]                   ------------------               PWT:  1.3 cm    [0.6 - 1.0]                   nm* - not measured  LEFT ATRIUM           LA Diam:  3.0 cm    [3.0 - 4.0]       LA A4C Area:  nm*       [ < 20]         LA Volume:  nm*       [  18 - 58]  ___________________________________________________________________________________________ ECHOCARDIOGRAPHIC DESCRIPTIONS  AORTIC ROOT         Size:Normal   Dissection:No dissection  AORTIC VALVE     Leaflets:Tricuspid         Morphology:Normal     Mobility:Fully mobile  LEFT VENTRICLE         Size:Normal              Anterior:Normal  Contraction:Normal               Lateral:Normal   Closest EF:>55% (Estimated)      Septal:Normal    LV Masses:No Masses             Apical:Normal          EHU:DJSHFWYO LVH        Inferior:Normal                                 Posterior:Normal Dias.FxClass:N/A  MITRAL VALVE     Leaflets:Normal              Mobility:Fully mobile   Morphology:Normal  LEFT ATRIUM         Size:MILDLY ENLARGED    LA Masses:No masses    IA Septum:Normal IAS  MAIN PA         Size:Normal  PULMONIC VALVE   Morphology:Normal              Mobility:Fully mobile  RIGHT VENTRICLE    RV Masses:No Masses               Size:Normal    Free Wall:Normal           Contraction:Normal  TRICUSPID VALVE     Leaflets:Normal              Mobility:Fully mobile   Morphology:Normal  RIGHT ATRIUM         Size:MILDLY ENLARGED     RA Other:None      RA Mass:No masses  PERICARDIUM        Fluid:No effusion  INFERIOR VENACAVA         Size:DILATED NO RESPIRATORY COLLAPSE   ___________________________________________________________________ DOPPLER ECHO and OTHER SPECIAL  PROCEDURES    Aortic:MILD AR                       MILD AS           308.3 cm/sec peak vel         38.2 mmHg peak grad           19.7 mmHg mean grad           1.3 cm^2 by DOPPLER     Mitral:MILD MR                       No MS           117.0 cm/sec peak vel         5.5 mmHg peak grad           2.0 mmHg mean grad            1.9 cm^2 by DOPPLER           MV Inflow E Vel=74.5 cm/sec   MV Annulus E'Vel=nm*           E/E'Ratio=nm*  Tricuspid:MILD TR  No TS           325.5 cm/sec peak TR vel      52.9 mmHg peak RV pressure  Pulmonary:MILD PR                       No PS    ___________________________________________________________________________________________  INTERPRETATION NORMAL LEFT VENTRICULAR SYSTOLIC FUNCTION NORMAL RIGHT VENTRICULAR SYSTOLIC FUNCTION MILD VALVULAR REGURGITATION (See above) MILD VALVULAR STENOSIS (See above)   ___________________________________________________________________________________________ Electronically signed by: MD Arnoldo Hooker on 08/19/2014 02:48 PM             Performed By: Dorothyann Gibbs, RDCS, RVT       Ordering Physician: Arnoldo Hooker ___________________________________________________________________________________________   Status Results Details   Encounter Summary  ECG stress test only6/08/2014  [facility]  ECG stress test only6/08/2014  [facility]  Component Name Value Range       Status Results Details   Encounter Summary

## 2015-04-03 NOTE — Patient Instructions (Signed)
  Your procedure is scheduled on: April 14, 2015 (Monday) Report to Day Surgery.[facility]) Second Floor To find out your arrival time please call [phone] between 1PM - 3PM on April 11, 2015 (Friday).  Remember: Instructions that are not followed completely may result in serious medical risk, up to and including death, or upon the discretion of your surgeon and anesthesiologist your surgery may need to be rescheduled.    __x__ 1. Do not eat food or drink liquids after midnight. No gum chewing or hard candies.     __x__ 2. No Alcohol for 24 hours before or after surgery.   ____ 3. Bring all medications with you on the day of surgery if instructed.    __x__ 4. Notify your doctor if there is any change in your medical condition     (cold, fever, infections).     Do not wear jewelry, make-up, hairpins, clips or nail polish.  Do not wear lotions, powders, or perfumes. You may wear deodorant.  Do not shave 48 hours prior to surgery. Men may shave face and neck.  Do not bring valuables to the hospital.    [facility] is not responsible for any belongings or valuables.               Contacts, dentures or bridgework may not be worn into surgery.  Leave your suitcase in the car. After surgery it may be brought to your room.  For patients admitted to the hospital, discharge time is determined by your                treatment team.   Patients discharged the day of surgery will not be allowed to drive home.   Please read over the following fact sheets that you were given:   Surgical Site Infection Prevention   ____ Take these medicines the morning of surgery with A SIP OF WATER:    1. Isosorbide  2. Gabapentin  3. Mag-Oxide  4.Simvastatin  5.  6.  ____ Fleet Enema (as directed)   ____ Use CHG Soap as directed  __x__ Use inhalers on the day of surgery (Use Albuterol Nebulizer, Flovent, and Spiriva inhalers the morning of surgery, and bring Albuterol inhaler with you to the  hospital the day of surgery.  ____ Stop metformin 2 days prior to surgery    ____ Take 1/2 of usual insulin dose the night before surgery and none on the morning of surgery.   _x___ Stop Coumadin/Plavix/aspirin on (STOP PLAVIX SEVEN DAYS PRIOR TO SURGERY AND STOP ASPIRIN THREE DAYS PRIOR TO SURGERY)  _x___ Stop Anti-inflammatories on (STOP ALEVE ONE WEEK PRIOR TO SURGERY) TYLENOL OK TO TAKE FOR PAIN IF NEEDED   _x___ Stop supplements until after surgery.  (STOP VITAMIN B-12 NOW)  _x___ Bring C-Pap to the hospital.

## 2015-04-05 LAB — URINE CULTURE: Culture: NO GROWTH

## 2015-04-07 NOTE — Pre-Procedure Instructions (Signed)
Ekg faxed to DR Gwen Pounds who cleared patient 03/19/15

## 2015-04-11 NOTE — OR Nursing (Signed)
Spoke with office about outdated H&P. Dr Apolinar Junes will update morning of surgery.

## 2015-04-14 ENCOUNTER — Encounter
Admission: RE | Disposition: A | Discharge status: Home / Self Care | Payer: Self-pay | Source: Ambulatory Visit | Attending: Urology

## 2015-04-14 ENCOUNTER — Ambulatory Visit: Payer: Medicare HMO | Admitting: Anesthesiology

## 2015-04-14 ENCOUNTER — Encounter: Payer: Self-pay | Admitting: *Deleted

## 2015-04-14 ENCOUNTER — Observation Stay
Admission: RE | Admit: 2015-04-14 | Discharge: 2015-04-15 | Disposition: A | Discharge status: Home / Self Care | Payer: Medicare HMO | Source: Ambulatory Visit | Attending: Urology | Admitting: Urology

## 2015-04-14 DIAGNOSIS — R351 Nocturia: Secondary | ICD-10-CM | POA: Insufficient documentation

## 2015-04-14 DIAGNOSIS — Z8711 Personal history of peptic ulcer disease: Secondary | ICD-10-CM | POA: Diagnosis not present

## 2015-04-14 DIAGNOSIS — M069 Rheumatoid arthritis, unspecified: Secondary | ICD-10-CM | POA: Insufficient documentation

## 2015-04-14 DIAGNOSIS — R339 Retention of urine, unspecified: Secondary | ICD-10-CM | POA: Insufficient documentation

## 2015-04-14 DIAGNOSIS — Z7902 Long term (current) use of antithrombotics/antiplatelets: Secondary | ICD-10-CM | POA: Insufficient documentation

## 2015-04-14 DIAGNOSIS — N401 Enlarged prostate with lower urinary tract symptoms: Principal | ICD-10-CM | POA: Insufficient documentation

## 2015-04-14 DIAGNOSIS — J449 Chronic obstructive pulmonary disease, unspecified: Secondary | ICD-10-CM | POA: Diagnosis not present

## 2015-04-14 DIAGNOSIS — Z7982 Long term (current) use of aspirin: Secondary | ICD-10-CM | POA: Diagnosis not present

## 2015-04-14 DIAGNOSIS — N320 Bladder-neck obstruction: Secondary | ICD-10-CM | POA: Insufficient documentation

## 2015-04-14 DIAGNOSIS — E785 Hyperlipidemia, unspecified: Secondary | ICD-10-CM | POA: Diagnosis not present

## 2015-04-14 DIAGNOSIS — Z7951 Long term (current) use of inhaled steroids: Secondary | ICD-10-CM | POA: Diagnosis not present

## 2015-04-14 DIAGNOSIS — N400 Enlarged prostate without lower urinary tract symptoms: Secondary | ICD-10-CM | POA: Diagnosis present

## 2015-04-14 DIAGNOSIS — Z87891 Personal history of nicotine dependence: Secondary | ICD-10-CM | POA: Insufficient documentation

## 2015-04-14 DIAGNOSIS — Z955 Presence of coronary angioplasty implant and graft: Secondary | ICD-10-CM | POA: Diagnosis not present

## 2015-04-14 DIAGNOSIS — G4733 Obstructive sleep apnea (adult) (pediatric): Secondary | ICD-10-CM | POA: Insufficient documentation

## 2015-04-14 DIAGNOSIS — I509 Heart failure, unspecified: Secondary | ICD-10-CM | POA: Insufficient documentation

## 2015-04-14 DIAGNOSIS — Z79899 Other long term (current) drug therapy: Secondary | ICD-10-CM | POA: Diagnosis not present

## 2015-04-14 DIAGNOSIS — N138 Other obstructive and reflux uropathy: Secondary | ICD-10-CM | POA: Diagnosis not present

## 2015-04-14 DIAGNOSIS — I2510 Atherosclerotic heart disease of native coronary artery without angina pectoris: Secondary | ICD-10-CM | POA: Insufficient documentation

## 2015-04-14 HISTORY — PX: HOLEP-LASER ENUCLEATION OF THE PROSTATE WITH MORCELLATION: SHX6641

## 2015-04-14 SURGERY — ENUCLEATION, PROSTATE, USING LASER, WITH MORCELLATION
Anesthesia: General

## 2015-04-14 MED ORDER — ONDANSETRON HCL 4 MG/2ML IJ SOLN
4.0000 mg | Freq: Once | INTRAMUSCULAR | Status: AC
  Administered 2015-04-14: 4 mg via INTRAVENOUS

## 2015-04-14 MED ORDER — FENTANYL CITRATE (PF) 100 MCG/2ML IJ SOLN
INTRAMUSCULAR | Status: DC | PRN
  Administered 2015-04-14: 25 ug via INTRAVENOUS
  Administered 2015-04-14: 100 ug via INTRAVENOUS

## 2015-04-14 MED ORDER — ROCURONIUM BROMIDE 100 MG/10ML IV SOLN
INTRAVENOUS | Status: DC | PRN
  Administered 2015-04-14: 40 mg via INTRAVENOUS
  Administered 2015-04-14: 10 mg via INTRAVENOUS

## 2015-04-14 MED ORDER — TAMSULOSIN HCL 0.4 MG PO CAPS
0.8000 mg | ORAL_CAPSULE | Freq: Every day | ORAL | Status: DC
  Administered 2015-04-14 – 2015-04-15 (×2): 0.8 mg via ORAL
  Filled 2015-04-14 (×2): qty 2

## 2015-04-14 MED ORDER — FUROSEMIDE 40 MG PO TABS
40.0000 mg | ORAL_TABLET | Freq: Every day | ORAL | Status: DC
  Administered 2015-04-14 – 2015-04-15 (×2): 40 mg via ORAL
  Filled 2015-04-14 (×2): qty 1

## 2015-04-14 MED ORDER — DIPHENHYDRAMINE HCL 12.5 MG/5ML PO ELIX
12.5000 mg | ORAL_SOLUTION | Freq: Four times a day (QID) | ORAL | Status: DC | PRN
  Filled 2015-04-14: qty 5

## 2015-04-14 MED ORDER — DEXAMETHASONE SODIUM PHOSPHATE 10 MG/ML IJ SOLN
INTRAMUSCULAR | Status: DC | PRN
  Administered 2015-04-14: 5 mg via INTRAVENOUS

## 2015-04-14 MED ORDER — NEOSTIGMINE METHYLSULFATE 10 MG/10ML IV SOLN
INTRAVENOUS | Status: DC | PRN
  Administered 2015-04-14: 1 mg via INTRAVENOUS

## 2015-04-14 MED ORDER — PSYLLIUM 0.52 G PO CAPS: 0.5200 g | ORAL_CAPSULE | Freq: Every day | ORAL | Status: DC

## 2015-04-14 MED ORDER — DIPHENHYDRAMINE HCL 50 MG/ML IJ SOLN: 12.5000 mg | Freq: Four times a day (QID) | INTRAMUSCULAR | Status: DC | PRN

## 2015-04-14 MED ORDER — FAMOTIDINE 20 MG PO TABS
ORAL_TABLET | ORAL | Status: AC
  Administered 2015-04-14: 20 mg via ORAL
  Filled 2015-04-14: qty 1

## 2015-04-14 MED ORDER — DOCUSATE SODIUM 100 MG PO CAPS
100.0000 mg | ORAL_CAPSULE | Freq: Every day | ORAL | Status: DC
  Administered 2015-04-14 – 2015-04-15 (×2): 100 mg via ORAL
  Filled 2015-04-14 (×2): qty 1

## 2015-04-14 MED ORDER — PSYLLIUM 95 % PO PACK
1.0000 | PACK | Freq: Every day | ORAL | Status: DC
  Filled 2015-04-14 (×2): qty 1

## 2015-04-14 MED ORDER — BELLADONNA ALKALOIDS-OPIUM 16.2-60 MG RE SUPP
RECTAL | Status: AC
  Filled 2015-04-14: qty 1

## 2015-04-14 MED ORDER — FOLIC ACID 400 MCG PO TABS: 400.0000 ug | ORAL_TABLET | Freq: Every day | ORAL | Status: DC

## 2015-04-14 MED ORDER — HYDROXYCHLOROQUINE SULFATE 200 MG PO TABS
200.0000 mg | ORAL_TABLET | Freq: Every day | ORAL | Status: DC
  Administered 2015-04-15: 200 mg via ORAL
  Filled 2015-04-14: qty 1

## 2015-04-14 MED ORDER — MAGNESIUM OXIDE 400 (241.3 MG) MG PO TABS
400.0000 mg | ORAL_TABLET | Freq: Every day | ORAL | Status: DC
  Administered 2015-04-15: 400 mg via ORAL
  Filled 2015-04-14 (×3): qty 1

## 2015-04-14 MED ORDER — OXYBUTYNIN CHLORIDE 5 MG PO TABS: 5.0000 mg | ORAL_TABLET | Freq: Three times a day (TID) | ORAL | Status: DC | PRN

## 2015-04-14 MED ORDER — PROPOFOL 10 MG/ML IV BOLUS
INTRAVENOUS | Status: DC | PRN
  Administered 2015-04-14: 30 mg via INTRAVENOUS
  Administered 2015-04-14: 100 mg via INTRAVENOUS

## 2015-04-14 MED ORDER — SIMVASTATIN 40 MG PO TABS
40.0000 mg | ORAL_TABLET | Freq: Every day | ORAL | Status: DC
  Administered 2015-04-15: 40 mg via ORAL
  Filled 2015-04-14: qty 1

## 2015-04-14 MED ORDER — BELLADONNA ALKALOIDS-OPIUM 16.2-60 MG RE SUPP
1.0000 | Freq: Every day | RECTAL | Status: DC
  Administered 2015-04-14: 1 via RECTAL

## 2015-04-14 MED ORDER — ASPIRIN EC 81 MG PO TBEC
81.0000 mg | DELAYED_RELEASE_TABLET | Freq: Every day | ORAL | Status: DC
  Administered 2015-04-15: 81 mg via ORAL
  Filled 2015-04-14: qty 1

## 2015-04-14 MED ORDER — BUDESONIDE 0.5 MG/2ML IN SUSP
0.5000 mg | Freq: Two times a day (BID) | RESPIRATORY_TRACT | Status: DC
  Administered 2015-04-14 – 2015-04-15 (×2): 0.5 mg via RESPIRATORY_TRACT
  Filled 2015-04-14 (×2): qty 2

## 2015-04-14 MED ORDER — BELLADONNA ALKALOIDS-OPIUM 16.2-60 MG RE SUPP: 1.0000 | Freq: Four times a day (QID) | RECTAL | Status: DC | PRN

## 2015-04-14 MED ORDER — MORPHINE SULFATE (PF) 2 MG/ML IV SOLN: 2.0000 mg | INTRAVENOUS | Status: DC | PRN

## 2015-04-14 MED ORDER — LIDOCAINE HCL (CARDIAC) 20 MG/ML IV SOLN
INTRAVENOUS | Status: DC | PRN
  Administered 2015-04-14: 100 mg via INTRAVENOUS

## 2015-04-14 MED ORDER — LACTATED RINGERS IV SOLN
INTRAVENOUS | Status: DC
  Administered 2015-04-14 (×3): via INTRAVENOUS

## 2015-04-14 MED ORDER — SODIUM CHLORIDE 0.9 % IV SOLN
INTRAVENOUS | Status: DC
  Administered 2015-04-14 – 2015-04-15 (×2): via INTRAVENOUS

## 2015-04-14 MED ORDER — ONDANSETRON HCL 4 MG/2ML IJ SOLN
INTRAMUSCULAR | Status: AC
  Administered 2015-04-14: 4 mg via INTRAVENOUS
  Filled 2015-04-14: qty 2

## 2015-04-14 MED ORDER — IPRATROPIUM-ALBUTEROL 0.5-2.5 (3) MG/3ML IN SOLN: 3.0000 mL | Freq: Once | RESPIRATORY_TRACT | Status: DC | PRN

## 2015-04-14 MED ORDER — ALBUTEROL SULFATE (2.5 MG/3ML) 0.083% IN NEBU: 2.5000 mg | INHALATION_SOLUTION | Freq: Four times a day (QID) | RESPIRATORY_TRACT | Status: DC | PRN

## 2015-04-14 MED ORDER — OXYCODONE-ACETAMINOPHEN 5-325 MG PO TABS: 1.0000 | ORAL_TABLET | ORAL | Status: DC | PRN

## 2015-04-14 MED ORDER — FOLIC ACID 1 MG PO TABS
1.0000 mg | ORAL_TABLET | Freq: Every day | ORAL | Status: DC
  Administered 2015-04-15: 1 mg via ORAL
  Filled 2015-04-14: qty 1

## 2015-04-14 MED ORDER — OXYCODONE HCL 5 MG/5ML PO SOLN: 5.0000 mg | Freq: Once | ORAL | Status: DC | PRN

## 2015-04-14 MED ORDER — TIOTROPIUM BROMIDE MONOHYDRATE 18 MCG IN CAPS
18.0000 ug | ORAL_CAPSULE | Freq: Every day | RESPIRATORY_TRACT | Status: DC
  Filled 2015-04-14: qty 5

## 2015-04-14 MED ORDER — ALBUTEROL SULFATE (2.5 MG/3ML) 0.083% IN NEBU
2.5000 mg | INHALATION_SOLUTION | Freq: Two times a day (BID) | RESPIRATORY_TRACT | Status: DC
Start: 2015-04-14 — End: 2015-04-14

## 2015-04-14 MED ORDER — PHENYLEPHRINE HCL 10 MG/ML IJ SOLN
INTRAMUSCULAR | Status: DC | PRN
  Administered 2015-04-14 (×2): 200 ug via INTRAVENOUS
  Administered 2015-04-14 (×3): 100 ug via INTRAVENOUS
  Administered 2015-04-14: 200 ug via INTRAVENOUS

## 2015-04-14 MED ORDER — ONDANSETRON HCL 4 MG/2ML IJ SOLN: 4.0000 mg | INTRAMUSCULAR | Status: DC | PRN

## 2015-04-14 MED ORDER — GLYCOPYRROLATE 0.2 MG/ML IJ SOLN
INTRAMUSCULAR | Status: DC | PRN
  Administered 2015-04-14: 0.2 mg via INTRAVENOUS

## 2015-04-14 MED ORDER — METOPROLOL TARTRATE 1 MG/ML IV SOLN
INTRAVENOUS | Status: DC | PRN
  Administered 2015-04-14: 1 mg via INTRAVENOUS

## 2015-04-14 MED ORDER — FAMOTIDINE 20 MG PO TABS
20.0000 mg | ORAL_TABLET | Freq: Once | ORAL | Status: AC
  Administered 2015-04-14: 20 mg via ORAL

## 2015-04-14 MED ORDER — ISOSORBIDE MONONITRATE ER 30 MG PO TB24
30.0000 mg | ORAL_TABLET | Freq: Every day | ORAL | Status: DC
  Administered 2015-04-15: 30 mg via ORAL
  Filled 2015-04-14: qty 1

## 2015-04-14 MED ORDER — ACETAMINOPHEN 325 MG PO TABS
650.0000 mg | ORAL_TABLET | ORAL | Status: DC | PRN
  Administered 2015-04-15: 650 mg via ORAL
  Filled 2015-04-14: qty 2

## 2015-04-14 MED ORDER — PHENYLEPHRINE HCL 10 MG/ML IJ SOLN: 10.0000 mg | INTRAVENOUS | Status: DC | PRN

## 2015-04-14 MED ORDER — OXYCODONE HCL 5 MG PO TABS: 5.0000 mg | ORAL_TABLET | Freq: Once | ORAL | Status: DC | PRN

## 2015-04-14 MED ORDER — ONDANSETRON HCL 4 MG/2ML IJ SOLN
INTRAMUSCULAR | Status: DC | PRN
  Administered 2015-04-14: 4 mg via INTRAVENOUS

## 2015-04-14 MED ORDER — GABAPENTIN 100 MG PO CAPS
200.0000 mg | ORAL_CAPSULE | Freq: Every day | ORAL | Status: DC
  Administered 2015-04-14 – 2015-04-15 (×2): 200 mg via ORAL
  Filled 2015-04-14 (×3): qty 2

## 2015-04-14 MED ORDER — ALBUTEROL SULFATE HFA 108 (90 BASE) MCG/ACT IN AERS: 1.0000 | INHALATION_SPRAY | Freq: Four times a day (QID) | RESPIRATORY_TRACT | Status: DC | PRN

## 2015-04-14 MED ORDER — ALBUTEROL SULFATE (2.5 MG/3ML) 0.083% IN NEBU
2.5000 mg | INHALATION_SOLUTION | Freq: Two times a day (BID) | RESPIRATORY_TRACT | Status: DC
  Administered 2015-04-14 – 2015-04-15 (×2): 2.5 mg via RESPIRATORY_TRACT
  Filled 2015-04-14 (×2): qty 3

## 2015-04-14 MED ORDER — PHENYLEPHRINE HCL 10 MG/ML IJ SOLN
10000.0000 ug | INTRAMUSCULAR | Status: DC | PRN
  Administered 2015-04-14: 60 ug/min via INTRAVENOUS

## 2015-04-14 MED ORDER — DEXTROSE 5 % IV SOLN
3.0000 g | Freq: Once | INTRAVENOUS | Status: AC
  Administered 2015-04-14: 3 g via INTRAVENOUS
  Filled 2015-04-14: qty 3000

## 2015-04-14 MED ORDER — FENTANYL CITRATE (PF) 100 MCG/2ML IJ SOLN
25.0000 ug | INTRAMUSCULAR | Status: DC | PRN
  Administered 2015-04-14 (×3): 25 ug via INTRAVENOUS

## 2015-04-14 MED ORDER — FENTANYL CITRATE (PF) 100 MCG/2ML IJ SOLN
INTRAMUSCULAR | Status: AC
  Administered 2015-04-14: 25 ug via INTRAVENOUS
  Filled 2015-04-14: qty 2

## 2015-04-14 MED ORDER — FUROSEMIDE 10 MG/ML IJ SOLN
INTRAMUSCULAR | Status: DC | PRN
  Administered 2015-04-14: 10 mg via INTRAMUSCULAR

## 2015-04-14 SURGICAL SUPPLY — 46 items
ADAPTER IRRIG TUBE 2 SPIKE SOL (ADAPTER) ×6 IMPLANT
BAG URO DRAIN 2000ML W/SPOUT (MISCELLANEOUS) ×3 IMPLANT
BAG URO DRAIN 4000ML (MISCELLANEOUS) ×6 IMPLANT
CANISTER SUC SOCK COL 7IN (MISCELLANEOUS) ×3 IMPLANT
CATH FOL 2WAY LX 20X30 (CATHETERS) ×6 IMPLANT
CATH FOL LEG HOLDER (MISCELLANEOUS) ×3 IMPLANT
CATH FOLEY 3WAY 30CC 22FR (CATHETERS) ×3 IMPLANT
CATH URETL 5X70 OPEN END (CATHETERS) ×3 IMPLANT
CNTNR SPEC 2.5X3XGRAD LEK (MISCELLANEOUS) ×1
CONT SPEC 4OZ STER OR WHT (MISCELLANEOUS) ×2
CONTAINER COLLECT MORCELLATR (MISCELLANEOUS) ×1 IMPLANT
CONTAINER SPEC 2.5X3XGRAD LEK (MISCELLANEOUS) ×1 IMPLANT
DRAPE SHEET LG 3/4 BI-LAMINATE (DRAPES) ×3 IMPLANT
DRAPE UTILITY 15X26 TOWEL STRL (DRAPES) ×3 IMPLANT
ELECT REM PT RETURN 9FT ADLT (ELECTROSURGICAL)
ELECTRODE REM PT RTRN 9FT ADLT (ELECTROSURGICAL) IMPLANT
FILTER OVERFLOW MORCELLATOR (FILTER) ×1 IMPLANT
GLOVE BIO SURGEON STRL SZ 6.5 (GLOVE) ×2 IMPLANT
GLOVE BIO SURGEON STRL SZ7 (GLOVE) ×6 IMPLANT
GLOVE BIO SURGEONS STRL SZ 6.5 (GLOVE) ×1
GOWN STRL REUS W/ TWL LRG LVL3 (GOWN DISPOSABLE) ×2 IMPLANT
GOWN STRL REUS W/TWL LRG LVL3 (GOWN DISPOSABLE) ×4
IV NS 1000ML (IV SOLUTION) ×22
IV NS 1000ML BAXH (IV SOLUTION) ×11 IMPLANT
KIT RM TURNOVER CYSTO AR (KITS) ×3 IMPLANT
LASER FIBER 550M SMARTSCOPE (Laser) ×3 IMPLANT
LASER HOLMIUM HI POWER (MISCELLANEOUS) ×2
LASER HOLMIUM PROCEDURE AGIL (MISCELLANEOUS) ×1 IMPLANT
MORCELLATOR COLLECT CONTAINER (MISCELLANEOUS) ×2
MORCELLATOR OVERFLOW FILTER (FILTER) ×2
MORCELLATOR ROTATION 4.75 335 (MISCELLANEOUS) ×2 IMPLANT
PACK CYSTO AR (MISCELLANEOUS) ×3 IMPLANT
PIRANHA TUBING SET ×2 IMPLANT
PREP PVP WINGED SPONGE (MISCELLANEOUS) ×3 IMPLANT
PUMP SINGLE ACTION SAP (PUMP) ×3 IMPLANT
SENSORWIRE 0.038 NOT ANGLED (WIRE)
SET IRRIG Y TYPE TUR BLADDER L (SET/KITS/TRAYS/PACK) ×3 IMPLANT
SOL .9 NS 3000ML IRR  AL (IV SOLUTION) ×26
SOL .9 NS 3000ML IRR UROMATIC (IV SOLUTION) ×13 IMPLANT
SYRINGE IRR TOOMEY STRL 70CC (SYRINGE) ×3 IMPLANT
TUBE PUMP MORCELLATOR PIRANHA (TUBING) ×2 IMPLANT
TUBING CONNECTING 10 (TUBING) ×2 IMPLANT
TUBING CONNECTING 10' (TUBING) ×1
WATER STERILE IRR 1000ML POUR (IV SOLUTION) ×3 IMPLANT
WIRE SENSOR 0.038 NOT ANGLED (WIRE) IMPLANT
WOLF MORCELLATOR ×3 IMPLANT

## 2015-04-14 NOTE — Anesthesia Preprocedure Evaluation (Addendum)
Anesthesia Evaluation  Patient identified by MRN, date of birth, ID band Patient awake    Reviewed: Allergy & Precautions, H&P , NPO status , Patient's Chart, lab work & pertinent test results  History of Anesthesia Complications Negative for: history of anesthetic complications  Airway Mallampati: III  TM Distance: >3 FB Neck ROM: limited    Dental  (+) Poor Dentition, Chipped, Missing, Upper Dentures   Pulmonary neg shortness of breath, sleep apnea and Continuous Positive Airway Pressure Ventilation , pneumonia, resolved, COPD, former smoker,    Pulmonary exam normal breath sounds clear to auscultation       Cardiovascular Exercise Tolerance: Good hypertension, (-) angina+ CAD, + Past MI, + Cardiac Stents, +CHF and + DOE  + Valvular Problems/Murmurs AS  Rhythm:regular Rate:Normal + Systolic murmurs    Neuro/Psych  Headaches, negative psych ROS   GI/Hepatic Neg liver ROS, PUD, neg GERD  ,  Endo/Other  negative endocrine ROS  Renal/GU negative Renal ROS  negative genitourinary   Musculoskeletal  (+) Arthritis ,   Abdominal   Peds  Hematology negative hematology ROS (+)   Anesthesia Other Findings Past Medical History:   COPD (chronic obstructive pulmonary disease) (*              Hyperlipidemia                                               Pneumonia                                       05/02/14        Comment:not hospitalized   Rheumatoid arthritis (HCC)                                   Pulmonary disease                                            CAD (coronary artery disease)                                Congestive heart failure (HCC)                                 Comment:NYHA 3, EF>55%   Duodenal ulcer                                               BPH (benign prostatic hypertrophy)                           Hypertension  Heart murmur                                                  Myocardial infarction [facility])                                  Sleep apnea                                                  Aortic stenosis                                              Headache                                                     History of peptic ulcer disease                              Senile cataracts of both eyes                               Past Surgical History:   LUNG SURGERY                                                  CORONARY ANGIOPLASTY WITH STENT PLACEMENT                       Comment:Dr. Callwood  BMI    Body Mass Index   35.36 kg/m 2    Patient has cardiac clearance for this procedure.     Reproductive/Obstetrics negative OB ROS                           Anesthesia Physical Anesthesia Plan  ASA: IV  Anesthesia Plan: General ETT   Post-op Pain Management:    Induction:   Airway Management Planned:   Additional Equipment:   Intra-op Plan:   Post-operative Plan:   Informed Consent: I have reviewed the patients History and Physical, chart, labs and discussed the procedure including the risks, benefits and alternatives for the proposed anesthesia with the patient or authorized representative who has indicated his/her understanding and acceptance.   Dental Advisory Given  Plan Discussed with: Anesthesiologist, CRNA and Surgeon  Anesthesia Plan Comments: (Patient informed that they are higher risk for complications from anesthesia during this procedure due to their medical history.  Patient voiced understanding. )       Anesthesia Quick Evaluation

## 2015-04-14 NOTE — H&P (Signed)
2:01 PM  02/25/2015   Andre Hayes [birth year] [id number]  Referring provider: Lauro Regulus, MD [address] [facility] [place], [place] [address]  Chief Complaint  Patient presents with  . Benign Prostatic Hypertrophy    office TRUS    HPI: 75 year old male presenting today for follow-up for BPH. He reports that he is been compliant in taking both his Flomax 0.8 mg and finasteride. He's been on his finasteride for greater than 6 months at this point.  Patient reports symptoms of nocturia getting up to 3-4 nightly. He states he is significantly bothered by this. He has been taking both finasteride and Flomax as instructed at previous appointments. He does have a CPAP machine and has been using it more over the past month or so. He is not sure if it helped his nighttime urinary symptoms.  He does also have trouble emptying bladder particularly at night. His stream is also weak at times.   He is currently on ASA/ plavix for CAD/ CHF. He does have a cardiac stent. Dr. Laruth Bouchard is his cardiologist.   Previous Complaint History: History of BPH and fluctuating PSA and borderline elevated PVR/ incomplete bladder emptying.   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 TRUS volume 151 cc in 06/2014, started on finasteride around that time.   He does also have a history of elevated PSA- previous PSA 4.23 on 10/10/13 which is stable per review of previous [facility] records now down to 2.4 on 10/16 after starting finasteride. No previous prostate biopsy.   He also has a history of urethral stricture s/p dilation in the [employer] as a young man.     PMH: Past Medical History  Diagnosis Date  . COPD (chronic obstructive pulmonary disease) (HCC)   . Hyperlipidemia   . Pneumonia 05/02/14    not hospitalized  . Rheumatoid arthritis (HCC)   . Pulmonary disease   . CAD  (coronary artery disease)   . Congestive heart failure (HCC)     NYHA 3, EF>55%  . Duodenal ulcer   . BPH (benign prostatic hypertrophy)     Surgical History: Past Surgical History  Procedure Laterality Date  . Coronary angioplasty with stent placement    . Lung surgery      Home Medications:    Medication List       This list is accurate as of: 02/25/15 2:01 PM. Always use your most recent med list.              albuterol 108 (90 BASE) MCG/ACT inhaler  Commonly known as: PROVENTIL HFA;VENTOLIN HFA  Inhale 1-2 puffs into the lungs 4 (four) times daily as needed for shortness of breath.     albuterol (2.5 MG/3ML) 0.083% nebulizer solution  Commonly known as: PROVENTIL  Take 2.5 mg by nebulization 2 (two) times daily.     aspirin EC 81 MG tablet  Take 81 mg by mouth daily.     BREO ELLIPTA 100-25 MCG/INH Aepb  Generic drug: Fluticasone Furoate-Vilanterol  Inhale into the lungs.     clopidogrel 75 MG tablet  Commonly known as: PLAVIX  Take 75 mg by mouth daily.     docusate sodium 100 MG capsule  Commonly known as: COLACE  Take 100 mg by mouth daily.     finasteride 5 MG tablet  Commonly known as: PROSCAR  TAKE 1 TABLET EVERY DAY     fluticasone 110 MCG/ACT inhaler  Commonly known as: FLOVENT HFA  Inhale 1 puff into the lungs 2 (two) times daily.     furosemide 20 MG tablet  Commonly known as: LASIX  Take 40 mg by mouth daily.     gabapentin 100 MG capsule  Commonly known as: NEURONTIN     hydroxychloroquine 200 MG tablet  Commonly known as: PLAQUENIL  Take 200 mg by mouth every other day.     isosorbide mononitrate 30 MG 24 hr tablet  Commonly known as: IMDUR  Take 30 mg by mouth daily.     simvastatin 40 MG tablet  Commonly known as: ZOCOR  Take 40 mg by mouth daily.     tamsulosin 0.4 MG Caps capsule  Commonly known as:  FLOMAX  Take 0.8 mg by mouth daily.     tiotropium 18 MCG inhalation capsule  Commonly known as: SPIRIVA  Place 18 mcg into inhaler and inhale daily.        Allergies:  Allergies  Allergen Reactions  . Erythromycin Rash  . Erythromycin Base Rash    Family History: Family History  Problem Relation Age of Onset  . Tuberculosis Mother   . COPD Mother   . Emphysema Mother   . Heart disease Father   . Stomach cancer Father   . Breast cancer Sister   . Colon cancer Brother     Social History:  reports that he quit smoking about 50 years ago. His smoking use included Cigarettes. He started smoking about 60 years ago. He has a 10 pack-year smoking history. He has never used smokeless tobacco. He reports that he does not drink alcohol or use illicit drugs.  ROS: UROLOGY Frequent Urination?: Yes Hard to postpone urination?: No Burning/pain with urination?: No Get up at night to urinate?: Yes Leakage of urine?: No Urine stream starts and stops?: Yes Trouble starting stream?: No Do you have to strain to urinate?: No Blood in urine?: No Urinary tract infection?: No Sexually transmitted disease?: No Injury to kidneys or bladder?: No Painful intercourse?: No Weak stream?: No Erection problems?: No Penile pain?: No  Gastrointestinal Nausea?: No Vomiting?: No Indigestion/heartburn?: No Diarrhea?: No Constipation?: No  Constitutional Fever: No Night sweats?: No Weight loss?: No Fatigue?: No  Skin Skin rash/lesions?: No Itching?: No  Eyes Blurred vision?: No Double vision?: No  Ears/Nose/Throat Sore throat?: No Sinus problems?: No  Hematologic/Lymphatic Swollen glands?: No Easy bruising?: Yes  Cardiovascular Leg swelling?: Yes Chest pain?: No  Respiratory Cough?: No Shortness of breath?: Yes  Endocrine Excessive thirst?: No  Musculoskeletal Back pain?: No Joint pain?:  No  Neurological Headaches?: No Dizziness?: No  Psychologic Depression?: No Anxiety?: No  Physical Exam: BP 127/66 mmHg  Pulse 80  Ht 6' (1.829 m)  Wt 258 lb 9.6 oz (117.3 kg)  BMI 35.06 kg/m2  Constitutional: Alert and oriented, No acute distress. HEENT: [place] AT, moist mucus membranes. Trachea midline, no masses. Cardiovascular: No clubbing, cyanosis, or edema. CTAB. Respiratory: Normal respiratory effort, no increased work of breathing. RRR. GU: No CVA tenderness.  Skin: No rashes, bruises or suspicious lesions. Neurologic: Grossly intact, no focal deficits, moving all 4 extremities. Psychiatric: Normal mood and affect.  Laboratory Data:  Recent Labs    Lab Results  Component Value Date   WBC 10.9* 07/15/2014   HGB 15.8 07/15/2014   HCT 50.6 07/15/2014   MCV 78.3* 07/15/2014   PLT 183 07/15/2014       Recent Labs    Lab Results  Component Value  Date   CREATININE 0.79 07/19/2014       Recent Labs    Lab Results  Component Value Date   PSA 2.4 12/24/2014      Pertinent Imaging: Prostate Biopsy Procedure   Informed consent was obtained . A time out was performed to ensure correct patient identity. Transrectal ultrasound probe was placed easily per rectum. The prostate gland was then visualized.  -Transrectal Ultrasound performed revealing a 96.7 gm prostate -No significant hypoechoic. Mild intravesical median lobe noted  Post-Procedure: - Patient tolerated the procedure well   Assessment & Plan: 75 year old male with persistent urinary symptoms including both obstructive and irritative despite maximal medical therapy including Flomax and finasteride. There has been a significant reduction in the size of his prostate from 151 cc to 97 cc.  1. BPH (benign prostatic hyperplasia) We discussed that this point, he may benefit from a bladder at the procedure. Now that his gland is less than 100 cc, he is a  candidate for a transurethral procedure. He understands that outlet procedure will help his obstructive voiding symptoms but may or may not improve his irritative symptoms and may actually worsen them. Given his history of CAD and antiplatelet medication including aspirin and Plavix, he may be best served by laser enucleation to minimize his bleeding. We discussed alternatives including TURP vs. holmium laser enucleation of the prostate vs. greenlight laser ablation. Differences between the surgical procedures were discussed as well as the risks and benefits of each. He is most interested in HoLEP.  We discussed the common postoperative course following holep including need for overnight Foley catheter, temporary worsening of irritative voiding symptoms, and occasional stress incontinence which typically last up to 6 months but can persist. We discussed retrograde ejaculation and damage to surrounding structures including the urinary sphincter. Other uncommon complications including hematuria and urinary tract infection.  He understands all of the above and is willing to proceed as planned.  2. Nocturia We discussed that there is no guarantees that outlet procedure will improve his nocturia and nighttime symptoms of these are more likely to be multi fixed oriole related to his CHF, OSA, and other medical issues. I did stress importance of continued compliance with CPAP.  Schedule HoLEP, cardiac clearance needed preop. Surgery can be done on ASA 81 mg.  Vanna Scotland, MD  [facility] [address], [address] [place], [place] [address] [phone]

## 2015-04-14 NOTE — Op Note (Signed)
Date of procedure: 04/14/2015  Preoperative diagnosis:  1. BPH with bladder outlet obstruction   Postoperative diagnosis:  1. Same as above   Procedure: 1. Holmium laser enucleation of the prostate with morcellation  Surgeon: Vanna Scotland, MD  Anesthesia: General  Complications: None  Intraoperative findings: Large 100 g prostate with trilobar coaptation  EBL: 100 cc  Specimens: Prostate chips  Drains: 20 French two-way Foley catheter  Indication: Andre Hayes is a 75 y.o. patient with an enlarged prostate with significant obstructive voiding symptoms on maximal medical therapy.  After reviewing the management options for treatment, he elected to proceed with the above surgical procedure(s). We have discussed the potential benefits and risks of the procedure, side effects of the proposed treatment, the likelihood of the patient achieving the goals of the procedure, and any potential problems that might occur during the procedure or recuperation. Informed consent has been obtained.  Description of procedure:  The patient was taken to the operating room and general anesthesia was induced.  The patient was placed in the dorsal lithotomy position, prepped and draped in the usual sterile fashion, and preoperative antibiotics were administered. A preoperative time-out was performed.   At this point in time, a 39 French resectoscope and using an obturator was advanced per urethra into the bladder. The urethral meatus was dilated using male sounds prior to introducing the resectoscope. A laser bridge was then brought in as well as a 550  laser fiber. 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 The bladder neck was mildly elevated and the trigone was a reasonably good distance away from the bladder neck. At this point in time, 2 incisions were created at the 5:00 and 7:00 positions extending from the  bladder neck and meeting near the apex of the gland just proximal to the verumontanum.  Working in a distal to proximal direction, the median lobe was then enucleated creating a plane between the adenoma and the capsule rolling the median lobe towards the bladder neck. The lobe was then able to be completely freed from the bladder neck and pushed into the bladder to be morcellated in a later time.  Next, attention was turned to the left lateral lobe. The incision around the left apex was deepened encircling the distal left lateral lobe. Again, plane was created between the capsule and the adenoma freeing the gland distally to proximally rolling it towards the bladder neck. I was eventually able to free this large lobe after cleaning it into 2 pieces and pushing it into the bladder. This created a wide open prostatic fossa. Finally, the right lateral lobe was enucleated in a piece wise fashion again with care to avoid any resection beyond the veru in order to avoid any damage to the sphincter. At this point, the resection was deemed adequate with a wide open HoLEP defect noted.    Next, nephroscope was brought in and using the Piranha morcellator, each of the large and smaller adenoma pieces were carefully morcellated with care taken to avoid any injury to the bladder itself. A few smaller pieces were grasped using Pronto graspers. Once the bladder was cleared of all debris, the scope was removed. A 20 French two-way Foley catheter was placed or a catheter guide into the bladder. The bladder was irrigated to confirm adequate position. The balloon was inflated with 25 cc of sterile water. The patient was administered 10 mg of IV Lasix to  help with diuresis.  The catheter was secured to the patient's left inner thigh. He is cleaned and dried, repositioned the supine position, reversed from anesthesia, and taken to the PACU in stable condition.  Plan: Patient will be admitted overnight for catheter care and for  social issues. We will remove the catheter in the morning if his urine remains clear.  Vanna Scotland, M.D.

## 2015-04-14 NOTE — Anesthesia Postprocedure Evaluation (Signed)
Anesthesia Post Note  Patient: Andre Hayes  Procedure(s) Performed: Procedure(s) (LRB): HOLEP-LASER ENUCLEATION OF THE PROSTATE WITH MORCELLATION (N/A)  Patient location during evaluation: PACU Anesthesia Type: General Level of consciousness: awake and alert Pain management: pain level controlled Vital Signs Assessment: post-procedure vital signs reviewed and stable Respiratory status: spontaneous breathing, nonlabored ventilation, respiratory function stable and patient connected to nasal cannula oxygen Cardiovascular status: blood pressure returned to baseline and stable Postop Assessment: no signs of nausea or vomiting Anesthetic complications: no    Last Vitals:  Filed Vitals:   04/14/15 1340 04/14/15 1352  BP: 135/70   Pulse: 64 63  Temp:    Resp: 28 24    Last Pain:  Filed Vitals:   04/14/15 1402  PainSc: 3                  Cleda Mccreedy Reshanda Lewey

## 2015-04-14 NOTE — Transfer of Care (Signed)
Immediate Anesthesia Transfer of Care Note  Patient: Andre Hayes  Procedure(s) Performed: Procedure(s): HOLEP-LASER ENUCLEATION OF THE PROSTATE WITH MORCELLATION (N/A)  Patient Location: PACU  Anesthesia Type:General  Level of Consciousness: awake, alert , oriented and patient cooperative  Airway & Oxygen Therapy: Patient Spontanous Breathing and Patient connected to nasal cannula oxygen  Post-op Assessment: Report given to RN, Post -op Vital signs reviewed and stable and Patient moving all extremities  Post vital signs: Reviewed and stable  Last Vitals:  Filed Vitals:   04/14/15 0611 04/14/15 1059  BP: 120/64 136/75  Pulse: 76 75  Temp: 36.2 C 36.7 C  Resp: 16 20    Complications: No apparent anesthesia complications

## 2015-04-14 NOTE — Brief Op Note (Signed)
04/14/2015  12:48 PM  PATIENT:  Andre Hayes  75 y.o. male  PRE-OPERATIVE DIAGNOSIS:  BPH  POST-OPERATIVE DIAGNOSIS:  BPH  PROCEDURE:  Procedure(s): HOLEP-LASER ENUCLEATION OF THE PROSTATE WITH MORCELLATION (N/A)  SURGEON:  Surgeon(s) and Role:    * Vanna Scotland, MD - Primary  ASSISTANTS: none   ANESTHESIA:   general  EBL:  Total I/O In: 1000 [I.V.:1000] Out: 50 [Blood:50]  Drains: 20 Fr Foley  Specimen: prostate chips  COUNTS CORRECT: YES  PLAN OF CARE: Admit for overnight observation  PATIENT DISPOSITION:  PACU - hemodynamically stable.

## 2015-04-14 NOTE — Care Management Note (Signed)
Case Management Note  Patient Details  Name: Andre Hayes MRN: [id number] Date of Birth: 1940-09-16  Subjective/Objective:       75yo Andre Hayes was admitted today with BPH and a bladder outlet obstruction. He received surgical intervention today. Hx; CHF, CAD, COPD, Rheumatoid arthritis, BPH. Uses a CPAP at night. Anticipate discharge tomorrow. Case management will follow for discharge planning.              Action/Plan:   Expected Discharge Date:                  Expected Discharge Plan:     In-House Referral:     Discharge planning Services     Post Acute Care Choice:    Choice offered to:     DME Arranged:    DME Agency:     HH Arranged:    HH Agency:     Status of Service:     Medicare Important Message Given:    Date Medicare IM Given:    Medicare IM give by:    Date Additional Medicare IM Given:    Additional Medicare Important Message give by:     If discussed at Long Length of Stay Meetings, dates discussed:    Additional Comments:  Arlissa Monteverde A, RN 04/14/2015, 5:27 PM

## 2015-04-14 NOTE — Anesthesia Procedure Notes (Addendum)
Procedure Name: Intubation Date/Time: 04/14/2015 7:33 AM Performed by: Peyton Najjar Pre-anesthesia Checklist: Patient identified, Patient being monitored, Timeout performed, Emergency Drugs available and Suction available Patient Re-evaluated:Patient Re-evaluated prior to inductionOxygen Delivery Method: Circle system utilized Preoxygenation: Pre-oxygenation with 100% oxygen Intubation Type: IV induction Ventilation: Mask ventilation without difficulty and Oral airway inserted - appropriate to patient size Laryngoscope Size: Mac and 4 Grade View: Grade I Tube type: Oral Tube size: 7.0 mm Number of attempts: 1 Placement Confirmation: ETT inserted through vocal cords under direct vision,  positive ETCO2 and breath sounds checked- equal and bilateral Secured at: 24 cm Tube secured with: Tape Dental Injury: Teeth and Oropharynx as per pre-operative assessment

## 2015-04-14 NOTE — OR Nursing (Signed)
Patient c/o urgency denies pain

## 2015-04-15 ENCOUNTER — Other Ambulatory Visit: Payer: Self-pay

## 2015-04-15 ENCOUNTER — Encounter: Payer: Self-pay | Admitting: Urology

## 2015-04-15 ENCOUNTER — Telehealth: Payer: Self-pay

## 2015-04-15 DIAGNOSIS — Z9889 Other specified postprocedural states: Secondary | ICD-10-CM

## 2015-04-15 DIAGNOSIS — N401 Enlarged prostate with lower urinary tract symptoms: Secondary | ICD-10-CM | POA: Diagnosis not present

## 2015-04-15 LAB — BASIC METABOLIC PANEL
ANION GAP: 3 — AB (ref 5–15)
BUN: 14 mg/dL (ref 6–20)
CALCIUM: 8.1 mg/dL — AB (ref 8.9–10.3)
CO2: 33 mmol/L — AB (ref 22–32)
Chloride: 101 mmol/L (ref 101–111)
Creatinine, Ser: 0.97 mg/dL (ref 0.61–1.24)
Glucose, Bld: 96 mg/dL (ref 65–99)
Potassium: 4.2 mmol/L (ref 3.5–5.1)
Sodium: 137 mmol/L (ref 135–145)

## 2015-04-15 LAB — SURGICAL PATHOLOGY

## 2015-04-15 LAB — HEMOGLOBIN AND HEMATOCRIT, BLOOD
HEMATOCRIT: 39.4 % — AB (ref 40.0–52.0)
Hemoglobin: 12.5 g/dL — ABNORMAL LOW (ref 13.0–18.0)

## 2015-04-15 MED ORDER — OXYCODONE-ACETAMINOPHEN 5-325 MG PO TABS: 1.0000 | ORAL_TABLET | ORAL | Status: DC | PRN

## 2015-04-15 MED ORDER — OXYBUTYNIN CHLORIDE 5 MG PO TABS: 5.0000 mg | ORAL_TABLET | Freq: Three times a day (TID) | ORAL | Status: DC | PRN

## 2015-04-15 MED ORDER — DOCUSATE SODIUM 100 MG PO CAPS: 100.0000 mg | ORAL_CAPSULE | Freq: Every day | ORAL | Status: AC

## 2015-04-15 NOTE — Discharge Summary (Signed)
Urology Discharge Summary  Patient is 75 year old Caucasian male with significant obstructive urinary symptoms in spite of maximal medical therapy who underwent holmium laser the enucleation of the prostate morcellation on 04/14/2015 with Dr. Vanna Scotland.  He was admitted overnight for observation.  He states his night was uneventful for the exception of bladder spasms.  The bladder spasms were abated with Belladonna suppository.  His vital signs are stable. He is afebrile.  His urinary output is good. The Foley catheter is draining a very light pink tinged urine.   Anti-infectives: Anti-infectives    Start     Dose/Rate Route Frequency Ordered Stop   04/14/15 1630  hydroxychloroquine (PLAQUENIL) tablet 200 mg     200 mg Oral Daily 04/14/15 1629     04/14/15 0530  ceFAZolin (ANCEF) 3 g in dextrose 5 % 50 mL IVPB     3 g 130 mL/hr over 30 Minutes Intravenous  Once 04/14/15 0526 04/14/15 0752      Current Facility-Administered Medications  Medication Dose Route Frequency Provider Last Rate Last Dose  . 0.9 %  sodium chloride infusion   Intravenous Continuous Vanna Scotland, MD 125 mL/hr at 04/15/15 0112    . acetaminophen (TYLENOL) tablet 650 mg  650 mg Oral Q4H PRN Vanna Scotland, MD   650 mg at 04/15/15 0105  . albuterol (PROVENTIL) (2.5 MG/3ML) 0.083% nebulizer solution 2.5 mg  2.5 mg Nebulization QID PRN Crystal G Scarpena, RPH      . albuterol (PROVENTIL) (2.5 MG/3ML) 0.083% nebulizer solution 2.5 mg  2.5 mg Nebulization BID Vanna Scotland, MD   2.5 mg at 04/15/15 0724  . aspirin EC tablet 81 mg  81 mg Oral Daily Vanna Scotland, MD   81 mg at 04/14/15 1630  . budesonide (PULMICORT) nebulizer solution 0.5 mg  0.5 mg Nebulization BID Vanna Scotland, MD   0.5 mg at 04/15/15 0724  . diphenhydrAMINE (BENADRYL) injection 12.5 mg  12.5 mg Intravenous Q6H PRN Vanna Scotland, MD       Or  . diphenhydrAMINE (BENADRYL) 12.5 MG/5ML elixir 12.5 mg  12.5 mg Oral Q6H PRN Vanna Scotland, MD      .  docusate sodium (COLACE) capsule 100 mg  100 mg Oral Daily Vanna Scotland, MD   100 mg at 04/14/15 1807  . folic acid (FOLVITE) tablet 1 mg  1 mg Oral Daily Sherry Ruffing, RPH   1 mg at 04/14/15 1645  . furosemide (LASIX) tablet 40 mg  40 mg Oral Daily Vanna Scotland, MD   40 mg at 04/14/15 1806  . gabapentin (NEURONTIN) capsule 200 mg  200 mg Oral Daily Vanna Scotland, MD   200 mg at 04/14/15 1806  . hydroxychloroquine (PLAQUENIL) tablet 200 mg  200 mg Oral Daily Vanna Scotland, MD   200 mg at 04/14/15 1630  . ipratropium-albuterol (DUONEB) 0.5-2.5 (3) MG/3ML nebulizer solution 3 mL  3 mL Nebulization Once PRN Rosaria Ferries, MD      . isosorbide mononitrate (IMDUR) 24 hr tablet 30 mg  30 mg Oral Daily Vanna Scotland, MD   30 mg at 04/14/15 1630  . magnesium oxide (MAG-OX) tablet 400 mg  400 mg Oral Daily Vanna Scotland, MD   400 mg at 04/14/15 1630  . morphine 2 MG/ML injection 2-4 mg  2-4 mg Intravenous Q2H PRN Vanna Scotland, MD      . ondansetron [facility]) injection 4 mg  4 mg Intravenous Q4H PRN Vanna Scotland, MD      . opium-belladonna (B&O  SUPPRETTES) 16.2-60 MG suppository 1 suppository  1 suppository Rectal Q6H PRN Vanna Scotland, MD      . oxybutynin (DITROPAN) tablet 5 mg  5 mg Oral Q8H PRN Vanna Scotland, MD      . oxyCODONE-acetaminophen (PERCOCET/ROXICET) 5-325 MG per tablet 1-2 tablet  1-2 tablet Oral Q4H PRN Vanna Scotland, MD      . psyllium (HYDROCIL/METAMUCIL) packet 1 packet  1 packet Oral Daily Sherry Ruffing, RPH   1 packet at 04/14/15 1645  . simvastatin (ZOCOR) tablet 40 mg  40 mg Oral Daily Vanna Scotland, MD   40 mg at 04/14/15 1630  . tamsulosin (FLOMAX) capsule 0.8 mg  0.8 mg Oral Daily Vanna Scotland, MD   0.8 mg at 04/14/15 1807  . tiotropium (SPIRIVA) inhalation capsule 18 mcg  18 mcg Inhalation Daily Vanna Scotland, MD   18 mcg at 04/14/15 1630     Objective: Vital signs in last 24 hours: Temp:  [98 F (36.7 C)-98.4 F (36.9 C)] 98.3 F (36.8 C)  (01/31 0528) Pulse Rate:  [59-79] 69 (01/31 0528) Resp:  [16-28] 22 (01/31 0528) BP: (99-136)/(34-78) 99/34 mmHg (01/31 0528) SpO2:  [85 %-99 %] 93 % (01/31 0724) Weight:  [271 lb (122.925 kg)] 271 lb (122.925 kg) (01/30 1822)  Intake/Output from previous day: 01/30 0701 - 01/31 0700 In: 1275.4 [P.O.:240; I.V.:1035.4] Out: 3700 [Urine:3650; Blood:50] Intake/Output this shift: Total I/O In: -  Out: 750 [Urine:750]   Physical Exam Constitutional: Well nourished. Alert and oriented, No acute distress. HEENT: [place] AT, moist mucus membranes. Trachea midline, no masses. Cardiovascular: No clubbing, cyanosis, or edema. Respiratory: Normal respiratory effort, no increased work of breathing. GI: Abdomen is soft, non tender, non distended, no abdominal masses. Liver and spleen not palpable.  No hernias appreciated.  Stool sample for occult testing is not indicated.   GU: No CVA tenderness.  No bladder fullness or masses.   Skin: No rashes, bruises or suspicious lesions. Lymph: No cervical or inguinal adenopathy. Neurologic: Grossly intact, no focal deficits, moving all 4 extremities. Psychiatric: Normal mood and affect.  Lab Results:   Recent Labs  04/15/15 0553  HGB 12.5*  HCT 39.4*   BMET  Recent Labs  04/15/15 0553  NA 137  K 4.2  CL 101  CO2 33*  GLUCOSE 96  BUN 14  CREATININE 0.97  CALCIUM 8.1*   PT/INR No results for input(s): LABPROT, INR in the last 72 hours. ABG No results for input(s): PHART, HCO3 in the last 72 hours.  Invalid input(s): PCO2, PO2  Studies/Results: No results found.   Assessment: s/p Procedure(s): HOLEP-LASER ENUCLEATION OF THE PROSTATE WITH MORCELLATION Patient with significant obstructive voiding symptoms in spite of maximal medical therapy who underwent a HOLEP-laser enucleation of the prostate with morcellation on 04/14/2015.  He is ready for discharge. Plan: d/c foley Patient discharged to home.  He is to follow up in one month  in our office for Uroflow, BLUS, IPSS score and office visit.    He is to contact the office if he should develop difficulty urinating, fevers and/or chills.    CC: Dr. Einar Crow, MD       [facility] 04/15/2015

## 2015-04-15 NOTE — Care Management Obs Status (Signed)
MEDICARE OBSERVATION STATUS NOTIFICATION   Patient Details  Name: Andre Hayes MRN: [id number] Date of Birth: 12/01/1940   Medicare Observation Status Notification Given:  Yes    Eber Hong, RN 04/15/2015, 8:40 AM

## 2015-04-15 NOTE — Progress Notes (Signed)
Nurse from hospital called stating pt is wanting to get script for oxybutynin filled. Nurse stated when she ordered the medications she sent them to pt mail order pharmacy. Reordered medication to local pharmacy.

## 2015-04-15 NOTE — Telephone Encounter (Signed)
Pt called after being d/c from hospital post surgery stating he is having some incontinence issues. Made pt aware after the surgery he had this is very common and normal. Per Dr. Apolinar Junes pt should have received oxybutynin when being d/c. Pt found script and stated he would have it filled. Pt will call back with any other issues.

## 2015-04-16 ENCOUNTER — Encounter: Payer: Self-pay | Admitting: Urology

## 2015-04-21 ENCOUNTER — Telehealth: Payer: Self-pay

## 2015-04-21 NOTE — Telephone Encounter (Signed)
Pt called wanting to know why he still has blood in his urine. Made aware that the prostate has to heal. Blood could be present for a bit longer. Pt then stated that he elected to have this procedure to help with his urinating habit during the night and at this point none of that has changed. Made aware that once again the prostate has to heal and that can take several weeks. Once his body has healed then at which point he should see an improvement. Pt voiced understanding.

## 2015-04-22 ENCOUNTER — Telehealth: Payer: Self-pay | Admitting: Urology

## 2015-04-22 NOTE — Telephone Encounter (Signed)
Pt had surgery last Monday, 1/30.  His urine yesterday was almost clear but today it's back to being a real dark red wine color.  Is this normal.  Why did it switch from light to dark.  Is this what he can expect until he is fully recovered.  Please call patient.

## 2015-04-23 NOTE — Telephone Encounter (Signed)
Spoke with pt in reference to blood in the urine post surgery. Pt denied n/v, f/c, dysuria, or inability to urinate. Pt states that urine flow at times just dribbles and then the flow starts well and other times the flow is nice and strong. Reinforced with pt that can be part of the healing process. Blood in the urine is not always a bad thing post surgery it just becomes a problem if he can not urinate or develops pain. Reinforced with pt to continue to drink plenty of fluids. Pt voiced understanding.

## 2015-04-30 ENCOUNTER — Ambulatory Visit: Payer: Commercial Managed Care - HMO | Admitting: Family

## 2015-05-05 ENCOUNTER — Other Ambulatory Visit: Payer: Self-pay

## 2015-05-26 ENCOUNTER — Telehealth: Payer: Self-pay | Admitting: Radiology

## 2015-05-26 NOTE — Telephone Encounter (Signed)
Pt called stating he is out of finasteride & oxybutynin and wonders if he needs to continue medications or is it ok to stop them.  Also pt hasn't had a f/u appt after HOLEP performed 04/14/15. When should we schedule him for a f/u visit?

## 2015-05-26 NOTE — Telephone Encounter (Signed)
Arranged f/u appt with pt. Pt states he is out of finasteride but has a few oxybutynin left. Advised pt he could stop the finasteride but continue taking oxybutynin & Dr Apolinar Junes would reassess at his next visit. Pt voices understanding.

## 2015-05-26 NOTE — Telephone Encounter (Signed)
She was supposed to come back at 6 weeks postop. Please arrange a follow-up. HE can probably stop both.  We can reassess at visit.  (definately stop finasteride/ maybe oxybutynin)  Vanna Scotland, MD

## 2015-05-28 ENCOUNTER — Ambulatory Visit: Payer: Medicare HMO | Admitting: Family

## 2015-06-05 ENCOUNTER — Encounter: Payer: Self-pay | Admitting: Urology

## 2015-06-05 ENCOUNTER — Ambulatory Visit (INDEPENDENT_AMBULATORY_CARE_PROVIDER_SITE_OTHER): Payer: Medicare HMO | Admitting: Urology

## 2015-06-05 VITALS — BP 143/68 | HR 91 | Ht 72.0 in | Wt 260.0 lb

## 2015-06-05 DIAGNOSIS — I272 Pulmonary hypertension, unspecified: Secondary | ICD-10-CM | POA: Insufficient documentation

## 2015-06-05 DIAGNOSIS — I10 Essential (primary) hypertension: Secondary | ICD-10-CM | POA: Insufficient documentation

## 2015-06-05 DIAGNOSIS — N400 Enlarged prostate without lower urinary tract symptoms: Secondary | ICD-10-CM | POA: Diagnosis not present

## 2015-06-05 DIAGNOSIS — R351 Nocturia: Secondary | ICD-10-CM

## 2015-06-05 DIAGNOSIS — I2510 Atherosclerotic heart disease of native coronary artery without angina pectoris: Secondary | ICD-10-CM | POA: Insufficient documentation

## 2015-06-05 LAB — BLADDER SCAN AMB NON-IMAGING

## 2015-06-05 LAB — URINALYSIS, COMPLETE
Bilirubin, UA: NEGATIVE
GLUCOSE, UA: NEGATIVE
Ketones, UA: NEGATIVE
Nitrite, UA: NEGATIVE
PROTEIN UA: NEGATIVE
Specific Gravity, UA: 1.015 (ref 1.005–1.030)
Urobilinogen, Ur: 0.2 mg/dL (ref 0.2–1.0)
pH, UA: 5 (ref 5.0–7.5)

## 2015-06-05 LAB — MICROSCOPIC EXAMINATION: EPITHELIAL CELLS (NON RENAL): NONE SEEN /HPF (ref 0–10)

## 2015-06-05 NOTE — Progress Notes (Signed)
06/05/2015 11:05 AM   Andre Hayes Jul 21, 1940 [id number]  Referring provider: Lauro Regulus, MD [address] [facility] [place] [facility] [place], [place] [address]  Chief Complaint  Patient presents with  . Benign Prostatic Hypertrophy    6wk post op    HPI: 75 yo M  With BPH s/p HoLEP 04/14/15 who returns today for post op.  His stream has improved and his has very few daytime symptoms.    He also feels that he is emptying his bladder better.  No SUI or leakage.  Minimal daytime symptoms other than some post op urgency which is slowly improving.  Overall, he is very happy with the outcome.  He still gets up 2-3 times nightly.  He continues to use his CPAP but not consistently throughout the night.    He stopped taking finasteride post op.  He continues to take Flomax.  Surgical pathology shows glandular/ stromal hyperplasia.  18 g.     PVR today <50 cc.    PMH: Past Medical History  Diagnosis Date  . COPD (chronic obstructive pulmonary disease) (HCC)   . Hyperlipidemia   . Pneumonia 05/02/14    not hospitalized  . Rheumatoid arthritis (HCC)   . Pulmonary disease   . CAD (coronary artery disease)   . Congestive heart failure (HCC)     NYHA 3, EF>55%  . Duodenal ulcer   . BPH (benign prostatic hypertrophy)   . Hypertension   . Heart murmur   . Myocardial infarction (HCC)   . Sleep apnea   . Aortic stenosis   . Headache   . History of peptic ulcer disease   . Senile cataracts of both eyes     Surgical History: Past Surgical History  Procedure Laterality Date  . Lung surgery    . Coronary angioplasty with stent placement      Dr. Juliann Pares  . Holep-laser enucleation of the prostate with morcellation N/A 04/14/2015    Procedure: HOLEP-LASER ENUCLEATION OF THE PROSTATE WITH MORCELLATION;  Surgeon: Vanna Scotland, MD;  Location: [facility] ORS;  Service: Urology;  Laterality: N/A;    Home Medications:    Medication List       This list is accurate as  of: 06/05/15 11:05 AM.  Always use your most recent med list.               albuterol 108 (90 Base) MCG/ACT inhaler  Commonly known as:  PROVENTIL HFA;VENTOLIN HFA  Inhale 1-2 puffs into the lungs 4 (four) times daily as needed for shortness of breath. Reported on 04/14/2015     albuterol (2.5 MG/3ML) 0.083% nebulizer solution  Commonly known as:  PROVENTIL  Take 2.5 mg by nebulization 2 (two) times daily.     aspirin EC 81 MG tablet  Take 81 mg by mouth daily. Reported on 04/14/2015     clopidogrel 75 MG tablet  Commonly known as:  PLAVIX  Take 75 mg by mouth daily.     docusate sodium 100 MG capsule  Commonly known as:  COLACE  Take 1 capsule (100 mg total) by mouth daily.     fluticasone 110 MCG/ACT inhaler  Commonly known as:  FLOVENT HFA  Inhale 1 puff into the lungs 2 (two) times daily.     folic acid 400 MCG tablet  Commonly known as:  FOLVITE  Take 400 mcg by mouth daily.     furosemide 20 MG tablet  Commonly known as:  LASIX  Take  40 mg by mouth daily.     gabapentin 100 MG capsule  Commonly known as:  NEURONTIN  Take 200 mg by mouth daily.     hydroxychloroquine 200 MG tablet  Commonly known as:  PLAQUENIL  Take 200 mg by mouth daily.     isosorbide mononitrate 30 MG 24 hr tablet  Commonly known as:  IMDUR  Take 30 mg by mouth daily.     magnesium oxide 400 MG tablet  Commonly known as:  MAG-OX  Take 400 mg by mouth daily.     simvastatin 40 MG tablet  Commonly known as:  ZOCOR  Take 40 mg by mouth daily.     tamsulosin 0.4 MG Caps capsule  Commonly known as:  FLOMAX  Take 0.8 mg by mouth daily.     TGT PSYLLIUM FIBER 0.52 g capsule  Generic drug:  psyllium  Take 0.52 g by mouth daily.     tiotropium 18 MCG inhalation capsule  Commonly known as:  SPIRIVA  Place 18 mcg into inhaler and inhale daily.     vitamin B-12 500 MCG tablet  Commonly known as:  CYANOCOBALAMIN  Take 500 mcg by mouth daily.     Vitamin D 2000 units tablet  Take  2,000 Units by mouth daily.        Allergies:  Allergies  Allergen Reactions  . Erythromycin Rash  . Erythromycin Base Rash    Family History: Family History  Problem Relation Age of Onset  . Tuberculosis Mother   . COPD Mother   . Emphysema Mother   . Heart disease Father   . Stomach cancer Father   . Breast cancer Sister   . Colon cancer Brother     Social History:  reports that he quit smoking about 51 years ago. His smoking use included Cigarettes. He started smoking about 61 years ago. He has a 10 pack-year smoking history. He has never used smokeless tobacco. He reports that he does not drink alcohol or use illicit drugs.  ROS: UROLOGY Frequent Urination?: Yes Hard to postpone urination?: Yes Burning/pain with urination?: No Get up at night to urinate?: Yes Leakage of urine?: Yes Urine stream starts and stops?: No Trouble starting stream?: No Do you have to strain to urinate?: No Blood in urine?: No Urinary tract infection?: No Sexually transmitted disease?: No Injury to kidneys or bladder?: No Painful intercourse?: No Weak stream?: No Erection problems?: No Penile pain?: No  Gastrointestinal Nausea?: No Vomiting?: No Indigestion/heartburn?: No Diarrhea?: No Constipation?: No  Constitutional Fever: No Night sweats?: No Weight loss?: No Fatigue?: No  Skin Skin rash/lesions?: No Itching?: No  Eyes Blurred vision?: Yes Double vision?: No  Ears/Nose/Throat Sore throat?: No Sinus problems?: No  Hematologic/Lymphatic Swollen glands?: No Easy bruising?: No  Cardiovascular Leg swelling?: No Chest pain?: No  Respiratory Cough?: No Shortness of breath?: Yes  Endocrine Excessive thirst?: No  Musculoskeletal Back pain?: No Joint pain?: No  Neurological Headaches?: No Dizziness?: No  Psychologic Depression?: No Anxiety?: No  Physical Exam: BP 143/68 mmHg  Pulse 91  Ht 6' (1.829 m)  Wt 260 lb (117.935 kg)  BMI 35.25 kg/m2    Constitutional:  Alert and oriented, No acute distress. HEENT: [place] AT, moist mucus membranes.  Trachea midline, no masses. Cardiovascular: No clubbing, cyanosis, or edema. Respiratory: Normal respiratory effort, no increased work of breathing. GI: Abdomen is soft, nontender, nondistended, no abdominal masses GU: No CVA tenderness.  Skin: No rashes, bruises or suspicious lesions. Neurologic:  Grossly intact, no focal deficits, moving all 4 extremities. Psychiatric: Normal mood and affect.  Laboratory Data: Lab Results  Component Value Date   WBC 10.1 04/03/2015   HGB 12.5* 04/15/2015   HCT 39.4* 04/15/2015   MCV 76.7* 04/03/2015   PLT 158 04/03/2015    Lab Results  Component Value Date   CREATININE 0.97 04/15/2015   PSA 2.4 on 12/24/2014  Urinalysis Results for orders placed or performed in visit on 06/05/15  Microscopic Examination  Result Value Ref Range   WBC, UA 6-10 (A) 0 -  5 /hpf   RBC, UA 0-2 0 -  2 /hpf   Epithelial Cells (non renal) None seen 0 - 10 /hpf   Mucus, UA Present (A) Not Estab.   Bacteria, UA Few (A) None seen/Few  Urinalysis, Complete  Result Value Ref Range   Specific Gravity, UA 1.015 1.005 - 1.030   pH, UA 5.0 5.0 - 7.5   Color, UA Yellow Yellow   Appearance Ur Clear Clear   Leukocytes, UA 2+ (A) Negative   Protein, UA Negative Negative/Trace   Glucose, UA Negative Negative   Ketones, UA Negative Negative   RBC, UA 1+ (A) Negative   Bilirubin, UA Negative Negative   Urobilinogen, Ur 0.2 0.2 - 1.0 mg/dL   Nitrite, UA Negative Negative   Microscopic Examination See below:   BLADDER SCAN AMB NON-IMAGING  Result Value Ref Range   Scan Result 71ml     Pertinent Imaging: PVR as above  Assessment & Plan:   1. BPH (benign prostatic hyperplasia) Doing well s/p HoLEP Pathology benign PVR minimal Stop flomax, finasteride stopped immediately post op  - Urinalysis, Complete - BLADDER SCAN AMB NON-IMAGING  2. Nocturia Stressed compliance  with CPAP Likely multifactorial  Patient informed preop that outlet procedure will likely not improve his nocturia.  Return in about 6 months (around 12/06/2015) for IPSS, PSA/ DRE, PVR.  Vanna Scotland, MD  [facility] [address] [place], [place] [address] [phone]

## 2015-06-18 ENCOUNTER — Encounter: Payer: Self-pay | Admitting: *Deleted

## 2015-06-22 NOTE — H&P (Signed)
See scanned note.

## 2015-06-23 ENCOUNTER — Encounter: Payer: Self-pay | Admitting: *Deleted

## 2015-06-23 ENCOUNTER — Ambulatory Visit: Payer: Medicare HMO | Admitting: Anesthesiology

## 2015-06-23 ENCOUNTER — Encounter
Admission: RE | Disposition: A | Discharge status: Home / Self Care | Payer: Self-pay | Source: Ambulatory Visit | Attending: Ophthalmology

## 2015-06-23 ENCOUNTER — Ambulatory Visit
Admission: RE | Admit: 2015-06-23 | Discharge: 2015-06-23 | Disposition: A | Discharge status: Home / Self Care | Payer: Medicare HMO | Source: Ambulatory Visit | Attending: Ophthalmology | Admitting: Ophthalmology

## 2015-06-23 DIAGNOSIS — G4730 Sleep apnea, unspecified: Secondary | ICD-10-CM | POA: Insufficient documentation

## 2015-06-23 DIAGNOSIS — E7800 Pure hypercholesterolemia, unspecified: Secondary | ICD-10-CM | POA: Diagnosis not present

## 2015-06-23 DIAGNOSIS — J42 Unspecified chronic bronchitis: Secondary | ICD-10-CM | POA: Insufficient documentation

## 2015-06-23 DIAGNOSIS — H2511 Age-related nuclear cataract, right eye: Secondary | ICD-10-CM | POA: Diagnosis not present

## 2015-06-23 DIAGNOSIS — Z955 Presence of coronary angioplasty implant and graft: Secondary | ICD-10-CM | POA: Diagnosis not present

## 2015-06-23 DIAGNOSIS — M069 Rheumatoid arthritis, unspecified: Secondary | ICD-10-CM | POA: Diagnosis not present

## 2015-06-23 DIAGNOSIS — R51 Headache: Secondary | ICD-10-CM | POA: Insufficient documentation

## 2015-06-23 DIAGNOSIS — I110 Hypertensive heart disease with heart failure: Secondary | ICD-10-CM | POA: Diagnosis not present

## 2015-06-23 DIAGNOSIS — R011 Cardiac murmur, unspecified: Secondary | ICD-10-CM | POA: Insufficient documentation

## 2015-06-23 DIAGNOSIS — Z9981 Dependence on supplemental oxygen: Secondary | ICD-10-CM | POA: Diagnosis not present

## 2015-06-23 DIAGNOSIS — R0601 Orthopnea: Secondary | ICD-10-CM | POA: Insufficient documentation

## 2015-06-23 DIAGNOSIS — I509 Heart failure, unspecified: Secondary | ICD-10-CM | POA: Insufficient documentation

## 2015-06-23 DIAGNOSIS — M25473 Effusion, unspecified ankle: Secondary | ICD-10-CM | POA: Diagnosis not present

## 2015-06-23 DIAGNOSIS — Z87891 Personal history of nicotine dependence: Secondary | ICD-10-CM | POA: Insufficient documentation

## 2015-06-23 DIAGNOSIS — N400 Enlarged prostate without lower urinary tract symptoms: Secondary | ICD-10-CM | POA: Insufficient documentation

## 2015-06-23 DIAGNOSIS — R062 Wheezing: Secondary | ICD-10-CM | POA: Diagnosis not present

## 2015-06-23 DIAGNOSIS — I252 Old myocardial infarction: Secondary | ICD-10-CM | POA: Insufficient documentation

## 2015-06-23 DIAGNOSIS — R0602 Shortness of breath: Secondary | ICD-10-CM | POA: Diagnosis not present

## 2015-06-23 DIAGNOSIS — M1990 Unspecified osteoarthritis, unspecified site: Secondary | ICD-10-CM | POA: Diagnosis not present

## 2015-06-23 HISTORY — DX: Cardiac murmur, unspecified: R01.1

## 2015-06-23 HISTORY — DX: Reserved for inherently not codable concepts without codable children: IMO0001

## 2015-06-23 HISTORY — DX: Bronchitis, not specified as acute or chronic: J40

## 2015-06-23 HISTORY — DX: Wheezing: R06.2

## 2015-06-23 HISTORY — DX: Orthopnea: R06.01

## 2015-06-23 HISTORY — PX: CATARACT EXTRACTION W/PHACO: SHX586

## 2015-06-23 HISTORY — DX: Heart failure, unspecified: I50.9

## 2015-06-23 HISTORY — DX: Localized edema: R60.0

## 2015-06-23 SURGERY — PHACOEMULSIFICATION, CATARACT, WITH IOL INSERTION
Anesthesia: Monitor Anesthesia Care | Site: Eye | Laterality: Right | Wound class: Clean

## 2015-06-23 MED ORDER — LIDOCAINE HCL (PF) 4 % IJ SOLN
INTRAMUSCULAR | Status: AC
  Filled 2015-06-23: qty 5

## 2015-06-23 MED ORDER — MIDAZOLAM HCL 2 MG/2ML IJ SOLN
INTRAMUSCULAR | Status: DC | PRN
  Administered 2015-06-23: 1 mg via INTRAVENOUS

## 2015-06-23 MED ORDER — PHENYLEPHRINE HCL 10 % OP SOLN
1.0000 [drp] | OPHTHALMIC | Status: AC
  Administered 2015-06-23 (×4): 1 [drp] via OPHTHALMIC

## 2015-06-23 MED ORDER — LIDOCAINE HCL (PF) 4 % IJ SOLN
INTRAMUSCULAR | Status: DC | PRN
  Administered 2015-06-23: 4 mL via OPHTHALMIC

## 2015-06-23 MED ORDER — PHENYLEPHRINE HCL 10 % OP SOLN
OPHTHALMIC | Status: AC
  Filled 2015-06-23: qty 5

## 2015-06-23 MED ORDER — EPINEPHRINE HCL 1 MG/ML IJ SOLN
INTRAMUSCULAR | Status: AC
  Filled 2015-06-23: qty 2

## 2015-06-23 MED ORDER — BUPIVACAINE HCL (PF) 0.75 % IJ SOLN
INTRAMUSCULAR | Status: AC
  Filled 2015-06-23: qty 10

## 2015-06-23 MED ORDER — MOXIFLOXACIN HCL 0.5 % OP SOLN
OPHTHALMIC | Status: AC
  Filled 2015-06-23: qty 3

## 2015-06-23 MED ORDER — LIDOCAINE HCL (PF) 4 % IJ SOLN
INTRAOCULAR | Status: DC | PRN
  Administered 2015-06-23: .5 mL via OPHTHALMIC

## 2015-06-23 MED ORDER — CYCLOPENTOLATE HCL 2 % OP SOLN
1.0000 [drp] | OPHTHALMIC | Status: AC
  Administered 2015-06-23 (×4): 1 [drp] via OPHTHALMIC

## 2015-06-23 MED ORDER — CEFUROXIME OPHTHALMIC INJECTION 1 MG/0.1 ML
INJECTION | OPHTHALMIC | Status: AC
  Filled 2015-06-23: qty 0.1

## 2015-06-23 MED ORDER — CARBACHOL 0.01 % IO SOLN
INTRAOCULAR | Status: DC | PRN
  Administered 2015-06-23: .5 mL via INTRAOCULAR

## 2015-06-23 MED ORDER — CEFUROXIME OPHTHALMIC INJECTION 1 MG/0.1 ML
INJECTION | OPHTHALMIC | Status: DC | PRN
  Administered 2015-06-23: .1 mL via INTRACAMERAL

## 2015-06-23 MED ORDER — SODIUM CHLORIDE 0.9 % IV SOLN
INTRAVENOUS | Status: DC
Start: 2015-06-23 — End: 2015-06-23
  Administered 2015-06-23: 08:00:00 via INTRAVENOUS

## 2015-06-23 MED ORDER — NA CHONDROIT SULF-NA HYALURON 40-17 MG/ML IO SOLN
INTRAOCULAR | Status: DC | PRN
  Administered 2015-06-23: 1 mL via INTRAOCULAR

## 2015-06-23 MED ORDER — NA CHONDROIT SULF-NA HYALURON 40-17 MG/ML IO SOLN
INTRAOCULAR | Status: AC
  Filled 2015-06-23: qty 1

## 2015-06-23 MED ORDER — ALFENTANIL 500 MCG/ML IJ INJ
INJECTION | INTRAMUSCULAR | Status: DC | PRN
  Administered 2015-06-23: 500 ug via INTRAVENOUS

## 2015-06-23 MED ORDER — TETRACAINE HCL 0.5 % OP SOLN
OPHTHALMIC | Status: AC
  Filled 2015-06-23: qty 2

## 2015-06-23 MED ORDER — MOXIFLOXACIN HCL 0.5 % OP SOLN
OPHTHALMIC | Status: DC | PRN
  Administered 2015-06-23: 1 [drp] via OPHTHALMIC

## 2015-06-23 MED ORDER — POVIDONE-IODINE 5 % OP SOLN
OPHTHALMIC | Status: AC
  Filled 2015-06-23: qty 30

## 2015-06-23 MED ORDER — TETRACAINE HCL 0.5 % OP SOLN
OPHTHALMIC | Status: DC | PRN
  Administered 2015-06-23: 1 [drp] via OPHTHALMIC

## 2015-06-23 MED ORDER — HYALURONIDASE HUMAN 150 UNIT/ML IJ SOLN
INTRAMUSCULAR | Status: AC
  Filled 2015-06-23: qty 1

## 2015-06-23 MED ORDER — POVIDONE-IODINE 5 % OP SOLN
OPHTHALMIC | Status: DC | PRN
  Administered 2015-06-23: 1 via OPHTHALMIC

## 2015-06-23 MED ORDER — EPINEPHRINE HCL 1 MG/ML IJ SOLN
INTRAOCULAR | Status: DC | PRN
  Administered 2015-06-23: 1 mL via OPHTHALMIC

## 2015-06-23 MED ORDER — MOXIFLOXACIN HCL 0.5 % OP SOLN
1.0000 [drp] | OPHTHALMIC | Status: AC
  Administered 2015-06-23 (×3): 1 [drp] via OPHTHALMIC

## 2015-06-23 MED ORDER — CYCLOPENTOLATE HCL 2 % OP SOLN
OPHTHALMIC | Status: AC
  Filled 2015-06-23: qty 2

## 2015-06-23 SURGICAL SUPPLY — 29 items

## 2015-06-23 NOTE — Anesthesia Postprocedure Evaluation (Signed)
Anesthesia Post Note  Patient: TAJAH SCHREINER  Procedure(s) Performed: Procedure(s) (LRB): CATARACT EXTRACTION PHACO AND INTRAOCULAR LENS PLACEMENT (IOC) (Right)  Patient location during evaluation: Other Anesthesia Type: General Level of consciousness: awake and alert Pain management: pain level controlled Vital Signs Assessment: post-procedure vital signs reviewed and stable Respiratory status: spontaneous breathing, nonlabored ventilation, respiratory function stable and patient connected to nasal cannula oxygen Cardiovascular status: blood pressure returned to baseline and stable Postop Assessment: no signs of nausea or vomiting Anesthetic complications: no    Last Vitals:  Filed Vitals:   06/23/15 0947 06/23/15 0953  BP: 147/72 137/70  Pulse: 68   Temp: 36.9 C   Resp: 16 16    Last Pain: There were no vitals filed for this visit.               Evon Dejarnett S

## 2015-06-23 NOTE — Transfer of Care (Signed)
Immediate Anesthesia Transfer of Care Note  Patient: Andre Hayes  Procedure(s) Performed: Procedure(s) with comments: CATARACT EXTRACTION PHACO AND INTRAOCULAR LENS PLACEMENT (IOC) (Right) - [place] 01:36 AP% 24.5 CDE 41.30 fluid pack lot # [id number]  Patient Location: PACU  Anesthesia Type:MAC  Level of Consciousness: awake, alert , oriented and patient cooperative  Airway & Oxygen Therapy: Patient Spontanous Breathing  Post-op Assessment: Report given to RN and Post -op Vital signs reviewed and stable  Post vital signs: Reviewed and stable  Last Vitals:  Filed Vitals:   06/23/15 0736 06/23/15 0947  BP: 124/64 147/72  Pulse: 70 68  Temp: 36.9 C 36.9 C  Resp: 16 16    Complications: No apparent anesthesia complications

## 2015-06-23 NOTE — Anesthesia Preprocedure Evaluation (Addendum)
Anesthesia Evaluation  Patient identified by MRN, date of birth, ID band Patient awake    Reviewed: Allergy & Precautions, NPO status , Patient's Chart, lab work & pertinent test results, reviewed documented beta blocker date and time   Airway Mallampati: III  TM Distance: >3 FB     Dental  (+) Chipped, Upper Dentures   Pulmonary shortness of breath, sleep apnea and Continuous Positive Airway Pressure Ventilation , pneumonia, resolved, COPD, former smoker,           Cardiovascular hypertension, Pt. on medications + CAD, + Past MI, +CHF and + Orthopnea       Neuro/Psych  Headaches,    GI/Hepatic   Endo/Other    Renal/GU      Musculoskeletal  (+) Arthritis ,   Abdominal   Peds  Hematology   Anesthesia Other Findings   Reproductive/Obstetrics                            Anesthesia Physical Anesthesia Plan  ASA: III  Anesthesia Plan: MAC   Post-op Pain Management:    Induction:   Airway Management Planned:   Additional Equipment:   Intra-op Plan:   Post-operative Plan:   Informed Consent: I have reviewed the patients History and Physical, chart, labs and discussed the procedure including the risks, benefits and alternatives for the proposed anesthesia with the patient or authorized representative who has indicated his/her understanding and acceptance.     Plan Discussed with: CRNA  Anesthesia Plan Comments:         Anesthesia Quick Evaluation

## 2015-06-23 NOTE — Interval H&P Note (Signed)
History and Physical Interval Note:  06/23/2015 9:01 AM  Andre Hayes  has presented today for surgery, with the diagnosis of cataract  The various methods of treatment have been discussed with the patient and family. After consideration of risks, benefits and other options for treatment, the patient has consented to  Procedure(s): CATARACT EXTRACTION PHACO AND INTRAOCULAR LENS PLACEMENT (IOC) (Right) as a surgical intervention .  The patient's history has been reviewed, patient examined, no change in status, stable for surgery.  I have reviewed the patient's chart and labs.  Questions were answered to the patient's satisfaction.     Ruvim Risko

## 2015-06-23 NOTE — Op Note (Signed)
Date of Surgery: 06/23/2015 Date of Dictation: 06/23/2015 9:48 AM Pre-operative Diagnosis:  Nuclear Sclerotic Cataract right Eye Post-operative Diagnosis: same Procedure performed: Extra-capsular Cataract Extraction (ECCE) with placement of a posterior chamber intraocular lens (IOL) right Eye IOL:  Implant Name Type Inv. Item Serial No. Manufacturer Lot No. LRB No. Used  LENS IOL ACRYSOF IQ 22.0 - [id number] Intraocular Lens LENS IOL ACRYSOF IQ 22.0 [id number] ALCON   Right 1   Anesthesia: 2% Lidocaine and 4% Marcaine in a 50/50 mixture with 10 unites/ml of Hylenex given as a peribulbar Anesthesiologist: Anesthesiologist: Berdine Addison, MD CRNA: Peyton Najjar, CRNA Complications: none Estimated Blood Loss: less than 1 ml  Description of procedure:  The patient was given anesthesia and sedation via intravenous access. The patient was then prepped and draped in the usual fashion. A 25-gauge needle was bent for initiating the capsulorhexis. A 5-0 silk suture was placed through the conjunctiva superior and inferiorly to serve as bridle sutures. Hemostasis was obtained at the superior limbus using an eraser cautery. A partial thickness groove was made at the anterior surgical limbus with a 64 Beaver blade and this was dissected anteriorly with an [employer]SCO. The anterior chamber was entered at 10 o'clock with a 1.0 mm paracentesis knife and through the lamellar dissection with a 2.6 mm Alcon keratome. Epi-Shugarcaine 0.5 CC [9 cc BSS Plus (Alcon), 3 cc 4% preservative-free lidocaine (Hospira) and 4 cc 1:1000 preservative-free, bisulfite-free epinephrine] was injected into the anterior chamber via the paracentesis tract. Epi-Shugarcaine 0.5 CC [9 cc BSS Plus (Alcon), 3 cc 4% preservative-free lidocaine (Hospira) and 4 cc 1:1000 preservative-free, bisulfite-free epinephrine] was injected into the anterior chamber via the paracentesis tract. DiscoVisc was injected to replace the aqueous and a  continuous tear curvilinear capsulorhexis was performed using a bent 25-gauge needle.  Balance salt on a syringe was used to perform hydro-dissection and phacoemulsification was carried out using a divide and conquer technique. Procedure(s) with comments: CATARACT EXTRACTION PHACO AND INTRAOCULAR LENS PLACEMENT (IOC) (Right) - [place] 01:36 AP% 24.5 CDE 41.30 fluid pack lot # [id number]. Irrigation/aspiration was used to remove the residual cortex and the capsular bag was inflated with DiscoVisc. The intraocular lens was inserted into the capsular bag using a pre-loaded UltraSert Delivery System. Irrigation/aspiration was used to remove the residual DiscoVisc. The wound was inflated with balanced salt and checked for leaks. None were found. Miostat was injected via the paracentesis track and 0.1 ml of cefuroxime containing 1 mg of drug  was injected via the paracentesis track. The wound was checked for leaks again and none were found.   The bridal sutures were removed and two drops of Vigamox were placed on the eye. An eye shield was placed to protect the eye and the patient was discharged to the recovery area in good condition.   Lalia Loudon MD

## 2015-06-23 NOTE — Discharge Instructions (Signed)
AMBULATORY SURGERY  DISCHARGE INSTRUCTIONS   1) The drugs that you were given will stay in your system until tomorrow so for the next 24 hours you should not:  A) Drive an automobile B) Make any legal decisions C) Drink any alcoholic beverage   2) You may resume regular meals tomorrow.  Today it is better to start with liquids and gradually work up to solid foods.  You may eat anything you prefer, but it is better to start with liquids, then soup and crackers, and gradually work up to solid foods.   3) Please notify your doctor immediately if you have any unusual bleeding, trouble breathing, redness and pain at the surgery site, drainage, fever, or pain not relieved by medication.    4) Additional Instructions:   Eye Surgery Discharge Instructions  Expect mild scratchy sensation or mild soreness. DO NOT RUB YOUR EYE!  The day of surgery:  Minimal physical activity, but bed rest is not required  No reading, computer work, or close hand work  No bending, lifting, or straining.  May watch TV  For 24 hours:  No driving, legal decisions, or alcoholic beverages  Safety precautions  Eat anything you prefer: It is better to start with liquids, then soup then solid foods.  _____ Eye patch should be worn until postoperative exam tomorrow.  ____ Solar shield eyeglasses should be worn for comfort in the sunlight/patch while sleeping  Resume all regular medications including aspirin or Coumadin if these were discontinued prior to surgery. You may shower, bathe, shave, or wash your hair. Tylenol may be taken for mild discomfort.  Call your doctor if you experience significant pain, nausea, or vomiting, fever > 101 or other signs of infection. [phone] or [phone] Specific instructions:  Follow-up Information    Follow up with Sallee Lange, MD.   Specialty:  Ophthalmology   Why:  April 11 at 10:45am   Contact information:   [address]   [place]  [place] [address] [phone]          Please contact your physician with any problems or Same Day Surgery at [phone], Monday through Friday 6 am to 4 pm, or [place] at [facility] number at [phone].

## 2015-06-26 ENCOUNTER — Telehealth: Payer: Self-pay | Admitting: Radiology

## 2015-06-26 NOTE — Telephone Encounter (Signed)
OK to stop flomax per my last note.  Vanna Scotland, MD

## 2015-06-26 NOTE — Telephone Encounter (Signed)
Pt called back saying he's completely out of Tamsulosin and needs a 30 day Rx sent to his pharmacy. Please give him a phone call regarding this.  Pt's ph# [phone]  Thank you.

## 2015-06-26 NOTE — Telephone Encounter (Signed)
Pt states he is out of flomax & would like to know if he needs a refill. He had a HOLEP on 04/14/15 and states his urinary symptoms have improved so he doesn't know if he needs a refill. Please advise.

## 2015-06-30 NOTE — Telephone Encounter (Signed)
Pt has been made aware to stop flomax.

## 2015-07-03 ENCOUNTER — Ambulatory Visit: Payer: Medicare HMO | Admitting: Family

## 2015-08-14 ENCOUNTER — Other Ambulatory Visit: Payer: Self-pay | Admitting: Internal Medicine

## 2015-08-14 ENCOUNTER — Ambulatory Visit
Admission: RE | Admit: 2015-08-14 | Discharge: 2015-08-14 | Disposition: A | Discharge status: Home / Self Care | Payer: Medicare HMO | Source: Ambulatory Visit | Attending: Internal Medicine | Admitting: Internal Medicine

## 2015-08-14 DIAGNOSIS — J349 Unspecified disorder of nose and nasal sinuses: Secondary | ICD-10-CM | POA: Diagnosis not present

## 2015-08-14 DIAGNOSIS — R270 Ataxia, unspecified: Secondary | ICD-10-CM | POA: Diagnosis present

## 2015-08-15 ENCOUNTER — Encounter: Payer: Self-pay | Admitting: Family

## 2015-08-15 ENCOUNTER — Ambulatory Visit: Payer: Medicare HMO | Attending: Family | Admitting: Family

## 2015-08-15 VITALS — BP 113/51 | HR 79 | Resp 20 | Ht 72.0 in | Wt 255.0 lb

## 2015-08-15 DIAGNOSIS — N400 Enlarged prostate without lower urinary tract symptoms: Secondary | ICD-10-CM | POA: Insufficient documentation

## 2015-08-15 DIAGNOSIS — Z8711 Personal history of peptic ulcer disease: Secondary | ICD-10-CM | POA: Insufficient documentation

## 2015-08-15 DIAGNOSIS — M069 Rheumatoid arthritis, unspecified: Secondary | ICD-10-CM | POA: Diagnosis not present

## 2015-08-15 DIAGNOSIS — Z955 Presence of coronary angioplasty implant and graft: Secondary | ICD-10-CM | POA: Insufficient documentation

## 2015-08-15 DIAGNOSIS — E785 Hyperlipidemia, unspecified: Secondary | ICD-10-CM | POA: Diagnosis not present

## 2015-08-15 DIAGNOSIS — R600 Localized edema: Secondary | ICD-10-CM | POA: Insufficient documentation

## 2015-08-15 DIAGNOSIS — I350 Nonrheumatic aortic (valve) stenosis: Secondary | ICD-10-CM | POA: Diagnosis not present

## 2015-08-15 DIAGNOSIS — Z881 Allergy status to other antibiotic agents status: Secondary | ICD-10-CM | POA: Insufficient documentation

## 2015-08-15 DIAGNOSIS — Z8249 Family history of ischemic heart disease and other diseases of the circulatory system: Secondary | ICD-10-CM | POA: Insufficient documentation

## 2015-08-15 DIAGNOSIS — Z9989 Dependence on other enabling machines and devices: Secondary | ICD-10-CM

## 2015-08-15 DIAGNOSIS — Z87891 Personal history of nicotine dependence: Secondary | ICD-10-CM | POA: Insufficient documentation

## 2015-08-15 DIAGNOSIS — Z7982 Long term (current) use of aspirin: Secondary | ICD-10-CM | POA: Insufficient documentation

## 2015-08-15 DIAGNOSIS — R42 Dizziness and giddiness: Secondary | ICD-10-CM | POA: Diagnosis not present

## 2015-08-15 DIAGNOSIS — I2510 Atherosclerotic heart disease of native coronary artery without angina pectoris: Secondary | ICD-10-CM | POA: Diagnosis not present

## 2015-08-15 DIAGNOSIS — I110 Hypertensive heart disease with heart failure: Secondary | ICD-10-CM | POA: Diagnosis not present

## 2015-08-15 DIAGNOSIS — I10 Essential (primary) hypertension: Secondary | ICD-10-CM

## 2015-08-15 DIAGNOSIS — I252 Old myocardial infarction: Secondary | ICD-10-CM | POA: Diagnosis not present

## 2015-08-15 DIAGNOSIS — I5032 Chronic diastolic (congestive) heart failure: Secondary | ICD-10-CM | POA: Insufficient documentation

## 2015-08-15 DIAGNOSIS — J449 Chronic obstructive pulmonary disease, unspecified: Secondary | ICD-10-CM | POA: Insufficient documentation

## 2015-08-15 DIAGNOSIS — G4733 Obstructive sleep apnea (adult) (pediatric): Secondary | ICD-10-CM | POA: Insufficient documentation

## 2015-08-15 DIAGNOSIS — H259 Unspecified age-related cataract: Secondary | ICD-10-CM | POA: Diagnosis not present

## 2015-08-15 DIAGNOSIS — Z9841 Cataract extraction status, right eye: Secondary | ICD-10-CM | POA: Diagnosis not present

## 2015-08-15 DIAGNOSIS — R011 Cardiac murmur, unspecified: Secondary | ICD-10-CM | POA: Insufficient documentation

## 2015-08-15 NOTE — Patient Instructions (Signed)
Continue weighing daily and call for an overnight weight gain of > 2 pounds or a weekly weight gain of >5 pounds. 

## 2015-08-15 NOTE — Progress Notes (Signed)
Subjective:    Patient ID: Andre Hayes, male    DOB: Jun 27, 1940, 75 y.o.   MRN: [id number]  Congestive Heart Failure Presents for follow-up visit. The disease course has been stable. Associated symptoms include edema and fatigue. Pertinent negatives include no abdominal pain, chest pain, chest pressure, orthopnea, palpitations or shortness of breath. The symptoms have been stable. Past treatments include salt and fluid restriction. The treatment provided moderate relief. Compliance with prior treatments has been good. His past medical history is significant for CAD, chronic lung disease, HTN and valvular heart disease. He has one 1st degree relative with heart disease.  Hypertension This is a chronic problem. The current episode started more than 1 year ago. The problem is controlled. Associated symptoms include peripheral edema. Pertinent negatives include no chest pain, headaches, neck pain, palpitations or shortness of breath. There are no associated agents to hypertension. Risk factors for coronary artery disease include family history, male gender, obesity and dyslipidemia. Past treatments include diuretics and lifestyle changes. The current treatment provides significant improvement. There are no compliance problems.  Hypertensive end-organ damage includes CAD/MI and heart failure.  Dizziness This is a new problem. The current episode started 1 to 4 weeks ago. The problem occurs daily. The problem has been unchanged. Associated symptoms include fatigue. Pertinent negatives include no abdominal pain, chest pain, congestion, coughing, headaches, neck pain, sore throat, visual change, vomiting or weakness. The symptoms are aggravated by walking and standing. He has tried position changes for the symptoms. The treatment provided no relief.   Past Medical History  Diagnosis Date  . COPD (chronic obstructive pulmonary disease) (HCC)   . Hyperlipidemia   . Pneumonia 05/02/14    not hospitalized   . Rheumatoid arthritis (HCC)   . Pulmonary disease   . CAD (coronary artery disease)   . Congestive heart failure (HCC)     NYHA 3, EF>55%  . Duodenal ulcer   . BPH (benign prostatic hypertrophy)   . Hypertension   . Heart murmur   . Myocardial infarction (HCC)   . Sleep apnea   . Aortic stenosis   . Headache   . History of peptic ulcer disease   . Senile cataracts of both eyes   . Bronchitis   . Shortness of breath dyspnea   . CHF (congestive heart failure) (HCC)   . Murmur   . Edema, lower extremity   . Wheezing   . Orthopnea     Past Surgical History  Procedure Laterality Date  . Lung surgery    . Coronary angioplasty with stent placement      Dr. Juliann Pares  . 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 Cataract extraction w/phaco Right 06/23/2015    Procedure: CATARACT EXTRACTION PHACO AND INTRAOCULAR LENS PLACEMENT (IOC);  Surgeon: Sallee Lange, MD;  Location: [facility] ORS;  Service: Ophthalmology;  Laterality: Right;  [place] 01:36 AP% 24.5 CDE 41.30 fluid pack lot # [id number]    Family History  Problem Relation Age of Onset  . Tuberculosis Mother   . COPD Mother   . Emphysema Mother   . Heart disease Father   . Stomach cancer Father   . Breast cancer Sister   . Colon cancer Brother     Social History  Substance Use Topics  . Smoking status: Former Smoker -- 1.00  packs/day for 10 years    Types: Cigarettes    Start date: 06/17/1954    Quit date: 06/16/1964  . Smokeless tobacco: Never Used  . Alcohol Use: No    Allergies  Allergen Reactions  . Erythromycin Rash  . Erythromycin Base Rash    Prior to Admission medications   Medication Sig Start Date End Date Taking? Authorizing Provider  acetaminophen (TYLENOL) 325 MG tablet Take 650 mg by mouth every 6 (six)  hours as needed.   Yes Historical Provider, MD  albuterol (PROVENTIL HFA;VENTOLIN HFA) 108 (90 BASE) MCG/ACT inhaler Inhale 1-2 puffs into the lungs 4 (four) times daily as needed for shortness of breath. Reported on 04/14/2015   Yes Historical Provider, MD  albuterol (PROVENTIL) (2.5 MG/3ML) 0.083% nebulizer solution Take 2.5 mg by nebulization 2 (two) times daily.   Yes Historical Provider, MD  aspirin EC 81 MG tablet Take 81 mg by mouth daily. Reported on 04/14/2015   Yes Historical Provider, MD  budesonide (PULMICORT) 0.5 MG/2ML nebulizer solution Take 0.5 mg by nebulization 2 (two) times daily as needed.   Yes Historical Provider, MD  cholecalciferol (VITAMIN D) 400 units TABS tablet Take 400 Units by mouth.   Yes Historical Provider, MD  clopidogrel (PLAVIX) 75 MG tablet Take 75 mg by mouth daily.   Yes Historical Provider, MD  docusate sodium (COLACE) 100 MG capsule Take 1 capsule (100 mg total) by mouth daily. 04/15/15  Yes Shannon A McGowan, PA-C  fluticasone (FLOVENT HFA) 220 MCG/ACT inhaler Inhale 1 puff into the lungs daily.   Yes Historical Provider, MD  folic acid (FOLVITE) 400 MCG tablet Take 400 mcg by mouth daily.   Yes Historical Provider, MD  furosemide (LASIX) 20 MG tablet Take 20 mg by mouth daily.    Yes Historical Provider, MD  isosorbide dinitrate (ISORDIL) 30 MG tablet Take 30 mg by mouth daily.    Yes Historical Provider, MD  meclizine (ANTIVERT) 25 MG tablet Take 25 mg by mouth 4 (four) times daily as needed for dizziness.   Yes Historical Provider, MD  naproxen sodium (ANAPROX) 220 MG tablet Take 220 mg by mouth 2 (two) times daily as needed.   Yes Historical Provider, MD  psyllium (TGT PSYLLIUM FIBER) 0.52 g capsule Take 0.52 g by mouth daily. Reported on 06/23/2015   Yes Historical Provider, MD  simvastatin (ZOCOR) 40 MG tablet Take 40 mg by mouth daily.   Yes Historical Provider, MD  Specialty Vitamins Products (MAGNESIUM, AMINO ACID CHELATE,) 133 MG tablet Take 1 tablet by  mouth 2 (two) times daily.   Yes Historical Provider, MD  tiotropium (SPIRIVA) 18 MCG inhalation capsule Place 18 mcg into inhaler and inhale daily. 10/08/13  Yes Historical Provider, MD      Review of Systems  Constitutional: Positive for fatigue. Negative for appetite change.  HENT: Positive for sneezing. Negative for congestion, postnasal drip and sore throat.        Dry mouth   Eyes: Negative.   Respiratory: Negative for cough, chest tightness, shortness of breath and wheezing.   Cardiovascular: Positive for leg swelling. Negative for chest pain and palpitations.  Gastrointestinal: Negative for vomiting, abdominal pain and abdominal distention.  Endocrine: Negative.   Genitourinary: Negative.   Musculoskeletal: Negative for back pain and neck pain.  Skin: Negative.   Allergic/Immunologic: Negative.   Neurological: Positive for dizziness (over the last week) and light-headedness. Negative for weakness and headaches.  Hematological: Negative for adenopathy. Bruises/bleeds easily.  Psychiatric/Behavioral: Positive for  sleep disturbance (wearing CPAP nightly). Negative for dysphoric mood. The patient is not nervous/anxious.        Objective:   Physical Exam  Constitutional: He is oriented to person, place, and time. He appears well-developed and well-nourished.  HENT:  Head: Normocephalic and atraumatic.  Eyes: Conjunctivae are normal. Pupils are equal, round, and reactive to light.  Neck: Normal range of motion. Neck supple.  Cardiovascular: Normal rate and regular rhythm.   Pulmonary/Chest: Effort normal. He has no wheezes. He has no rales.  Abdominal: Soft. He exhibits no distension. There is no tenderness.  Musculoskeletal: He exhibits edema (1+ pitting edema in bilateral ankles). He exhibits no tenderness.  Neurological: He is alert and oriented to person, place, and time.  Skin: Skin is warm and dry.  Psychiatric: He has a normal mood and affect. His behavior is normal.  Thought content normal.  Nursing note and vitals reviewed.   BP 113/51 mmHg  Pulse 79  Resp 20  Ht 6' (1.829 m)  Wt 255 lb (115.667 kg)  BMI 34.58 kg/m2  SpO2 93%       Assessment & Plan:  1: Chronic heart failure with preserved ejection fraction- Patient presents with fatigue upon exertion which does improve when he rests. He denies any shortness of breath upon exertion. He does experience chronic swelling around his ankles and will take 1-2 furosemide daily. He does say that the swelling does resolve overnight and admits that he doesn't elevate his legs very much during the day and he was encouraged to do so. He continues to weigh himself daily and says that he's lost some weight. By our scales, he's lost 5 pounds since he was last here on 01/30/15. Reminded to call for an overnight weight gain of >2 pounds or a weekly weight gain of >5 pounds. He is not adding any salt to his food and tries to eat low sodium foods.  2: HTN- Blood pressure looks good and is improved from the last time he was here. 3: Obstructive sleep apnea- He says that he's now wearing his CPAP about 6 hours nightly and feels more rested since he's been wearing it.  4: Dizziness- He says that this started about one week ago after he flew to [place]. He felt his ears pop and he had some dizziness and vomiting shortly after and he's been dizzy since. He also bumped his head 2 weeks prior to this. He has had a CT done through his PCP and has just started taking meclizine yesterday. He's taken 1 dose yesterday and 1 dose this morning. Encouraged him to monitor for any drowsiness and to follow-up with his PCP next week if he remains dizzy.  Medication list was reviewed with the patient.  Return here in 6 months or sooner for any questions/problems before then.

## 2015-09-02 ENCOUNTER — Observation Stay
Admission: EM | Admit: 2015-09-02 | Discharge: 2015-09-05 | Disposition: A | Discharge status: Home / Self Care | Payer: Medicare HMO | Attending: Internal Medicine | Admitting: Internal Medicine

## 2015-09-02 ENCOUNTER — Encounter: Payer: Self-pay | Admitting: Emergency Medicine

## 2015-09-02 DIAGNOSIS — Z961 Presence of intraocular lens: Secondary | ICD-10-CM | POA: Insufficient documentation

## 2015-09-02 DIAGNOSIS — Z86010 Personal history of colonic polyps: Secondary | ICD-10-CM | POA: Insufficient documentation

## 2015-09-02 DIAGNOSIS — I5032 Chronic diastolic (congestive) heart failure: Secondary | ICD-10-CM | POA: Insufficient documentation

## 2015-09-02 DIAGNOSIS — Z955 Presence of coronary angioplasty implant and graft: Secondary | ICD-10-CM | POA: Diagnosis not present

## 2015-09-02 DIAGNOSIS — Z9841 Cataract extraction status, right eye: Secondary | ICD-10-CM | POA: Insufficient documentation

## 2015-09-02 DIAGNOSIS — Z7902 Long term (current) use of antithrombotics/antiplatelets: Secondary | ICD-10-CM | POA: Diagnosis not present

## 2015-09-02 DIAGNOSIS — Z7982 Long term (current) use of aspirin: Secondary | ICD-10-CM | POA: Insufficient documentation

## 2015-09-02 DIAGNOSIS — I272 Other secondary pulmonary hypertension: Secondary | ICD-10-CM | POA: Insufficient documentation

## 2015-09-02 DIAGNOSIS — K648 Other hemorrhoids: Secondary | ICD-10-CM | POA: Diagnosis not present

## 2015-09-02 DIAGNOSIS — M069 Rheumatoid arthritis, unspecified: Secondary | ICD-10-CM | POA: Diagnosis not present

## 2015-09-02 DIAGNOSIS — J449 Chronic obstructive pulmonary disease, unspecified: Secondary | ICD-10-CM | POA: Diagnosis not present

## 2015-09-02 DIAGNOSIS — K625 Hemorrhage of anus and rectum: Secondary | ICD-10-CM | POA: Diagnosis not present

## 2015-09-02 DIAGNOSIS — K922 Gastrointestinal hemorrhage, unspecified: Principal | ICD-10-CM | POA: Diagnosis present

## 2015-09-02 DIAGNOSIS — Z6833 Body mass index (BMI) 33.0-33.9, adult: Secondary | ICD-10-CM | POA: Insufficient documentation

## 2015-09-02 DIAGNOSIS — Z87891 Personal history of nicotine dependence: Secondary | ICD-10-CM | POA: Insufficient documentation

## 2015-09-02 DIAGNOSIS — Z8711 Personal history of peptic ulcer disease: Secondary | ICD-10-CM | POA: Insufficient documentation

## 2015-09-02 DIAGNOSIS — I350 Nonrheumatic aortic (valve) stenosis: Secondary | ICD-10-CM | POA: Diagnosis not present

## 2015-09-02 DIAGNOSIS — Z9889 Other specified postprocedural states: Secondary | ICD-10-CM | POA: Insufficient documentation

## 2015-09-02 DIAGNOSIS — Z79899 Other long term (current) drug therapy: Secondary | ICD-10-CM | POA: Insufficient documentation

## 2015-09-02 DIAGNOSIS — I252 Old myocardial infarction: Secondary | ICD-10-CM | POA: Insufficient documentation

## 2015-09-02 DIAGNOSIS — G4733 Obstructive sleep apnea (adult) (pediatric): Secondary | ICD-10-CM | POA: Insufficient documentation

## 2015-09-02 DIAGNOSIS — I110 Hypertensive heart disease with heart failure: Secondary | ICD-10-CM | POA: Diagnosis not present

## 2015-09-02 DIAGNOSIS — I2510 Atherosclerotic heart disease of native coronary artery without angina pectoris: Secondary | ICD-10-CM | POA: Insufficient documentation

## 2015-09-02 DIAGNOSIS — N400 Enlarged prostate without lower urinary tract symptoms: Secondary | ICD-10-CM | POA: Diagnosis not present

## 2015-09-02 DIAGNOSIS — E785 Hyperlipidemia, unspecified: Secondary | ICD-10-CM | POA: Diagnosis not present

## 2015-09-02 LAB — TYPE AND SCREEN
ABO/RH(D): B NEG
Antibody Screen: NEGATIVE

## 2015-09-02 LAB — COMPREHENSIVE METABOLIC PANEL
ALK PHOS: 76 U/L (ref 38–126)
ALT: 14 U/L — ABNORMAL LOW (ref 17–63)
AST: 23 U/L (ref 15–41)
Albumin: 3.8 g/dL (ref 3.5–5.0)
Anion gap: 7 (ref 5–15)
BUN: 28 mg/dL — AB (ref 6–20)
CALCIUM: 8.9 mg/dL (ref 8.9–10.3)
CO2: 26 mmol/L (ref 22–32)
CREATININE: 0.96 mg/dL (ref 0.61–1.24)
Chloride: 104 mmol/L (ref 101–111)
Glucose, Bld: 106 mg/dL — ABNORMAL HIGH (ref 65–99)
Potassium: 4.6 mmol/L (ref 3.5–5.1)
Sodium: 137 mmol/L (ref 135–145)
Total Bilirubin: 0.4 mg/dL (ref 0.3–1.2)
Total Protein: 6.9 g/dL (ref 6.5–8.1)

## 2015-09-02 LAB — CBC
HCT: 39.4 % — ABNORMAL LOW (ref 40.0–52.0)
Hemoglobin: 12.8 g/dL — ABNORMAL LOW (ref 13.0–18.0)
MCH: 24.5 pg — AB (ref 26.0–34.0)
MCHC: 32.4 g/dL (ref 32.0–36.0)
MCV: 75.6 fL — ABNORMAL LOW (ref 80.0–100.0)
PLATELETS: 194 10*3/uL (ref 150–440)
RBC: 5.21 MIL/uL (ref 4.40–5.90)
RDW: 14.5 % (ref 11.5–14.5)
WBC: 9.6 10*3/uL (ref 3.8–10.6)

## 2015-09-02 LAB — HEMOGLOBIN: Hemoglobin: 12.2 g/dL — ABNORMAL LOW (ref 13.0–18.0)

## 2015-09-02 MED ORDER — ALBUTEROL SULFATE (2.5 MG/3ML) 0.083% IN NEBU: 2.5000 mg | INHALATION_SOLUTION | Freq: Four times a day (QID) | RESPIRATORY_TRACT | Status: DC | PRN

## 2015-09-02 MED ORDER — FOLIC ACID 0.5 MG HALF TAB
500.0000 ug | ORAL_TABLET | Freq: Every day | ORAL | Status: DC
  Filled 2015-09-02: qty 1

## 2015-09-02 MED ORDER — ACETAMINOPHEN 650 MG RE SUPP: 650.0000 mg | Freq: Four times a day (QID) | RECTAL | Status: DC | PRN

## 2015-09-02 MED ORDER — ACETAMINOPHEN 325 MG PO TABS: 650.0000 mg | ORAL_TABLET | Freq: Four times a day (QID) | ORAL | Status: DC | PRN

## 2015-09-02 MED ORDER — ALBUTEROL SULFATE HFA 108 (90 BASE) MCG/ACT IN AERS
2.0000 | INHALATION_SPRAY | Freq: Four times a day (QID) | RESPIRATORY_TRACT | Status: DC | PRN
Start: 2015-09-02 — End: 2015-09-02

## 2015-09-02 MED ORDER — MAGNESIUM OXIDE 400 (241.3 MG) MG PO TABS
400.0000 mg | ORAL_TABLET | Freq: Every day | ORAL | Status: DC
  Filled 2015-09-02: qty 1

## 2015-09-02 MED ORDER — ONDANSETRON HCL 4 MG/2ML IJ SOLN: 4.0000 mg | Freq: Four times a day (QID) | INTRAMUSCULAR | Status: DC | PRN

## 2015-09-02 MED ORDER — FUROSEMIDE 40 MG PO TABS
40.0000 mg | ORAL_TABLET | Freq: Every day | ORAL | Status: DC
  Filled 2015-09-02 (×2): qty 1

## 2015-09-02 MED ORDER — SIMVASTATIN 40 MG PO TABS
80.0000 mg | ORAL_TABLET | Freq: Every day | ORAL | Status: DC
  Filled 2015-09-02: qty 2

## 2015-09-02 MED ORDER — HYDROXYCHLOROQUINE SULFATE 200 MG PO TABS
200.0000 mg | ORAL_TABLET | Freq: Every day | ORAL | Status: DC
  Filled 2015-09-02 (×3): qty 1

## 2015-09-02 MED ORDER — VITAMIN D 1000 UNITS PO TABS
1000.0000 [IU] | ORAL_TABLET | Freq: Every day | ORAL | Status: DC
  Filled 2015-09-02: qty 1

## 2015-09-02 MED ORDER — TIOTROPIUM BROMIDE MONOHYDRATE 18 MCG IN CAPS
18.0000 ug | ORAL_CAPSULE | Freq: Every day | RESPIRATORY_TRACT | Status: DC
  Filled 2015-09-02: qty 5

## 2015-09-02 MED ORDER — ONDANSETRON HCL 4 MG PO TABS: 4.0000 mg | ORAL_TABLET | Freq: Four times a day (QID) | ORAL | Status: DC | PRN

## 2015-09-02 NOTE — ED Notes (Signed)
Pt reports having diarrhea and rectal bleeding since Sunday. Pt reports rectal bleeding was reported to PCP and pt was advised to report to the ED for further evaluation.

## 2015-09-02 NOTE — H&P (Signed)
[employer] [facility] [place] [facility]   PATIENT NAME: Andre Hayes    MR#:  [id number]  DATE OF BIRTH:  1941-03-14  DATE OF ADMISSION:  09/02/2015  PRIMARY CARE PHYSICIAN: Lauro Regulus., MD   REQUESTING/REFERRING PHYSICIAN: Dr. Gladstone Pih  CHIEF COMPLAINT:   Chief Complaint  Patient presents with  . Rectal Bleeding    HISTORY OF PRESENT ILLNESS:  Andre Hayes  is a 75 y.o. male with a known history of COPD, rheumatoid arthritis, coronary artery disease status post stent, history of diastolic CHF, BPH, hypertension, hyperlipidemia, history of aortic stenosis who presents to the hospital due to multiple episodes of rectal bleeding. Patient has had 3-4 episodes of rectal bleeding since this past Sunday. He describes it with the red blood with some small amount of brown stool. He has a history of hemorrhoids but has not noticed a significant lesion noted in the past. He does not recall ever having a history of diverticulosis. He is on aspirin and Plavix, he denies any abdominal pain, nausea, vomiting, fever, chills, chest pain, worsening shortness of breath or any other associated symptoms. Given his recurrent rectal bleeding hospitalist services were contacted further treatment and evaluation.  PAST MEDICAL HISTORY:   Past Medical History  Diagnosis Date  . COPD (chronic obstructive pulmonary disease) (HCC)   . Hyperlipidemia   . Pneumonia 05/02/14    not hospitalized  . Rheumatoid arthritis (HCC)   . Pulmonary disease   . CAD (coronary artery disease)   . Congestive heart failure (HCC)     NYHA 3, EF>55%  . Duodenal ulcer   . BPH (benign prostatic hypertrophy)   . Hypertension   . Heart murmur   . Myocardial infarction (HCC)   . Sleep apnea   . Aortic stenosis   . Headache   . History of peptic ulcer disease   . Senile cataracts of both eyes   . Bronchitis   . Shortness of breath dyspnea   . CHF (congestive heart failure) (HCC)   . Murmur   .  Edema, lower extremity   . Wheezing   . Orthopnea     PAST SURGICAL HISTORY:   Past Surgical History  Procedure Laterality Date  . Lung surgery    . Coronary angioplasty with stent placement      Dr. Juliann Pares  . 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 Cataract extraction w/phaco Right 06/23/2015    Procedure: CATARACT EXTRACTION PHACO AND INTRAOCULAR LENS PLACEMENT (IOC);  Surgeon: Sallee Lange, MD;  Location: [facility] ORS;  Service: Ophthalmology;  Laterality: Right;  [place] 01:36 AP% 24.5 CDE 41.30 fluid pack lot # [id number]    SOCIAL HISTORY:   Social History  Substance Use Topics  . Smoking status: Former Smoker -- 1.00 packs/day for 10 years    Types: Cigarettes    Start date: 06/17/1954    Quit date: 06/16/1964  . Smokeless tobacco: Never Used  . Alcohol Use: No    FAMILY HISTORY:   Family History  Problem Relation Age of Onset  . Tuberculosis Mother   . COPD Mother   . Emphysema Mother   . Heart disease Father   . Stomach cancer Father   . Breast cancer Sister   . Colon cancer Brother     DRUG ALLERGIES:   Allergies  Allergen Reactions  . Erythromycin Rash    REVIEW OF SYSTEMS:   Review of Systems  Constitutional: Negative for fever and weight loss.  HENT: Negative for congestion, nosebleeds and tinnitus.   Eyes: Negative for blurred vision, double vision and redness.  Respiratory: Negative for cough, hemoptysis and shortness of breath.   Cardiovascular: Negative for chest pain, orthopnea, leg swelling and PND.  Gastrointestinal: Positive for blood in stool. Negative for nausea, vomiting, abdominal pain, diarrhea and melena.  Genitourinary: Negative for dysuria, urgency and hematuria.  Musculoskeletal: Negative for joint pain and falls.  Neurological:  Negative for dizziness, tingling, sensory change, focal weakness, seizures, weakness and headaches.  Endo/Heme/Allergies: Negative for polydipsia. Does not bruise/bleed easily.  Psychiatric/Behavioral: Negative for depression and memory loss. The patient is not nervous/anxious.     MEDICATIONS AT HOME:   Prior to Admission medications   Medication Sig Start Date End Date Taking? Authorizing Provider  acetaminophen (TYLENOL) 325 MG tablet Take 650 mg by mouth every 6 (six) hours as needed for mild pain or headache.    Yes Historical Provider, MD  albuterol (PROVENTIL HFA;VENTOLIN HFA) 108 (90 BASE) MCG/ACT inhaler Inhale 2 puffs into the lungs every 6 (six) hours as needed for wheezing or shortness of breath.    Yes Historical Provider, MD  albuterol (PROVENTIL) (2.5 MG/3ML) 0.083% nebulizer solution Take 2.5 mg by nebulization every 6 (six) hours as needed for wheezing or shortness of breath.    Yes Historical Provider, MD  aspirin EC 81 MG tablet Take 81 mg by mouth daily.    Yes Historical Provider, MD  cholecalciferol (VITAMIN D) 1000 units tablet Take 1,000 Units by mouth daily.   Yes Historical Provider, MD  clopidogrel (PLAVIX) 75 MG tablet Take 75 mg by mouth daily.   Yes Historical Provider, MD  docusate sodium (COLACE) 100 MG capsule Take 1 capsule (100 mg total) by mouth daily. 04/15/15  Yes Shannon A McGowan, PA-C  folic acid (FOLVITE) 400 MCG tablet Take 400 mcg by mouth daily.   Yes Historical Provider, MD  furosemide (LASIX) 20 MG tablet Take 40 mg by mouth daily.    Yes Historical Provider, MD  hydroxychloroquine (PLAQUENIL) 200 MG tablet Take 200 mg by mouth daily.   Yes Historical Provider, MD  Magnesium 250 MG TABS Take 250 mg by mouth daily.   Yes Historical Provider, MD  simvastatin (ZOCOR) 40 MG tablet Take 80 mg by mouth daily.    Yes Historical Provider, MD  tiotropium (SPIRIVA) 18 MCG inhalation capsule Place 18 mcg into inhaler and inhale daily.   Yes Historical Provider,  MD      VITAL SIGNS:  Blood pressure 152/88, pulse 68, temperature 98.6 F (37 C), temperature source Oral, resp. rate 16, height 6' (1.829 m), weight 113.399 kg (250 lb), SpO2 98 %.  PHYSICAL EXAMINATION:  Physical Exam  GENERAL:  75 y.o.-year-old patient lying in the bed with no acute distress.  EYES: Pupils equal, round, reactive to light and accommodation. No scleral icterus. Extraocular muscles intact.  HEENT: Head atraumatic, normocephalic. Oropharynx and nasopharynx clear. No oropharyngeal erythema, moist oral mucosa  NECK:  Supple, no jugular venous distention. No thyroid enlargement, no tenderness.  LUNGS: Normal breath sounds bilaterally, no wheezing, rales, rhonchi. No use of accessory muscles of respiration.  CARDIOVASCULAR: S1, S2 RRR. 2/6 systolic ejection murmur at the left sternal border, no rubs, gallops, clicks.  ABDOMEN: Soft, nontender, nondistended. Bowel sounds present. No organomegaly or mass.  EXTREMITIES: No pedal edema,  cyanosis, or clubbing. + 2 pedal & radial pulses b/l.   NEUROLOGIC: Cranial nerves II through XII are intact. No focal Motor or sensory deficits appreciated b/l PSYCHIATRIC: The patient is alert and oriented x 3. Good affect.  SKIN: No obvious rash, lesion, or ulcer.   LABORATORY PANEL:   CBC  Recent Labs Lab 09/02/15 1836  WBC 9.6  HGB 12.8*  HCT 39.4*  PLT 194   ------------------------------------------------------------------------------------------------------------------  Chemistries   Recent Labs Lab 09/02/15 1836  NA 137  K 4.6  CL 104  CO2 26  GLUCOSE 106*  BUN 28*  CREATININE 0.96  CALCIUM 8.9  AST 23  ALT 14*  ALKPHOS 76  BILITOT 0.4   ------------------------------------------------------------------------------------------------------------------  Cardiac Enzymes No results for input(s): TROPONINI in the last 168  hours. ------------------------------------------------------------------------------------------------------------------  RADIOLOGY:  No results found.   IMPRESSION AND PLAN:   75 year old male with past medical history of COPD, rheumatoid arthritis, history of diastolic CHF, coronary artery disease status post stent placement, aortic stenosis, history of peptic ulcer disease who presented to the hospital due to multiple episodes of rectal bleeding.  1. Lower GI bleed-patient presents with multiple episodes of rectal bleeding. Hemoglobin is currently stable. -I suspect this is likely a diverticular bleed or even hemorrhoidal. -I will hold his aspirin and Plavix, follow serial hemoglobins. -Get a gastroenterology consult. Place him on clear liquid diet  2. COPD-no acute exacerbation-continue Spiriva, albuterol inhaler as needed.  3. Hyperlipidemia-continued simvastatin.  4. Rheumatoid arthritis-continue Plaquenil.  5. History of diastolic CHF-clinically patient is not in congestive heart failure. Continue Lasix.  All the records are reviewed and case discussed with ED provider. Management plans discussed with the patient, family and they are in agreement.  CODE STATUS: Full  TOTAL TIME TAKING CARE OF THIS PATIENT: 40 minutes.    Houston Siren M.D on 09/02/2015 at 9:01 PM  Between 7am to 6pm - Pager - [phone]  After 6pm go to [email or web address] - password [email or web address]  [facility]  Office  [phone]  CC: Primary care physician; Lauro Regulus., MD

## 2015-09-02 NOTE — ED Provider Notes (Signed)
[facility] Emergency Department Provider Note   ____________________________________________  Time seen: Approximately 820 PM  I have reviewed the triage vital signs and the nursing notes.   HISTORY  Chief Complaint Rectal Bleeding  HPI Andre Hayes is a 75 y.o. male with a history of coronary artery disease on aspirin and Plavix was given emergency Department today with rectal bleeding. He said they started having bleeding on Sunday night and then had 2 episodes of bloody diarrhea today. He denies any pain. Denies any lightheadedness or dizziness. Says that he did not take his aspirin and Plavix today because of the bleeding that he saw in his stool. Says that he had a colonoscopy several years ago and does not know of any pathology found on it. No known history of diverticular disease.   Past Medical History  Diagnosis Date  . COPD (chronic obstructive pulmonary disease) (HCC)   . Hyperlipidemia   . Pneumonia 05/02/14    not hospitalized  . Rheumatoid arthritis (HCC)   . Pulmonary disease   . CAD (coronary artery disease)   . Congestive heart failure (HCC)     NYHA 3, EF>55%  . Duodenal ulcer   . BPH (benign prostatic hypertrophy)   . Hypertension   . Heart murmur   . Myocardial infarction (HCC)   . Sleep apnea   . Aortic stenosis   . Headache   . History of peptic ulcer disease   . Senile cataracts of both eyes   . Bronchitis   . Shortness of breath dyspnea   . CHF (congestive heart failure) (HCC)   . Murmur   . Edema, lower extremity   . Wheezing   . Orthopnea     Patient Active Problem List   Diagnosis Date Noted  . Dizziness 08/15/2015  . Arteriosclerosis of coronary artery 06/05/2015  . Benign essential HTN 06/05/2015  . Pulmonary hypertension (HCC) 06/05/2015  . BPH (benign prostatic hyperplasia) 04/14/2015  . Chronic diastolic heart failure (HCC) 01/31/2015  . Hypotension 01/31/2015  . COPD (chronic obstructive pulmonary  disease) with chronic bronchitis (HCC) 01/31/2015  . Breathlessness on exertion 08/05/2014  . OSA on CPAP   . Edema 08/25/2013  . Morbid obesity (HCC) 08/25/2013  . Rheumatoid arthritis (HCC) 08/25/2013  . Severe chronic obstructive pulmonary disease (HCC) 08/20/2013    Past Surgical History  Procedure Laterality Date  . Lung surgery    . Coronary angioplasty with stent placement      Dr. Juliann Pares  . 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 Cataract extraction w/phaco Right 06/23/2015    Procedure: CATARACT EXTRACTION PHACO AND INTRAOCULAR LENS PLACEMENT (IOC);  Surgeon: Sallee Lange, MD;  Location: [facility] ORS;  Service: Ophthalmology;  Laterality: Right;  [place] 01:36 AP% 24.5 CDE 41.30 fluid pack lot # [id number]    Current Outpatient Rx  Name  Route  Sig  Dispense  Refill  . acetaminophen (TYLENOL) 325 MG tablet   Oral   Take 650 mg by mouth every 6 (six) hours as needed.         Marland Kitchen albuterol (PROVENTIL HFA;VENTOLIN HFA) 108 (90 BASE) MCG/ACT inhaler   Inhalation   Inhale 1-2 puffs into the lungs 4 (four) times daily as needed for shortness of breath. Reported on 04/14/2015         .  albuterol (PROVENTIL) (2.5 MG/3ML) 0.083% nebulizer solution   Nebulization   Take 2.5 mg by nebulization 2 (two) times daily.         Marland Kitchen aspirin EC 81 MG tablet   Oral   Take 81 mg by mouth daily. Reported on 04/14/2015         . budesonide (PULMICORT) 0.5 MG/2ML nebulizer solution   Nebulization   Take 0.5 mg by nebulization 2 (two) times daily as needed.         . cholecalciferol (VITAMIN D) 400 units TABS tablet   Oral   Take 400 Units by mouth.         . clopidogrel (PLAVIX) 75 MG tablet   Oral   Take 75 mg by mouth daily.         Marland Kitchen docusate sodium (COLACE) 100 MG capsule    Oral   Take 1 capsule (100 mg total) by mouth daily.   10 capsule   0   . fluticasone (FLOVENT HFA) 220 MCG/ACT inhaler   Inhalation   Inhale 1 puff into the lungs daily.         . folic acid (FOLVITE) 400 MCG tablet   Oral   Take 400 mcg by mouth daily.         . furosemide (LASIX) 20 MG tablet   Oral   Take 20 mg by mouth daily.          . isosorbide dinitrate (ISORDIL) 30 MG tablet   Oral   Take 30 mg by mouth daily.          . meclizine (ANTIVERT) 25 MG tablet   Oral   Take 25 mg by mouth 4 (four) times daily as needed for dizziness.         . naproxen sodium (ANAPROX) 220 MG tablet   Oral   Take 220 mg by mouth 2 (two) times daily as needed.         . psyllium (TGT PSYLLIUM FIBER) 0.52 g capsule   Oral   Take 0.52 g by mouth daily. Reported on 06/23/2015         . simvastatin (ZOCOR) 40 MG tablet   Oral   Take 40 mg by mouth daily.         Marland Kitchen Specialty Vitamins Products (MAGNESIUM, AMINO ACID CHELATE,) 133 MG tablet   Oral   Take 1 tablet by mouth 2 (two) times daily.         Marland Kitchen tiotropium (SPIRIVA) 18 MCG inhalation capsule   Inhalation   Place 18 mcg into inhaler and inhale daily.           Allergies Erythromycin and Erythromycin base  Family History  Problem Relation Age of Onset  . Tuberculosis Mother   . COPD Mother   . Emphysema Mother   . Heart disease Father   . Stomach cancer Father   . Breast cancer Sister   . Colon cancer Brother     Social History Social History  Substance Use Topics  . Smoking status: Former Smoker -- 1.00 packs/day for 10 years    Types: Cigarettes    Start date: 06/17/1954    Quit date: 06/16/1964  . Smokeless tobacco: Never Used  . Alcohol Use: No    Review of Systems Constitutional: No fever/chills Eyes: No visual changes. ENT: No sore throat. Cardiovascular: Denies chest pain. Respiratory: Denies shortness of breath. Gastrointestinal: No abdominal pain.  No nausea, no vomiting.  No  diarrhea.  No constipation. Genitourinary: Negative for dysuria. Musculoskeletal: Negative for back pain. Skin: Negative for rash. Neurological: Negative for headaches, focal weakness or numbness.  10-point ROS otherwise negative.  ____________________________________________   PHYSICAL EXAM:  VITAL SIGNS: ED Triage Vitals  Enc Vitals Group     BP 09/02/15 1828 129/72 mmHg     Pulse Rate 09/02/15 1828 79     Resp 09/02/15 1828 18     Temp 09/02/15 1828 98.6 F (37 C)     Temp Source 09/02/15 1828 Oral     SpO2 09/02/15 1828 95 %     Weight 09/02/15 1828 250 lb (113.399 kg)     Height 09/02/15 1828 6' (1.829 m)     Head Cir --      Peak Flow --      Pain Score 09/02/15 1829 0     Pain Loc --      Pain Edu? --      Excl. in GC? --     Constitutional: Alert and oriented. Well appearing and in no acute distress. Eyes: Conjunctivae are normal. PERRL. EOMI. Head: Atraumatic. Nose: No congestion/rhinnorhea. Mouth/Throat: Mucous membranes are moist.   Neck: No stridor.   Cardiovascular: Normal rate, regular rhythm. Grossly normal heart sounds.   Respiratory: Normal respiratory effort.  No retractions. Lungs CTAB. Gastrointestinal: Soft and nontender. No distention. Rectal exam with hematochezia. Musculoskeletal: No lower extremity tenderness nor edema.  No joint effusions. Neurologic:  Normal speech and language. No gross focal neurologic deficits are appreciated.  Skin:  Skin is warm, dry and intact. No rash noted. Psychiatric: Mood and affect are normal. Speech and behavior are normal.  ____________________________________________   LABS (all labs ordered are listed, but only abnormal results are displayed)  Labs Reviewed  COMPREHENSIVE METABOLIC PANEL - Abnormal; Notable for the following:    Glucose, Bld 106 (*)    BUN 28 (*)    ALT 14 (*)    All other components within normal limits  CBC - Abnormal; Notable for the following:    Hemoglobin 12.8 (*)    HCT 39.4  (*)    MCV 75.6 (*)    MCH 24.5 (*)    All other components within normal limits  POC OCCULT BLOOD, ED  TYPE AND SCREEN   ____________________________________________  EKG   ____________________________________________  RADIOLOGY   ____________________________________________   PROCEDURES   ____________________________________________   INITIAL IMPRESSION / ASSESSMENT AND PLAN / ED COURSE  Pertinent labs & imaging results that were available during my care of the patient were reviewed by me and considered in my medical decision making (see chart for details).  Patient with hematochezia on aspirin and Plavix. We'll admit to the hospital for observation. Patient aware of the treatment plan and willing to comply. Signed out to Dr. Cherlynn Kaiser. ____________________________________________   FINAL CLINICAL IMPRESSION(S) / ED DIAGNOSES  Acute GI bleeding.    NEW MEDICATIONS STARTED DURING THIS VISIT:  New Prescriptions   No medications on file     Note:  This document was prepared using Dragon voice recognition software and may include unintentional dictation errors.    Myrna Blazer, MD 09/02/15 [phone]

## 2015-09-02 NOTE — ED Notes (Signed)
Patient presents to the ED with rectal bleeding since Sunday.  Patient reports having multiple episodes of diarrhea, with dark red and bright red blood.  Patient appears slightly pale.  Patient denies abdominal pain.  Patient reports history of CHF and cardiac stents.

## 2015-09-03 ENCOUNTER — Observation Stay: Payer: Medicare HMO

## 2015-09-03 LAB — BASIC METABOLIC PANEL
Anion gap: 4 — ABNORMAL LOW (ref 5–15)
BUN: 29 mg/dL — AB (ref 6–20)
CHLORIDE: 106 mmol/L (ref 101–111)
CO2: 30 mmol/L (ref 22–32)
Calcium: 8.7 mg/dL — ABNORMAL LOW (ref 8.9–10.3)
Creatinine, Ser: 0.96 mg/dL (ref 0.61–1.24)
GFR calc Af Amer: >60 mL/min (ref >60)
GFR calc non Af Amer: >60 mL/min (ref >60)
GLUCOSE: 98 mg/dL (ref 65–99)
POTASSIUM: 4.4 mmol/L (ref 3.5–5.1)
SODIUM: 140 mmol/L (ref 135–145)

## 2015-09-03 LAB — CBC
HEMATOCRIT: 34.4 % — AB (ref 40.0–52.0)
Hemoglobin: 11.2 g/dL — ABNORMAL LOW (ref 13.0–18.0)
MCH: 25 pg — ABNORMAL LOW (ref 26.0–34.0)
MCHC: 32.5 g/dL (ref 32.0–36.0)
MCV: 77 fL — AB (ref 80.0–100.0)
Platelets: 161 10*3/uL (ref 150–440)
RBC: 4.46 MIL/uL (ref 4.40–5.90)
RDW: 14.5 % (ref 11.5–14.5)
WBC: 8.1 10*3/uL (ref 3.8–10.6)

## 2015-09-03 LAB — HEMOGLOBIN
Hemoglobin: 11.3 g/dL — ABNORMAL LOW (ref 13.0–18.0)
Hemoglobin: 11.4 g/dL — ABNORMAL LOW (ref 13.0–18.0)

## 2015-09-03 MED ORDER — TECHNETIUM TC 99M-LABELED RED BLOOD CELLS IV KIT
20.0000 | PACK | Freq: Once | INTRAVENOUS | Status: AC | PRN
  Administered 2015-09-03: 18:00:00 22.617 via INTRAVENOUS

## 2015-09-03 MED ORDER — MAGNESIUM OXIDE 400 (241.3 MG) MG PO TABS
200.0000 mg | ORAL_TABLET | Freq: Two times a day (BID) | ORAL | Status: DC
  Administered 2015-09-03: 200 mg via ORAL
  Filled 2015-09-03 (×2): qty 1

## 2015-09-03 MED ORDER — ATORVASTATIN CALCIUM 20 MG PO TABS
40.0000 mg | ORAL_TABLET | Freq: Every day | ORAL | Status: DC
  Filled 2015-09-03: qty 2

## 2015-09-03 NOTE — Progress Notes (Signed)
Patient alert and oriented, Patient without signs of distress, telemetry had called and states that he does go in and out of trigeminy with PVC's with some pauses, nurse did let Dr. Elpidio Anis know and He states to monitor and that He would look into that along with orders for His medications, patient refused His morning medications due to possibility that He would have to pay out of pocket for medications. Patient remains observation and will be monitored, assessed and reevaluate by GI doctor also. Patient denies pain, and is pleasant, Patient is encouraged to call for assist if anything needed or any concerns.

## 2015-09-03 NOTE — Progress Notes (Signed)
[facility] - [place] [facility]   PATIENT NAME: Andre Hayes    MR#:  [id number]  DATE OF BIRTH:  12-30-1940  SUBJECTIVE:  CHIEF COMPLAINT:   Chief Complaint  Patient presents with  . Rectal Bleeding   Continues to have blood in stool. No abdominal pain. No vomiting. Afebrile. Aspirin and Plavix stopped.   REVIEW OF SYSTEMS:    Review of Systems  Constitutional: Negative for fever and chills.  HENT: Negative for sore throat.   Eyes: Negative for blurred vision, double vision and pain.  Respiratory: Negative for cough, hemoptysis, shortness of breath and wheezing.   Cardiovascular: Negative for chest pain, palpitations, orthopnea and leg swelling.  Gastrointestinal: Positive for blood in stool. Negative for heartburn, nausea, vomiting, abdominal pain, diarrhea and constipation.  Genitourinary: Negative for dysuria and hematuria.  Musculoskeletal: Negative for back pain and joint pain.  Skin: Negative for rash.  Neurological: Negative for sensory change, speech change, focal weakness and headaches.  Endo/Heme/Allergies: Does not bruise/bleed easily.  Psychiatric/Behavioral: Negative for depression. The patient is not nervous/anxious.     DRUG ALLERGIES:   Allergies  Allergen Reactions  . Erythromycin Rash    VITALS:  Blood pressure 111/55, pulse 69, temperature 98.2 F (36.8 C), temperature source Oral, resp. rate 26, height 6' (1.829 m), weight 113.399 kg (250 lb), SpO2 97 %.  PHYSICAL EXAMINATION:   Physical Exam  GENERAL:  75 y.o.-year-old patient lying in the bed with no acute distress.  EYES: Pupils equal, round, reactive to light and accommodation. No scleral icterus. Extraocular muscles intact.  HEENT: Head atraumatic, normocephalic. Oropharynx and nasopharynx clear.  NECK:  Supple, no jugular venous distention. No thyroid enlargement, no tenderness.  LUNGS: Normal breath sounds bilaterally, no wheezing, rales, rhonchi. No use of  accessory muscles of respiration.  CARDIOVASCULAR: S1, S2 normal. No murmurs, rubs, or gallops.  ABDOMEN: Soft, nontender, nondistended. Bowel sounds present. No organomegaly or mass.  EXTREMITIES: No cyanosis, clubbing or edema b/l.    NEUROLOGIC: Cranial nerves II through XII are intact. No focal Motor or sensory deficits b/l.   PSYCHIATRIC: The patient is alert and oriented x 3.  SKIN: No obvious rash, lesion, or ulcer.   LABORATORY PANEL:   CBC  Recent Labs Lab 09/03/15 0432 09/03/15 1011  WBC 8.1  --   HGB 11.2* 11.3*  HCT 34.4*  --   PLT 161  --    ------------------------------------------------------------------------------------------------------------------ Chemistries   Recent Labs Lab 09/02/15 1836 09/03/15 0432  NA 137 140  K 4.6 4.4  CL 104 106  CO2 26 30  GLUCOSE 106* 98  BUN 28* 29*  CREATININE 0.96 0.96  CALCIUM 8.9 8.7*  AST 23  --   ALT 14*  --   ALKPHOS 76  --   BILITOT 0.4  --    ------------------------------------------------------------------------------------------------------------------  Cardiac Enzymes No results for input(s): TROPONINI in the last 168 hours. ------------------------------------------------------------------------------------------------------------------  RADIOLOGY:  No results found.   ASSESSMENT AND PLAN:   * Lower GI bleed - likely diverticular bleed No abd pain/discomfort No vomiting Continues to have blood in stool Aspirin and Plavix stopped. Hemoglobin stable. No hypotension or tachycardia. GI consulted. Repeat hemoglobin later in the evening and tomorrow morning  * CAD is stable. Patient had PCI to RCA in 2011.  * Chronic diastolic CHF is stable  * DVT prophylaxis with SCDs  All the records are reviewed and case discussed with Care Management/Social Workerr. Management plans discussed with the patient, family  and they are in agreement.  TOTAL TIME TAKING CARE OF THIS PATIENT: 35 minutes.    POSSIBLE D/C IN 1-2 DAYS, DEPENDING ON CLINICAL CONDITION.  Milagros Loll R M.D on 09/03/2015 at 3:03 PM  Between 7am to 6pm - Pager - [phone]  After 6pm go to [email or web address] - password [email or web address]  [facility]  Office  [phone]  CC: Primary care physician; Lauro Regulus., MD  Note: This dictation was prepared with Dragon dictation along with smaller phrase technology. Any transcriptional errors that result from this process are unintentional.

## 2015-09-03 NOTE — Care Management Obs Status (Signed)
MEDICARE OBSERVATION STATUS NOTIFICATION   Patient Details  Name: Andre Hayes MRN: [id number] Date of Birth: 1940-11-07   Medicare Observation Status Notification Given:  Yes    Gwenette Greet, RN 09/03/2015, 1:10 PM

## 2015-09-03 NOTE — Consult Note (Signed)
GI Inpatient Consult Note  Reason for Consult: Lower GI Bleed   Attending Requesting Consult: Dr. Cherlynn Kaiser   History of Present Illness: Andre Hayes is a 75 y.o. male seen for evaluation of lower GI bleed at the request of Dr. Cherlynn Kaiser. PMHx of COPD, CAD (on plavix), CHF, BPH, HTN. Seen for evaluation of GI Bleeding.   Reports eating large meal about 4 days ago and developing fatigue and feeling poor - in middle of night awoke and had BRB on bedsheets. Had an additional stool w/ BRB 2-3x that day. No abdominal pain with the bleeding. Next day continued to have 3-4 BM/day with blood in the stool. Called PCP who reccommended he come to ED. He reports usually BRB filling toliet bowl but some BM have appeared to have a maroon color instead of BRB. Usually BM are daily, occasional QOD and uses stool softeners and miralax PRN. He has had hemorrhoid bleeding in past in small amounts but never large volume rectal bleeding.  He denies any chronic GI issues - no nausea, vomiting, epigastric pain, GERD symptoms. Has been on Plavix since a stent was placed 3 years ago, clogged and had additional cath 2 years ago. Last dose of Plavix was 08/31/15. Weight intentionally down #8lbs over past year, hoping to loose weight to improve COPD symptoms. Denies additional OTC NSAID use.   He has had 2 bloodly stools so far today. Last approximately 11am.   Last Colonoscopy: (done for personal hx of colon polyps) - 09/2012 - internal hemorrhoids, diverticulosis sigmoid & descending. Otherwise normal.  Last Endoscopy: None on file.     Past Medical History:  Past Medical History  Diagnosis Date  . COPD (chronic obstructive pulmonary disease) (HCC)   . Hyperlipidemia   . Pneumonia 05/02/14    not hospitalized  . Rheumatoid arthritis (HCC)   . Pulmonary disease   . CAD (coronary artery disease)   . Congestive heart failure (HCC)     NYHA 3, EF>55%  . Duodenal ulcer   . BPH (benign prostatic hypertrophy)   .  Hypertension   . Heart murmur   . Myocardial infarction (HCC)   . Sleep apnea   . Aortic stenosis   . Headache   . History of peptic ulcer disease   . Senile cataracts of both eyes   . Bronchitis   . Shortness of breath dyspnea   . CHF (congestive heart failure) (HCC)   . Murmur   . Edema, lower extremity   . Wheezing   . Orthopnea     Problem List: Patient Active Problem List   Diagnosis Date Noted  . GI bleed 09/02/2015  . Dizziness 08/15/2015  . Arteriosclerosis of coronary artery 06/05/2015  . Benign essential HTN 06/05/2015  . Pulmonary hypertension (HCC) 06/05/2015  . BPH (benign prostatic hyperplasia) 04/14/2015  . Chronic diastolic heart failure (HCC) 01/31/2015  . Hypotension 01/31/2015  . COPD (chronic obstructive pulmonary disease) with chronic bronchitis (HCC) 01/31/2015  . Breathlessness on exertion 08/05/2014  . OSA on CPAP   . Edema 08/25/2013  . Morbid obesity (HCC) 08/25/2013  . Rheumatoid arthritis (HCC) 08/25/2013  . Severe chronic obstructive pulmonary disease (HCC) 08/20/2013    Past Surgical History: Past Surgical History  Procedure Laterality Date  . Lung surgery    . Coronary angioplasty with stent placement      Dr. Juliann Pares  . 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 Cataract extraction w/phaco Right 06/23/2015    Procedure: CATARACT EXTRACTION PHACO AND INTRAOCULAR LENS PLACEMENT (IOC);  Surgeon: Sallee Lange, MD;  Location: [facility] ORS;  Service: Ophthalmology;  Laterality: Right;  [place] 01:36 AP% 24.5 CDE 41.30 fluid pack lot # [id number]    Allergies: Allergies  Allergen Reactions  . Erythromycin Rash    Home Medications: Prescriptions prior to admission  Medication Sig Dispense Refill Last Dose  . acetaminophen (TYLENOL) 325 MG tablet Take  650 mg by mouth every 6 (six) hours as needed for mild pain or headache.    Past Month at Unknown time  . albuterol (PROVENTIL HFA;VENTOLIN HFA) 108 (90 BASE) MCG/ACT inhaler Inhale 2 puffs into the lungs every 6 (six) hours as needed for wheezing or shortness of breath.    Past Month at Unknown time  . albuterol (PROVENTIL) (2.5 MG/3ML) 0.083% nebulizer solution Take 2.5 mg by nebulization every 6 (six) hours as needed for wheezing or shortness of breath.    Past Month at Unknown time  . aspirin EC 81 MG tablet Take 81 mg by mouth daily.    09/01/2015 at Unsure   . cholecalciferol (VITAMIN D) 1000 units tablet Take 1,000 Units by mouth daily.   09/01/2015 at Unknown time  . clopidogrel (PLAVIX) 75 MG tablet Take 75 mg by mouth daily.   09/01/2015 at Unsure   . docusate sodium (COLACE) 100 MG capsule Take 1 capsule (100 mg total) by mouth daily. 10 capsule 0 09/01/2015 at Unknown time  . fluticasone (FLOVENT HFA) 110 MCG/ACT inhaler Inhale 1 puff into the lungs 2 (two) times daily.   09/01/2015 at Unknown time  . folic acid (FOLVITE) 400 MCG tablet Take 400 mcg by mouth daily.   09/01/2015 at Unknown time  . furosemide (LASIX) 20 MG tablet Take 40 mg by mouth daily.    09/01/2015 at Unknown time  . hydroxychloroquine (PLAQUENIL) 200 MG tablet Take 200 mg by mouth daily.   09/01/2015 at Unknown time  . Magnesium 250 MG TABS Take 250 mg by mouth daily.   09/01/2015 at Unknown time  . simvastatin (ZOCOR) 40 MG tablet Take 80 mg by mouth daily.    09/01/2015 at Unknown time  . tiotropium (SPIRIVA) 18 MCG inhalation capsule Place 18 mcg into inhaler and inhale daily.   09/01/2015 at Unknown time   Home medication reconciliation was completed with the patient.   Scheduled Inpatient Medications:   . cholecalciferol  1,000 Units Oral Daily  . folic acid  500 mcg Oral Daily  . furosemide  40 mg Oral Daily  . hydroxychloroquine  200 mg Oral Daily  . magnesium oxide  400 mg Oral Daily  . simvastatin  80 mg Oral  Daily  . tiotropium  18 mcg Inhalation Daily    Continuous Inpatient Infusions:     PRN Inpatient Medications:  acetaminophen **OR** acetaminophen, albuterol, ondansetron **OR** ondansetron (ZOFRAN) IV  Family History: family history includes Breast cancer in his sister; COPD in his mother; Colon cancer in his brother; Emphysema in his mother; Heart disease in his father; Stomach cancer in his father; Tuberculosis in his mother.  .  Social History:   reports that he quit smoking about 51 years ago. His smoking use included Cigarettes. He started smoking about 61 years ago. He has a 10 pack-year smoking history. He has never used smokeless tobacco. He reports that he  does not drink alcohol or use illicit drugs.   Review of Systems: Constitutional: Weight is stable.  Eyes: No changes in vision. ENT: No oral lesions, sore throat.  GI: see HPI.  Heme/Lymph: No easy bruising.  CV: No chest pain.  GU: No hematuria.  Integumentary: No rashes.  Neuro: No headaches.  Psych: No depression/anxiety.  Endocrine: No heat/cold intolerance.  Allergic/Immunologic: No urticaria.  Resp: No cough, SOB.  Musculoskeletal: No joint swelling.    Physical Examination: BP 111/55 mmHg  Pulse 69  Temp(Src) 98.2 F (36.8 C) (Oral)  Resp 26  Ht 6' (1.829 m)  Wt 113.399 kg (250 lb)  BMI 33.90 kg/m2  SpO2 97% Gen: NAD, alert and oriented x 4 HEENT: PEERLA, EOMI, Neck: supple, no JVD or thyromegaly Chest: CTA bilaterally, no wheezes, crackles, or other adventitious sounds CV: III/VI systolic murmur, regular rhythm  Abd: soft, NT, ND, +BS in all four quadrants; hyperactive bowel sounds.  Rectal- small external hemorrhoids, non thrombosed, BRB with maroon clots in rectal vault.  Ext: no edema, well perfused with 2+ pulses, Skin: no rash or lesions noted Lymph: no LAD  Data: Lab Results  Component Value Date   WBC 8.1 09/03/2015   HGB 11.3* 09/03/2015   HCT 34.4* 09/03/2015   MCV 77.0*  09/03/2015   PLT 161 09/03/2015    Recent Labs Lab 09/02/15 2242 09/03/15 0432 09/03/15 1011  HGB 12.2* 11.2* 11.3*   Lab Results  Component Value Date   NA 140 09/03/2015   K 4.4 09/03/2015   CL 106 09/03/2015   CO2 30 09/03/2015   BUN 29* 09/03/2015   CREATININE 0.96 09/03/2015   Lab Results  Component Value Date   ALT 14* 09/02/2015   AST 23 09/02/2015   ALKPHOS 76 09/02/2015   BILITOT 0.4 09/02/2015   No results for input(s): APTT, INR, PTT in the last 168 hours. Assessment/Plan: Mr. Geer is a 75 y.o. male admitted for rectal bleeding.   1. Rectal bleeding - Hgb stable (down 1.5 gram since admission). Chronically low MCV. Last colonoscopy 2014 w/ diverticulosis. Most likely anal outlet vs. diverticular bleeding, exacerbated by his Plavix therapy. Reports bleeding unchanged in amount x 2-3 days. Will proceed w/ bleeding scan to help w/ localization. Continue monitoring H/H which has been stable.    Recommendations:  1. GI Blood Loss Scan 2. Monitor H/H   3. Continue clear liquid diet.  4. Continue holding plavix.   Case discussed w/ Dr. Marva Panda.    Thank you for the consult. Please call with questions or concerns.  Bluford Kaufmann, PA-C [facility]

## 2015-09-03 NOTE — Care Management (Signed)
Admitted to [facility] under observation status with the diagnosis of GI bleed. Lives alone.son is Jonathin [phone]). Last seen Dr. Dareen Piano 3 weeks ago. Skilled nursing facility about a year ago x 10 days.  Can't remember name of facility. Did have home health post skilled nursing.Unsure as to what the agency's name. Uses no aids for ambulation. Takes care of all basic and instrumental activities of daily living himself, drives. Good appetite. No falls. Prescriptions are filled at [facility]. Friend will transport. Gwenette Greet RN MSN CCM Care Management

## 2015-09-04 LAB — HEMOGLOBIN
Hemoglobin: 10.4 g/dL — ABNORMAL LOW (ref 13.0–18.0)
Hemoglobin: 11 g/dL — ABNORMAL LOW (ref 13.0–18.0)

## 2015-09-04 MED ORDER — HYDROCORTISONE ACE-PRAMOXINE 2.5-1 % RE CREA
TOPICAL_CREAM | Freq: Three times a day (TID) | RECTAL | Status: DC
Start: 2015-09-04 — End: 2015-09-05
  Administered 2015-09-04 – 2015-09-05 (×2): via RECTAL
  Filled 2015-09-04: qty 30

## 2015-09-04 MED ORDER — HYDROCORTISONE ACE-PRAMOXINE 1-1 % RE CREA
TOPICAL_CREAM | Freq: Three times a day (TID) | RECTAL | Status: DC
  Filled 2015-09-04: qty 30

## 2015-09-04 NOTE — Consult Note (Signed)
Subjective: Patient seen for rectal bleeding.  Stool now brown, minimal blood on TP after bm.  No abdominal pain.    Objective: Vital signs in last 24 hours: Temp:  [97.6 F (36.4 C)-98.4 F (36.9 C)] 98 F (36.7 C) (06/22 1548) Pulse Rate:  [62-74] 64 (06/22 1548) Resp:  [20] 20 (06/22 1548) BP: (102-118)/(47-58) 118/58 mmHg (06/22 1548) SpO2:  [95 %-96 %] 95 % (06/22 1548) Blood pressure 118/58, pulse 64, temperature 98 F (36.7 C), temperature source Oral, resp. rate 20, height 6' (1.829 m), weight 113.399 kg (250 lb), SpO2 95 %.   Intake/Output from previous day: 06/21 0701 - 06/22 0700 In: 1440 [P.O.:1440] Out: -   Intake/Output this shift:     General appearance:  81 male no distress.  Resp:  cta Cardio:  rrr GI:  Soft nontender, nondistended, bs positive.  Extremities:  No cce   Lab Results: Results for orders placed or performed during the hospital encounter of 09/02/15 (from the past 24 hour(s))  Hemoglobin     Status: Abnormal   Collection Time: 09/03/15  9:19 PM  Result Value Ref Range   Hemoglobin 11.4 (L) 13.0 - 18.0 g/dL  Hemoglobin     Status: Abnormal   Collection Time: 09/04/15  6:14 AM  Result Value Ref Range   Hemoglobin 10.4 (L) 13.0 - 18.0 g/dL  Hemoglobin     Status: Abnormal   Collection Time: 09/04/15  5:21 PM  Result Value Ref Range   Hemoglobin 11.0 (L) 13.0 - 18.0 g/dL      Recent Labs  40/08/67 1836  09/03/15 0432  09/03/15 2119 09/04/15 0614 09/04/15 1721  WBC 9.6  --  8.1  --   --   --   --   HGB 12.8*  < > 11.2*  < > 11.4* 10.4* 11.0*  HCT 39.4*  --  34.4*  --   --   --   --   PLT 194  --  161  --   --   --   --   < > = values in this interval not displayed. BMET  Recent Labs  09/02/15 1836 09/03/15 0432  NA 137 140  K 4.6 4.4  CL 104 106  CO2 26 30  GLUCOSE 106* 98  BUN 28* 29*  CREATININE 0.96 0.96  CALCIUM 8.9 8.7*   LFT  Recent Labs  09/02/15 1836  PROT 6.9  ALBUMIN 3.8  AST 23  ALT 14*  ALKPHOS 76   BILITOT 0.4   PT/INR No results for input(s): LABPROT, INR in the last 72 hours. Hepatitis Panel No results for input(s): HEPBSAG, HCVAB, HEPAIGM, HEPBIGM in the last 72 hours. C-Diff No results for input(s): CDIFFTOX in the last 72 hours. No results for input(s): CDIFFPCR in the last 72 hours.   Studies/Results: Nm Gi Blood Loss  09/03/2015  CLINICAL DATA:  Pt states he has been bleeding from the rectum on/off since Saturday and has lost a lot of blood. Blood + diarrhea when it started then blood + small pieces of stool. Sometimes it's bright red and sometimes it's darker red.He was admitted again yesterday and his last bleeding was at 6 a.m. today.Pt is not on oxygen and is talking/interacting. EXAM: NUCLEAR MEDICINE GASTROINTESTINAL BLEEDING SCAN TECHNIQUE: Sequential abdominal images were obtained following intravenous administration of Tc-49m labeled red blood cells. RADIOPHARMACEUTICALS:  22.617 mCi Tc-43m in-vitro labeled red cells. COMPARISON:  None. FINDINGS: There is no abnormal radiotracer localization within the abdomen pelvis  to suggest a GI bleeding source. IMPRESSION: No evidence of a GI bleeding source. Electronically Signed   By: Amie Portland M.D.   On: 09/03/2015 20:54    Scheduled Inpatient Medications:   . atorvastatin  40 mg Oral q1800  . furosemide  40 mg Oral Daily  . hydroxychloroquine  200 mg Oral Daily  . magnesium oxide  200 mg Oral BID  . tiotropium  18 mcg Inhalation Daily    Continuous Inpatient Infusions:     PRN Inpatient Medications:  acetaminophen **OR** acetaminophen, albuterol, ondansetron **OR** ondansetron (ZOFRAN) IV  Miscellaneous:   Assessment:  1) hematochexia.  Bleeding scan negative last noght, no fresh bleeding today.  Likely bleeding hemorrhoid, noted externally and likely internal.  This exacerbated by anticoagulation treatment.   Plan:  1) full liquids tonight, advance to low residue tomorrow am.   2) analpram  hc 2.5 % rectal  cream tid pr for 10 days. Would hold anticoagulation until this is done (Will need to check with Dr Gwen Pounds his o/p card. ). Recommend flex sig when possible, will not be able to do tomorrow due to scheduling.   Could arrange as o/p this week.   Christena Deem MD 09/04/2015, 6:29 PM

## 2015-09-04 NOTE — Progress Notes (Signed)
[facility] - [place] at [facility]   PATIENT NAME: Barrington Worley    MR#:  [id number]  DATE OF BIRTH:  1940/08/25  SUBJECTIVE:  CHIEF COMPLAINT:   Chief Complaint  Patient presents with  . Rectal Bleeding   Had bloody stool in morning. Less bloody today. No abd pain. Afebrile.  REVIEW OF SYSTEMS:    Review of Systems  Constitutional: Negative for fever and chills.  HENT: Negative for sore throat.   Eyes: Negative for blurred vision, double vision and pain.  Respiratory: Negative for cough, hemoptysis, shortness of breath and wheezing.   Cardiovascular: Negative for chest pain, palpitations, orthopnea and leg swelling.  Gastrointestinal: Positive for blood in stool. Negative for heartburn, nausea, vomiting, abdominal pain, diarrhea and constipation.  Genitourinary: Negative for dysuria and hematuria.  Musculoskeletal: Negative for back pain and joint pain.  Skin: Negative for rash.  Neurological: Negative for sensory change, speech change, focal weakness and headaches.  Endo/Heme/Allergies: Does not bruise/bleed easily.  Psychiatric/Behavioral: Negative for depression. The patient is not nervous/anxious.     DRUG ALLERGIES:   Allergies  Allergen Reactions  . Erythromycin Rash    VITALS:  Blood pressure 118/58, pulse 64, temperature 98 F (36.7 C), temperature source Oral, resp. rate 20, height 6' (1.829 m), weight 113.399 kg (250 lb), SpO2 95 %.  PHYSICAL EXAMINATION:   Physical Exam  GENERAL:  75 y.o.-year-old patient lying in the bed with no acute distress.  EYES: Pupils equal, round, reactive to light and accommodation. No scleral icterus. Extraocular muscles intact.  HEENT: Head atraumatic, normocephalic. Oropharynx and nasopharynx clear.  NECK:  Supple, no jugular venous distention. No thyroid enlargement, no tenderness.  LUNGS: Normal breath sounds bilaterally, no wheezing, rales, rhonchi. No use of accessory muscles of respiration.   CARDIOVASCULAR: S1, S2 normal. No murmurs, rubs, or gallops.  ABDOMEN: Soft, nontender, nondistended. Bowel sounds present. No organomegaly or mass.  EXTREMITIES: No cyanosis, clubbing or edema b/l.    NEUROLOGIC: Cranial nerves II through XII are intact. No focal Motor or sensory deficits b/l.   PSYCHIATRIC: The patient is alert and oriented x 3.  SKIN: No obvious rash, lesion, or ulcer.   LABORATORY PANEL:   CBC  Recent Labs Lab 09/03/15 0432  09/04/15 1721  WBC 8.1  --   --   HGB 11.2*  < > 11.0*  HCT 34.4*  --   --   PLT 161  --   --   < > = values in this interval not displayed. ------------------------------------------------------------------------------------------------------------------ Chemistries   Recent Labs Lab 09/02/15 1836 09/03/15 0432  NA 137 140  K 4.6 4.4  CL 104 106  CO2 26 30  GLUCOSE 106* 98  BUN 28* 29*  CREATININE 0.96 0.96  CALCIUM 8.9 8.7*  AST 23  --   ALT 14*  --   ALKPHOS 76  --   BILITOT 0.4  --    ------------------------------------------------------------------------------------------------------------------  Cardiac Enzymes No results for input(s): TROPONINI in the last 168 hours. ------------------------------------------------------------------------------------------------------------------  RADIOLOGY:  Nm Gi Blood Loss  09/03/2015  CLINICAL DATA:  Pt states he has been bleeding from the rectum on/off since Saturday and has lost a lot of blood. Blood + diarrhea when it started then blood + small pieces of stool. Sometimes it's bright red and sometimes it's darker red.He was admitted again yesterday and his last bleeding was at 6 a.m. today.Pt is not on oxygen and is talking/interacting. EXAM: NUCLEAR MEDICINE GASTROINTESTINAL BLEEDING SCAN TECHNIQUE: Sequential  abdominal images were obtained following intravenous administration of Tc-60m labeled red blood cells. RADIOPHARMACEUTICALS:  22.617 mCi Tc-70m in-vitro labeled red  cells. COMPARISON:  None. FINDINGS: There is no abnormal radiotracer localization within the abdomen pelvis to suggest a GI bleeding source. IMPRESSION: No evidence of a GI bleeding source. Electronically Signed   By: Amie Portland M.D.   On: 09/03/2015 20:54     ASSESSMENT AND PLAN:   * Lower GI bleed - likely diverticular bleed No abd pain/discomfort. Bleeding scan negative. No vomiting Continues to have blood in stool but improved Aspirin and Plavix stopped. Hemoglobin stable after mild drop. No hypotension or tachycardia. GI consulted and appreciate heelp Repeat hemoglobin later in the evening and tomorrow morning. D/C in AM if Hb stable and no further bleeding  * CAD is stable. Patient had PCI to RCA in 2011.  * Chronic diastolic CHF is stable  * DVT prophylaxis with SCDs  All the records are reviewed and case discussed with Care Management/Social Workerr. Management plans discussed with the patient and  in agreement.  TOTAL TIME TAKING CARE OF THIS PATIENT: 35 minutes.   POSSIBLE D/C IN 1-2 DAYS, DEPENDING ON CLINICAL CONDITION.  Milagros Loll R M.D on 09/04/2015 at 6:18 PM  Between 7am to 6pm - Pager - [phone]  After 6pm go to [email or web address] - password [email or web address]  [facility]  Office  [phone]  CC: Primary care physician; Lauro Regulus., MD  Note: This dictation was prepared with Dragon dictation along with smaller phrase technology. Any transcriptional errors that result from this process are unintentional.

## 2015-09-05 LAB — HEMOGLOBIN: HEMOGLOBIN: 10.7 g/dL — AB (ref 13.0–18.0)

## 2015-09-05 MED ORDER — HYDROCORTISONE 1 % EX CREA: 1.0000 "application " | TOPICAL_CREAM | Freq: Two times a day (BID) | CUTANEOUS | Status: AC

## 2015-09-05 MED ORDER — HYDROCORTISONE ACE-PRAMOXINE 2.5-1 % RE CREA: TOPICAL_CREAM | Freq: Three times a day (TID) | RECTAL | Status: DC

## 2015-09-05 NOTE — Discharge Instructions (Addendum)
°  DIET:  Regular diet  DISCHARGE CONDITION:  Stable  ACTIVITY:  Activity as tolerated  OXYGEN:  Home Oxygen: No.   Oxygen Delivery: room air  DISCHARGE LOCATION:  home   If you experience worsening of your admission symptoms, develop shortness of breath, life threatening emergency, suicidal or homicidal thoughts you must seek medical attention immediately by calling 911 or calling your MD immediately  if symptoms less severe.  You Must read complete instructions/literature along with all the possible adverse reactions/side effects for all the Medicines you take and that have been prescribed to you. Take any new Medicines after you have completely understood and accpet all the possible adverse reactions/side effects.   Please note  You were cared for by a hospitalist during your hospital stay. If you have any questions about your discharge medications or the care you received while you were in the hospital after you are discharged, you can call the unit and asked to speak with the hospitalist on call if the hospitalist that took care of you is not available. Once you are discharged, your primary care physician will handle any further medical issues. Please note that NO REFILLS for any discharge medications will be authorized once you are discharged, as it is imperative that you return to your primary care physician (or establish a relationship with a primary care physician if you do not have one) for your aftercare needs so that they can reassess your need for medications and monitor your lab values.   You can restart Aspirin and Plavix on 09/09/2015 once bleeding has stopped.

## 2015-09-05 NOTE — Progress Notes (Signed)
Pt d/ced home.  Has had no BMs today.  Small one w/small amt of blood yesterday.  Dr. Marva Panda visualized external hemorrhoids and anticipates internal ones and b/c he was on coags thinks that was the source of bleed.  Pt has rectal cream w/cortisone for inflammation.  He may possibly have flex sig outpat.  IV removed. D/c Paperwork and f/u appts reviewed.

## 2015-09-09 NOTE — Discharge Summary (Signed)
[facility] - [place] at [facility]   PATIENT NAME: Andre Hayes    MR#:  [id number]  DATE OF BIRTH:  1940-07-20  DATE OF ADMISSION:  09/02/2015 ADMITTING PHYSICIAN: Houston Siren, MD  DATE OF DISCHARGE: 09/05/2015  3:06 PM  PRIMARY CARE PHYSICIAN: Lauro Regulus., MD   ADMISSION DIAGNOSIS:  Acute GI bleeding [K92.2]  DISCHARGE DIAGNOSIS:  Active Problems:   GI bleed   SECONDARY DIAGNOSIS:   Past Medical History  Diagnosis Date  . COPD (chronic obstructive pulmonary disease) (HCC)   . Hyperlipidemia   . Pneumonia 05/02/14    not hospitalized  . Rheumatoid arthritis (HCC)   . Pulmonary disease   . CAD (coronary artery disease)   . Congestive heart failure (HCC)     NYHA 3, EF>55%  . Duodenal ulcer   . BPH (benign prostatic hypertrophy)   . Hypertension   . Heart murmur   . Myocardial infarction (HCC)   . Sleep apnea   . Aortic stenosis   . Headache   . History of peptic ulcer disease   . Senile cataracts of both eyes   . Bronchitis   . Shortness of breath dyspnea   . CHF (congestive heart failure) (HCC)   . Murmur   . Edema, lower extremity   . Wheezing   . Orthopnea      ADMITTING HISTORY  Andre Hayes is a 75 y.o. male with a known history of COPD, rheumatoid arthritis, coronary artery disease status post stent, history of diastolic CHF, BPH, hypertension, hyperlipidemia, history of aortic stenosis who presents to the hospital due to multiple episodes of rectal bleeding. Patient has had 3-4 episodes of rectal bleeding since this past Sunday. He describes it with the red blood with some small amount of brown stool. He has a history of hemorrhoids but has not noticed a significant lesion noted in the past. He does not recall ever having a history of diverticulosis. He is on aspirin and Plavix, he denies any abdominal pain, nausea, vomiting, fever, chills, chest pain, worsening shortness of breath or any other associated symptoms.  Given his recurrent rectal bleeding hospitalist services were contacted further treatment and evaluation.  HOSPITAL COURSE:   * Lower GI bleed - likely Hemorrhoidal or diverticular bleed No abd pain/discomfort. Nuclear Bleeding scan negative. No vomiting Aspirin and Plavix stopped. Hemoglobin stable after mild drop. No hypotension or tachycardia. GI consulted and seen by Dr. Filbert Schilder. Recommended hydrocortisone rectal cream for 10 days. Patient has been instructed to hold his aspirin and Plavix for 3 more days and restart once bleeding has stopped.  * CAD is stable. Patient had PCI to RCA in 2011.  * Chronic diastolic CHF is stable  * DVT prophylaxis with SCDs In the hospital  Stable for discharge home to follow-up with his primary care physician in one week and GI in 1 week.  CONSULTS OBTAINED:     DRUG ALLERGIES:   Allergies  Allergen Reactions  . Erythromycin Rash    DISCHARGE MEDICATIONS:   Discharge Medication List as of 09/05/2015  2:40 PM    START taking these medications   Details  hydrocortisone cream (PREPARATION H HYDROCORTISONE) 1 % Apply 1 application topically 2 (two) times daily., Starting 09/05/2015, Until Sat 09/13/15, Print      CONTINUE these medications which have NOT CHANGED   Details  acetaminophen (TYLENOL) 325 MG tablet Take 650 mg by mouth every 6 (six) hours as needed for mild pain or headache. ,  Until Discontinued, Historical Med    albuterol (PROVENTIL HFA;VENTOLIN HFA) 108 (90 BASE) MCG/ACT inhaler Inhale 2 puffs into the lungs every 6 (six) hours as needed for wheezing or shortness of breath. , Until Discontinued, Historical Med    albuterol (PROVENTIL) (2.5 MG/3ML) 0.083% nebulizer solution Take 2.5 mg by nebulization every 6 (six) hours as needed for wheezing or shortness of breath. , Until Discontinued, Historical Med    aspirin EC 81 MG tablet Take 81 mg by mouth daily. , Until Discontinued, Historical Med    cholecalciferol (VITAMIN D)  1000 units tablet Take 1,000 Units by mouth daily., Until Discontinued, Historical Med    clopidogrel (PLAVIX) 75 MG tablet Take 75 mg by mouth daily., Until Discontinued, Historical Med    docusate sodium (COLACE) 100 MG capsule Take 1 capsule (100 mg total) by mouth daily., Starting 04/15/2015, Until Discontinued, Normal    fluticasone (FLOVENT HFA) 110 MCG/ACT inhaler Inhale 1 puff into the lungs 2 (two) times daily., Until Discontinued, Historical Med    folic acid (FOLVITE) 400 MCG tablet Take 400 mcg by mouth daily., Until Discontinued, Historical Med    furosemide (LASIX) 20 MG tablet Take 40 mg by mouth daily. , Until Discontinued, Historical Med    hydroxychloroquine (PLAQUENIL) 200 MG tablet Take 200 mg by mouth daily., Until Discontinued, Historical Med    Magnesium 250 MG TABS Take 250 mg by mouth daily., Until Discontinued, Historical Med    simvastatin (ZOCOR) 40 MG tablet Take 80 mg by mouth daily. , Until Discontinued, Historical Med    tiotropium (SPIRIVA) 18 MCG inhalation capsule Place 18 mcg into inhaler and inhale daily., Until Discontinued, Historical Med      STOP taking these medications     hydrocortisone-pramoxine (ANALPRAM-HC) 2.5-1 % rectal cream         Today   VITAL SIGNS:  Blood pressure 107/46, pulse 56, temperature 97.6 F (36.4 C), temperature source Oral, resp. rate 18, height 6' (1.829 m), weight 113.399 kg (250 lb), SpO2 95 %.  I/O:  No intake or output data in the 24 hours ending 09/09/15 1026  PHYSICAL EXAMINATION:  Physical Exam  GENERAL:  75 y.o.-year-old patient lying in the bed with no acute distress.  LUNGS: Normal breath sounds bilaterally, no wheezing, rales,rhonchi or crepitation. No use of accessory muscles of respiration.  CARDIOVASCULAR: S1, S2 normal. No murmurs, rubs, or gallops.  ABDOMEN: Soft, non-tender, non-distended. Bowel sounds present. No organomegaly or mass.  NEUROLOGIC: Moves all 4 extremities. PSYCHIATRIC: The  patient is alert and oriented x 3.  SKIN: No obvious rash, lesion, or ulcer.   DATA REVIEW:   CBC  Recent Labs Lab 09/03/15 0432  09/05/15 0613  WBC 8.1  --   --   HGB 11.2*  < > 10.7*  HCT 34.4*  --   --   PLT 161  --   --   < > = values in this interval not displayed.  Chemistries   Recent Labs Lab 09/02/15 1836 09/03/15 0432  NA 137 140  K 4.6 4.4  CL 104 106  CO2 26 30  GLUCOSE 106* 98  BUN 28* 29*  CREATININE 0.96 0.96  CALCIUM 8.9 8.7*  AST 23  --   ALT 14*  --   ALKPHOS 76  --   BILITOT 0.4  --     Cardiac Enzymes No results for input(s): TROPONINI in the last 168 hours.  Microbiology Results  Results for orders placed or performed in  visit on 06/05/15  Microscopic Examination     Status: Abnormal   Collection Time: 06/05/15 10:42 AM  Result Value Ref Range Status   WBC, UA 6-10 (A) 0 -  5 /hpf Final   RBC, UA 0-2 0 -  2 /hpf Final   Epithelial Cells (non renal) None seen 0 - 10 /hpf Final   Mucus, UA Present (A) Not Estab. Final   Bacteria, UA Few (A) None seen/Few Final    RADIOLOGY:  No results found.  Follow up with PCP in 1 week.  Management plans discussed with the patient, family and they are in agreement.  CODE STATUS:  Code Status History    Date Active Date Inactive Code Status Order ID Comments User Context   09/02/2015 10:17 PM 09/05/2015  6:11 PM Full Code [id number]  Houston Siren, MD Inpatient   04/14/2015  4:29 PM 04/15/2015  6:58 PM Full Code [id number]  Vanna Scotland, MD Inpatient   07/13/2014  2:05 PM 07/19/2014  3:23 PM Full Code [id number]  Derrell Lolling, RN Inpatient    Advance Directive Documentation        Most Recent Value   Type of Advance Directive  Healthcare Power of Attorney, Living will   Pre-existing out of facility DNR order (yellow form or pink MOST form)     "MOST" Form in Place?        TOTAL TIME TAKING CARE OF THIS PATIENT ON DAY OF DISCHARGE: more than 30 minutes.   Milagros Loll R M.D on 09/09/2015 at  10:26 AM  Between 7am to 6pm - Pager - [phone]  After 6pm go to [email or web address] - password [email or web address]  [facility]  Office  [phone]  CC: Primary care physician; Lauro Regulus., MD  Note: This dictation was prepared with Dragon dictation along with smaller phrase technology. Any transcriptional errors that result from this process are unintentional.

## 2015-09-18 ENCOUNTER — Ambulatory Visit
Admission: RE | Admit: 2015-09-18 | Discharge: 2015-09-18 | Disposition: A | Discharge status: Home / Self Care | Payer: Medicare HMO | Source: Ambulatory Visit | Attending: Gastroenterology | Admitting: Gastroenterology

## 2015-09-18 ENCOUNTER — Ambulatory Visit: Payer: Medicare HMO | Admitting: Anesthesiology

## 2015-09-18 ENCOUNTER — Encounter
Admission: RE | Disposition: A | Discharge status: Home / Self Care | Payer: Self-pay | Source: Ambulatory Visit | Attending: Gastroenterology

## 2015-09-18 DIAGNOSIS — Z6833 Body mass index (BMI) 33.0-33.9, adult: Secondary | ICD-10-CM | POA: Diagnosis not present

## 2015-09-18 DIAGNOSIS — K2950 Unspecified chronic gastritis without bleeding: Secondary | ICD-10-CM | POA: Insufficient documentation

## 2015-09-18 DIAGNOSIS — E669 Obesity, unspecified: Secondary | ICD-10-CM | POA: Diagnosis not present

## 2015-09-18 DIAGNOSIS — I110 Hypertensive heart disease with heart failure: Secondary | ICD-10-CM | POA: Insufficient documentation

## 2015-09-18 DIAGNOSIS — I350 Nonrheumatic aortic (valve) stenosis: Secondary | ICD-10-CM | POA: Diagnosis not present

## 2015-09-18 DIAGNOSIS — Z8711 Personal history of peptic ulcer disease: Secondary | ICD-10-CM | POA: Insufficient documentation

## 2015-09-18 DIAGNOSIS — I272 Other secondary pulmonary hypertension: Secondary | ICD-10-CM | POA: Diagnosis not present

## 2015-09-18 DIAGNOSIS — Z86010 Personal history of colonic polyps: Secondary | ICD-10-CM | POA: Insufficient documentation

## 2015-09-18 DIAGNOSIS — Z8249 Family history of ischemic heart disease and other diseases of the circulatory system: Secondary | ICD-10-CM | POA: Diagnosis not present

## 2015-09-18 DIAGNOSIS — R011 Cardiac murmur, unspecified: Secondary | ICD-10-CM | POA: Insufficient documentation

## 2015-09-18 DIAGNOSIS — K5730 Diverticulosis of large intestine without perforation or abscess without bleeding: Secondary | ICD-10-CM | POA: Insufficient documentation

## 2015-09-18 DIAGNOSIS — I509 Heart failure, unspecified: Secondary | ICD-10-CM | POA: Insufficient documentation

## 2015-09-18 DIAGNOSIS — K2210 Ulcer of esophagus without bleeding: Secondary | ICD-10-CM | POA: Insufficient documentation

## 2015-09-18 DIAGNOSIS — Z955 Presence of coronary angioplasty implant and graft: Secondary | ICD-10-CM | POA: Diagnosis not present

## 2015-09-18 DIAGNOSIS — Z87891 Personal history of nicotine dependence: Secondary | ICD-10-CM | POA: Diagnosis not present

## 2015-09-18 DIAGNOSIS — K2960 Other gastritis without bleeding: Secondary | ICD-10-CM | POA: Diagnosis not present

## 2015-09-18 DIAGNOSIS — M069 Rheumatoid arthritis, unspecified: Secondary | ICD-10-CM | POA: Diagnosis not present

## 2015-09-18 DIAGNOSIS — Z801 Family history of malignant neoplasm of trachea, bronchus and lung: Secondary | ICD-10-CM | POA: Insufficient documentation

## 2015-09-18 DIAGNOSIS — D509 Iron deficiency anemia, unspecified: Secondary | ICD-10-CM | POA: Insufficient documentation

## 2015-09-18 DIAGNOSIS — G4710 Hypersomnia, unspecified: Secondary | ICD-10-CM | POA: Insufficient documentation

## 2015-09-18 DIAGNOSIS — Q438 Other specified congenital malformations of intestine: Secondary | ICD-10-CM | POA: Insufficient documentation

## 2015-09-18 DIAGNOSIS — J449 Chronic obstructive pulmonary disease, unspecified: Secondary | ICD-10-CM | POA: Insufficient documentation

## 2015-09-18 DIAGNOSIS — G4730 Sleep apnea, unspecified: Secondary | ICD-10-CM | POA: Insufficient documentation

## 2015-09-18 DIAGNOSIS — N400 Enlarged prostate without lower urinary tract symptoms: Secondary | ICD-10-CM | POA: Diagnosis not present

## 2015-09-18 DIAGNOSIS — E785 Hyperlipidemia, unspecified: Secondary | ICD-10-CM | POA: Diagnosis not present

## 2015-09-18 DIAGNOSIS — K921 Melena: Secondary | ICD-10-CM | POA: Insufficient documentation

## 2015-09-18 DIAGNOSIS — I252 Old myocardial infarction: Secondary | ICD-10-CM | POA: Diagnosis not present

## 2015-09-18 DIAGNOSIS — K210 Gastro-esophageal reflux disease with esophagitis: Secondary | ICD-10-CM | POA: Diagnosis not present

## 2015-09-18 DIAGNOSIS — G43909 Migraine, unspecified, not intractable, without status migrainosus: Secondary | ICD-10-CM | POA: Insufficient documentation

## 2015-09-18 DIAGNOSIS — I2510 Atherosclerotic heart disease of native coronary artery without angina pectoris: Secondary | ICD-10-CM | POA: Diagnosis not present

## 2015-09-18 DIAGNOSIS — Z800 Family history of malignant neoplasm of digestive organs: Secondary | ICD-10-CM | POA: Insufficient documentation

## 2015-09-18 HISTORY — PX: ESOPHAGOGASTRODUODENOSCOPY (EGD) WITH PROPOFOL: SHX5813

## 2015-09-18 HISTORY — PX: COLONOSCOPY WITH PROPOFOL: SHX5780

## 2015-09-18 SURGERY — COLONOSCOPY WITH PROPOFOL
Anesthesia: General

## 2015-09-18 MED ORDER — PROPOFOL 500 MG/50ML IV EMUL
INTRAVENOUS | Status: DC | PRN
  Administered 2015-09-18: 120 ug/kg/min via INTRAVENOUS

## 2015-09-18 MED ORDER — SODIUM CHLORIDE 0.9 % IV SOLN
INTRAVENOUS | Status: DC
  Administered 2015-09-18: 12:00:00 via INTRAVENOUS

## 2015-09-18 MED ORDER — SODIUM CHLORIDE 0.9 % IV SOLN: INTRAVENOUS | Status: DC

## 2015-09-18 MED ORDER — MIDAZOLAM HCL 2 MG/2ML IJ SOLN
INTRAMUSCULAR | Status: DC | PRN
  Administered 2015-09-18: 1 mg via INTRAVENOUS

## 2015-09-18 MED ORDER — PHENYLEPHRINE HCL 10 MG/ML IJ SOLN
INTRAMUSCULAR | Status: DC | PRN
  Administered 2015-09-18 (×3): 100 ug via INTRAVENOUS

## 2015-09-18 MED ORDER — PROPOFOL 10 MG/ML IV BOLUS
INTRAVENOUS | Status: DC | PRN
  Administered 2015-09-18: 3 mg via INTRAVENOUS
  Administered 2015-09-18: 30 mg via INTRAVENOUS

## 2015-09-18 MED ORDER — FENTANYL CITRATE (PF) 100 MCG/2ML IJ SOLN
INTRAMUSCULAR | Status: DC | PRN
  Administered 2015-09-18: 50 ug via INTRAVENOUS

## 2015-09-18 NOTE — Anesthesia Postprocedure Evaluation (Signed)
Anesthesia Post Note  Patient: VANDERBILT RANIERI  Procedure(s) Performed: Procedure(s) (LRB): COLONOSCOPY WITH PROPOFOL (N/A) ESOPHAGOGASTRODUODENOSCOPY (EGD) WITH PROPOFOL (N/A)  Patient location during evaluation: PACU Anesthesia Type: General Level of consciousness: awake Pain management: pain level controlled Vital Signs Assessment: post-procedure vital signs reviewed and stable Respiratory status: spontaneous breathing Cardiovascular status: stable Anesthetic complications: no    Last Vitals:  Filed Vitals:   09/18/15 1221 09/18/15 1334  BP: 140/67 122/65  Pulse: 70 66  Temp: 37.1 C 37.2 C  Resp: 18 30    Last Pain: There were no vitals filed for this visit.               VAN STAVEREN,Sony Schlarb

## 2015-09-18 NOTE — Anesthesia Preprocedure Evaluation (Signed)
Anesthesia Evaluation  Patient identified by MRN, date of birth, ID band Patient awake    Reviewed: Allergy & Precautions, NPO status , Patient's Chart, lab work & pertinent test results  Airway Mallampati: II       Dental  (+) Teeth Intact   Pulmonary asthma , sleep apnea , former smoker,    breath sounds clear to auscultation       Cardiovascular hypertension, + CAD, +CHF and + Orthopnea   Rhythm:Regular Rate:Normal     Neuro/Psych  Headaches,    GI/Hepatic Neg liver ROS, PUD,   Endo/Other  negative endocrine ROS  Renal/GU negative Renal ROS     Musculoskeletal   Abdominal (+) + obese,   Peds  Hematology   Anesthesia Other Findings   Reproductive/Obstetrics                             Anesthesia Physical Anesthesia Plan  ASA: III  Anesthesia Plan: General   Post-op Pain Management:    Induction: Intravenous  Airway Management Planned: Natural Airway and Nasal Cannula  Additional Equipment:   Intra-op Plan:   Post-operative Plan:   Informed Consent: I have reviewed the patients History and Physical, chart, labs and discussed the procedure including the risks, benefits and alternatives for the proposed anesthesia with the patient or authorized representative who has indicated his/her understanding and acceptance.     Plan Discussed with: CRNA  Anesthesia Plan Comments:         Anesthesia Quick Evaluation

## 2015-09-18 NOTE — Op Note (Signed)
[facility] Gastroenterology Patient Name: Andre Hayes Procedure Date: 09/18/2015 12:33 PM MRN: [id number] Account #: [id number] Date of Birth: 03/13/1941 Admit Type: Outpatient Age: 75 Room: [facility] ENDO ROOM 3 Gender: Male Note Status: Finalized Procedure:            Upper GI endoscopy Indications:          Iron deficiency anemia, Heme positive stool Providers:            Christena Deem, MD Referring MD:         Marya Amsler. Dareen Piano MD, MD (Referring MD) Medicines:            Monitored Anesthesia Care Complications:        No immediate complications. Procedure:            Pre-Anesthesia Assessment:                       - ASA Grade Assessment: III - A patient with severe                        systemic disease.                       After obtaining informed consent, the endoscope was                        passed under direct vision. Throughout the procedure,                        the patient's blood pressure, pulse, and oxygen                        saturations were monitored continuously. The Endoscope                        was introduced through the mouth, and advanced to the                        third part of duodenum. The patient tolerated the                        procedure well. Findings:      LA Grade A (one or more mucosal breaks less than 5 mm, not extending       between tops of 2 mucosal folds) esophagitis with no bleeding was found.       Biopsies were taken with a cold forceps for histology.      Patchy moderate inflammation characterized by congestion (edema),       erosions and erythema was found at the incisura, in the gastric antrum       and in the prepyloric region of the stomach. Biopsies were taken with a       cold forceps for histology. Biopsies were taken with a cold forceps for       Helicobacter pylori testing.      The cardia and gastric fundus were normal on retroflexion.      The exam of the stomach was otherwise normal.     The examined duodenum was normal. Impression:           - LA Grade A erosive esophagitis. Biopsied.                       -  Erosive gastritis. Biopsied.                       - Normal examined duodenum. Recommendation:       - Await pathology results.                       - Telephone GI clinic for pathology results in 1 week. Procedure Code(s):    --- Professional ---                       [phone], Esophagogastroduodenoscopy, flexible, transoral;                        with biopsy, single or multiple Diagnosis Code(s):    --- Professional ---                       K20.8, Other esophagitis                       K29.60, Other gastritis without bleeding                       D50.9, Iron deficiency anemia, unspecified                       R19.5, Other fecal abnormalities CPT copyright 2016 American Medical Association. All rights reserved. The codes documented in this report are preliminary and upon coder review may  be revised to meet current compliance requirements. Christena Deem, MD 09/18/2015 12:53:39 PM This report has been signed electronically. Number of Addenda: 0 Note Initiated On: 09/18/2015 12:33 PM      [facility]

## 2015-09-18 NOTE — Op Note (Signed)
[facility] Gastroenterology Patient Name: Andre Hayes Procedure Date: 09/18/2015 12:32 PM MRN: [id number] Account #: [id number] Date of Birth: 1940/11/14 Admit Type: Outpatient Age: 75 Room: [facility] ENDO ROOM 3 Gender: Male Note Status: Finalized Procedure:            Colonoscopy Indications:          Heme positive stool, Unexplained iron deficiency anemia Providers:            Christena Deem, MD Referring MD:         Marya Amsler. Dareen Piano MD, MD (Referring MD) Medicines:            Monitored Anesthesia Care Complications:        No immediate complications. Procedure:            Pre-Anesthesia Assessment:                       - ASA Grade Assessment: III - A patient with severe                        systemic disease.                       After obtaining informed consent, the colonoscope was                        passed under direct vision. Throughout the procedure,                        the patient's blood pressure, pulse, and oxygen                        saturations were monitored continuously. The                        Colonoscope was introduced through the anus with the                        intention of advancing to the cecum. The scope was                        advanced to the hepatic flexure before the procedure                        was aborted. Medications were given. The patient                        tolerated the procedure well. The quality of the bowel                        preparation was fair. Findings:      Non-bleeding internal hemorrhoids were found during anoscopy. The       hemorrhoids were small and Grade II (internal hemorrhoids that prolapse       but reduce spontaneously).      Multiple small and large-mouthed diverticula were found in the sigmoid       colon, descending colon and transverse colon.      The sigmoid colon, descending colon and transverse colon were       significantly redundant.      The digital rectal exam was  normal. Impression:           -  Preparation of the colon was fair.                       - Non-bleeding internal hemorrhoids.                       - Diverticulosis in the sigmoid colon, in the                        descending colon and in the transverse colon.                       - Redundant colon.                       - No specimens collected. Recommendation:       - Use Citrucel one tablespoon PO daily.                       - Use Analpram HC Cream 2.5%: Apply externally TID for                        10 days. Procedure Code(s):    --- Professional ---                       [phone], 53, Colonoscopy, flexible; diagnostic, including                        collection of specimen(s) by brushing or washing, when                        performed (separate procedure) Diagnosis Code(s):    --- Professional ---                       K64.1, Second degree hemorrhoids                       R19.5, Other fecal abnormalities                       D50.9, Iron deficiency anemia, unspecified                       K57.30, Diverticulosis of large intestine without                        perforation or abscess without bleeding                       Q43.8, Other specified congenital malformations of                        intestine CPT copyright 2016 American Medical Association. All rights reserved. The codes documented in this report are preliminary and upon coder review may  be revised to meet current compliance requirements. Christena Deem, MD 09/18/2015 1:32:04 PM This report has been signed electronically. Number of Addenda: 0 Note Initiated On: 09/18/2015 12:32 PM Scope Withdrawal Time: 0 hours 4 minutes 18 seconds  Total Procedure Duration: 0 hours 27 minutes 52 seconds       [facility]

## 2015-09-18 NOTE — H&P (Signed)
Outpatient short stay form Pre-procedure 09/18/2015 12:30 PM Christena Deem MD  Primary Physician: Dr. Einar Crow  Reason for visit:  EGD and colonoscopy  History of present illness:  Patient is a 75 year old male presenting today for EGD and colonoscopy as above. He had a recent hospitalization at [facility] with some rectal bleeding. Scant that time was negative. He had been on dual antiplatelet treatment due to his history of coronary artery stent placement in 2014. His Plavix had been held for now pending these procedures and he has been continuing on the aspirin. He tolerated his prep well. He denies use of any other aspirin products or blood thinning agents.    Current facility-administered medications:  .  0.9 %  sodium chloride infusion, , Intravenous, Continuous, Christena Deem, MD, Last Rate: 20 mL/hr at 09/18/15 1225 .  0.9 %  sodium chloride infusion, , Intravenous, Continuous, Christena Deem, MD  Prescriptions prior to admission  Medication Sig Dispense Refill Last Dose  . docusate sodium (COLACE) 100 MG capsule Take 1 capsule (100 mg total) by mouth daily. 10 capsule 0 09/18/2015 at 0930  . fluticasone (FLOVENT HFA) 110 MCG/ACT inhaler Inhale 1 puff into the lungs 2 (two) times daily.   09/18/2015 at 0930time  . folic acid (FOLVITE) 400 MCG tablet Take 400 mcg by mouth daily.   09/18/2015 at 0930  . furosemide (LASIX) 20 MG tablet Take 40 mg by mouth daily.    09/18/2015 at 0930  . Magnesium 250 MG TABS Take 250 mg by mouth daily.   09/18/2015 at 0930  . simvastatin (ZOCOR) 40 MG tablet Take 80 mg by mouth daily.    09/18/2015 at 0930  . tamsulosin (FLOMAX) 0.4 MG CAPS capsule Take 0.4 mg by mouth 2 (two) times daily.     Marland Kitchen tiotropium (SPIRIVA) 18 MCG inhalation capsule Place 18 mcg into inhaler and inhale daily.   09/18/2015 at 0930  . acetaminophen (TYLENOL) 325 MG tablet Take 650 mg by mouth every 6 (six) hours as needed for mild pain or headache.     Past Month at Unknown time  . albuterol (PROVENTIL HFA;VENTOLIN HFA) 108 (90 BASE) MCG/ACT inhaler Inhale 2 puffs into the lungs every 6 (six) hours as needed for wheezing or shortness of breath.    Past Month at Unknown time  . albuterol (PROVENTIL) (2.5 MG/3ML) 0.083% nebulizer solution Take 2.5 mg by nebulization every 6 (six) hours as needed for wheezing or shortness of breath.    Past Month at Unknown time  . aspirin EC 81 MG tablet Take 81 mg by mouth daily.    09/01/2015 at Unsure   . cholecalciferol (VITAMIN D) 1000 units tablet Take 1,000 Units by mouth daily.   09/01/2015 at Unknown time  . clopidogrel (PLAVIX) 75 MG tablet Take 75 mg by mouth daily.   09/01/2015 at Unsure   . hydroxychloroquine (PLAQUENIL) 200 MG tablet Take 200 mg by mouth daily.   09/01/2015 at Unknown time     Allergies  Allergen Reactions  . Erythromycin Rash     Past Medical History  Diagnosis Date  . COPD (chronic obstructive pulmonary disease) (HCC)   . Hyperlipidemia   . Pneumonia 05/02/14    not hospitalized  . Rheumatoid arthritis (HCC)   . Pulmonary disease   . CAD (coronary artery disease)   . Congestive heart failure (HCC)     NYHA 3, EF>55%  . Duodenal ulcer   . BPH (benign prostatic  hypertrophy)   . Hypertension   . Heart murmur   . Myocardial infarction (HCC)   . Sleep apnea   . Aortic stenosis   . Headache   . History of peptic ulcer disease   . Senile cataracts of both eyes   . Bronchitis   . Shortness of breath dyspnea   . CHF (congestive heart failure) (HCC)   . Murmur   . Edema, lower extremity   . Wheezing   . Orthopnea     Review of systems:      Physical Exam    Heart and lungs: Regular rate and rhythm without rub or gallop, there is a diffuse pre-Cordial systolic murmur, lungs are bilaterally clear    HEENT: Normocephalic atraumatic eyes are anicteric    Other:     Pertinant exam for procedure: Soft nontender nondistended bowel sounds positive  normoactive.    Planned proceedures: EGD, colonoscopy and indicated procedures. I have discussed the risks benefits and complications of procedures to include not limited to bleeding, infection, perforation and the risk of sedation and the patient wishes to proceed.    Christena Deem, MD Gastroenterology 09/18/2015  12:30 PM

## 2015-09-18 NOTE — Transfer of Care (Signed)
Immediate Anesthesia Transfer of Care Note  Patient: Andre Hayes  Procedure(s) Performed: Procedure(s): COLONOSCOPY WITH PROPOFOL (N/A) ESOPHAGOGASTRODUODENOSCOPY (EGD) WITH PROPOFOL (N/A)  Patient Location: PACU  Anesthesia Type:General  Level of Consciousness: sedated  Airway & Oxygen Therapy: Patient Spontanous Breathing and Patient connected to face mask oxygen  Post-op Assessment: Report given to RN and Post -op Vital signs reviewed and stable  Post vital signs: Reviewed and stable  Last Vitals:  Filed Vitals:   09/18/15 1221 09/18/15 1334  BP: 140/67 122/65  Pulse: 70 66  Temp: 37.1 C 37.2 C  Resp: 18 30    Complications: No apparent anesthesia complications

## 2015-09-19 ENCOUNTER — Encounter: Payer: Self-pay | Admitting: Gastroenterology

## 2015-09-19 LAB — SURGICAL PATHOLOGY

## 2015-09-22 ENCOUNTER — Other Ambulatory Visit: Payer: Self-pay | Admitting: Gastroenterology

## 2015-09-22 DIAGNOSIS — R195 Other fecal abnormalities: Secondary | ICD-10-CM

## 2015-10-08 ENCOUNTER — Ambulatory Visit
Admission: RE | Admit: 2015-10-08 | Discharge: 2015-10-08 | Disposition: A | Discharge status: Home / Self Care | Payer: Medicare HMO | Source: Ambulatory Visit | Attending: Gastroenterology | Admitting: Gastroenterology

## 2015-10-08 DIAGNOSIS — R195 Other fecal abnormalities: Secondary | ICD-10-CM

## 2015-12-09 ENCOUNTER — Ambulatory Visit: Payer: Medicare HMO | Admitting: Urology

## 2015-12-19 ENCOUNTER — Ambulatory Visit: Payer: Medicare HMO | Admitting: Urology

## 2016-01-01 ENCOUNTER — Ambulatory Visit: Payer: Medicare HMO | Admitting: Urology

## 2016-01-05 ENCOUNTER — Encounter (INDEPENDENT_AMBULATORY_CARE_PROVIDER_SITE_OTHER): Payer: Medicare HMO

## 2016-01-06 ENCOUNTER — Ambulatory Visit (INDEPENDENT_AMBULATORY_CARE_PROVIDER_SITE_OTHER): Payer: Self-pay | Admitting: Vascular Surgery

## 2016-01-08 ENCOUNTER — Other Ambulatory Visit (INDEPENDENT_AMBULATORY_CARE_PROVIDER_SITE_OTHER): Payer: Medicare HMO

## 2016-01-08 ENCOUNTER — Other Ambulatory Visit (INDEPENDENT_AMBULATORY_CARE_PROVIDER_SITE_OTHER): Payer: Self-pay | Admitting: Vascular Surgery

## 2016-01-08 DIAGNOSIS — M7989 Other specified soft tissue disorders: Secondary | ICD-10-CM

## 2016-01-08 DIAGNOSIS — M79605 Pain in left leg: Secondary | ICD-10-CM

## 2016-01-08 DIAGNOSIS — M79604 Pain in right leg: Secondary | ICD-10-CM

## 2016-01-09 ENCOUNTER — Encounter (INDEPENDENT_AMBULATORY_CARE_PROVIDER_SITE_OTHER): Payer: Self-pay | Admitting: Vascular Surgery

## 2016-01-09 ENCOUNTER — Ambulatory Visit (INDEPENDENT_AMBULATORY_CARE_PROVIDER_SITE_OTHER): Payer: Medicare HMO | Admitting: Vascular Surgery

## 2016-01-09 ENCOUNTER — Ambulatory Visit: Payer: Medicare HMO | Admitting: Urology

## 2016-01-09 VITALS — BP 129/69 | HR 69 | Resp 17 | Ht 72.0 in | Wt 249.0 lb

## 2016-01-09 DIAGNOSIS — M7989 Other specified soft tissue disorders: Secondary | ICD-10-CM

## 2016-01-09 DIAGNOSIS — M79609 Pain in unspecified limb: Secondary | ICD-10-CM | POA: Insufficient documentation

## 2016-01-09 DIAGNOSIS — M79605 Pain in left leg: Secondary | ICD-10-CM

## 2016-01-09 DIAGNOSIS — I10 Essential (primary) hypertension: Secondary | ICD-10-CM

## 2016-01-09 DIAGNOSIS — M79604 Pain in right leg: Secondary | ICD-10-CM | POA: Diagnosis not present

## 2016-01-09 NOTE — Assessment & Plan Note (Signed)
blood pressure control important in reducing the progression of atherosclerotic disease. On appropriate oral medications.  

## 2016-01-09 NOTE — Assessment & Plan Note (Signed)
Negative venous workup today. I suspect his pain is more related to arthritis, neuropathy, or other musculoskeletal issues. No vascular intervention planned.

## 2016-01-09 NOTE — Assessment & Plan Note (Signed)
Clearly contribute to lower extremity swelling. Exercise would be of benefit.

## 2016-01-09 NOTE — Assessment & Plan Note (Signed)
Negative venous workup today. I suspect his swelling is more related to arthritis, neuropathy, or other musculoskeletal issues. No vascular intervention planned.

## 2016-01-09 NOTE — Progress Notes (Signed)
MRN : [id number]  Andre Hayes is a 75 y.o. (01-27-1941) male who presents with chief complaint of  Chief Complaint  Patient presents with  . Follow-up  .  History of Present Illness: Patient returns today in follow up of his swelling and pain in his legs. He has left tightness in his ankle and down into his toes. The compression stockings have helped this. Both legs are affected. He does not have ulceration or infection. His venous duplex today revealed no evidence of deep venous thrombosis or acute superficial thrombophlebitis. 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 This would be unlikely to be the cause of his symptoms.  Current Outpatient Prescriptions  Medication Sig Dispense Refill  . acetaminophen (TYLENOL) 325 MG tablet Take 650 mg by mouth every 6 (six) hours as needed for mild pain or headache.     . albuterol (PROVENTIL HFA;VENTOLIN HFA) 108 (90 BASE) MCG/ACT inhaler Inhale 2 puffs into the lungs every 6 (six) hours as needed for wheezing or shortness of breath.     Marland Kitchen albuterol (PROVENTIL) (2.5 MG/3ML) 0.083% nebulizer solution Take 2.5 mg by nebulization every 6 (six) hours as needed for wheezing or shortness of breath.     Marland Kitchen aspirin EC 81 MG tablet Take 81 mg by mouth daily.     . cholecalciferol (VITAMIN D) 1000 units tablet Take 1,000 Units by mouth daily.    . clopidogrel (PLAVIX) 75 MG tablet Take 75 mg by mouth daily.    . fluticasone (FLOVENT HFA) 110 MCG/ACT inhaler Inhale 1 puff into the lungs 2 (two) times daily.    . folic acid (FOLVITE) 400 MCG tablet Take 400 mcg by mouth daily.    . furosemide (LASIX) 20 MG tablet Take 40 mg by mouth daily.     . hydroxychloroquine (PLAQUENIL) 200 MG tablet Take 200 mg by mouth daily.    . Magnesium 250 MG TABS Take 250 mg by mouth daily.    . simvastatin (ZOCOR) 40 MG tablet Take 80 mg by mouth daily.     Marland Kitchen tiotropium (SPIRIVA) 18 MCG inhalation  capsule Place 18 mcg into inhaler and inhale daily.    . vitamin B-12 (CYANOCOBALAMIN) 1000 MCG tablet Take 1,000 mcg by mouth daily.    Marland Kitchen docusate sodium (COLACE) 100 MG capsule Take 1 capsule (100 mg total) by mouth daily. (Patient not taking: Reported on 01/09/2016) 10 capsule 0  . tamsulosin (FLOMAX) 0.4 MG CAPS capsule Take 0.4 mg by mouth 2 (two) times daily.     No current facility-administered medications for this visit.     Past Medical History:  Diagnosis Date  . Aortic stenosis   . BPH (benign prostatic hypertrophy)   . Bronchitis   . CAD (coronary artery disease)   . CHF (congestive heart failure) (HCC)   . Congestive heart failure (HCC)    NYHA 3, EF>55%  . COPD (chronic obstructive pulmonary disease) (HCC)   . Duodenal ulcer   . Edema, lower extremity   . Headache   . Heart murmur   . History of peptic ulcer disease   . Hyperlipidemia   . Hypertension   . Murmur   . Myocardial infarction   . Orthopnea   . Pneumonia 05/02/14   not hospitalized  . Pulmonary disease   . Rheumatoid arthritis (HCC)   . Senile cataracts of both eyes   . Shortness of breath  dyspnea   . Sleep apnea   . Wheezing     Past Surgical History:  Procedure Laterality Date  . CATARACT EXTRACTION W/PHACO Right 06/23/2015   Procedure: CATARACT EXTRACTION PHACO AND INTRAOCULAR LENS PLACEMENT (IOC);  Surgeon: Sallee Lange, MD;  Location: [facility] ORS;  Service: Ophthalmology;  Laterality: Right;  [place] 01:36 AP% 24.5 CDE 41.30 fluid pack lot # [id number]  . COLONOSCOPY WITH PROPOFOL N/A 09/18/2015   Procedure: COLONOSCOPY WITH PROPOFOL;  Surgeon: Christena Deem, MD;  Location: [facility] ENDOSCOPY;  Service: Endoscopy;  Laterality: N/A;  . CORONARY ANGIOPLASTY WITH STENT PLACEMENT     Dr. Juliann Pares  . ESOPHAGOGASTRODUODENOSCOPY (EGD) WITH PROPOFOL N/A 09/18/2015   Procedure: ESOPHAGOGASTRODUODENOSCOPY (EGD) WITH PROPOFOL;  Surgeon: Christena Deem, MD;  Location: [facility] ENDOSCOPY;  Service: Endoscopy;   Laterality: N/A;  . HOLEP-LASER ENUCLEATION OF THE PROSTATE WITH MORCELLATION N/A 04/14/2015   Procedure: HOLEP-LASER ENUCLEATION OF THE PROSTATE WITH MORCELLATION;  Surgeon: Vanna Scotland, MD;  Location: [facility] ORS;  Service: Urology;  Laterality: N/A;  . LUNG SURGERY      Social History Social History  Substance Use Topics  . Smoking status: Former Smoker    Packs/day: 1.00    Years: 10.00    Types: Cigarettes    Start date: 06/17/1954    Quit date: 06/16/1964  . Smokeless tobacco: Never Used  . Alcohol use No    Family History Family History  Problem Relation Age of Onset  . Tuberculosis Mother   . COPD Mother   . Emphysema Mother   . Heart disease Father   . Stomach cancer Father   . Breast cancer Sister   . Colon cancer Brother     Allergies  Allergen Reactions  . Erythromycin Rash     REVIEW OF SYSTEMS (Negative unless checked)  Constitutional: [] Weight loss  [] Fever  [] Chills Cardiac: [] Chest pain   [] Chest pressure   [] Palpitations   [] Shortness of breath when laying flat   [] Shortness of breath at rest   [] Shortness of breath with exertion. Vascular:  [] Pain in legs with walking   [] Pain in legs at rest   [] Pain in legs when laying flat   [] Claudication   [] Pain in feet when walking  [] Pain in feet at rest  [] Pain in feet when laying flat   [] History of DVT   [] Phlebitis   [x] Swelling in legs   [] Varicose veins   [] Non-healing ulcers Pulmonary:   [] Uses home oxygen   [] Productive cough   [] Hemoptysis   [] Wheeze  [] COPD   [] Asthma Neurologic:  [] Dizziness  [] Blackouts   [] Seizures   [] History of stroke   [] History of TIA  [] Aphasia   [] Temporary blindness   [] Dysphagia   [] Weakness or numbness in arms   [] Weakness or numbness in legs Musculoskeletal:  [x] Arthritis   [] Joint swelling   [x] Joint pain   [] Low back pain Hematologic:  [] Easy bruising  [] Easy bleeding   [] Hypercoagulable state   [] Anemic   Gastrointestinal:  [] Blood in stool   [] Vomiting blood   [] Gastroesophageal reflux/heartburn   [] Abdominal pain Genitourinary:  [] Chronic kidney disease   [] Difficult urination  [] Frequent urination  [] Burning with urination   [] Hematuria Skin:  [] Rashes   [] Ulcers   [] Wounds Psychological:  [] History of anxiety   []  History of major depression.  Physical Examination  BP 129/69   Pulse 69   Resp 17   Ht 6' (1.829 m)   Wt 112.9 kg (249 lb)   BMI 33.77 kg/m  Gen:  WD/WN, NAD Head: /AT, No temporalis wasting. Ear/Nose/Throat: Hearing grossly intact, nares w/o erythema or drainage, trachea midline Eyes: Conjunctiva clear. Sclera non-icteric Neck: Supple.  No JVD.  Pulmonary:  Good air movement, no use of accessory muscles.  Cardiac: RRR, normal S1, S2 Vascular:  Vessel Right Left  Radial Palpable Palpable                                   Gastrointestinal: soft, non-tender/non-distended. No guarding/reflex.  Musculoskeletal: M/S 5/5 throughout.  No deformity or atrophy. 1+ bilateral lower extremity edema. Neurologic: Sensation grossly intact in extremities.  Symmetrical.  Speech is fluent.  Psychiatric: Judgment intact, Mood & affect appropriate for pt's clinical situation. Dermatologic: No rashes or ulcers noted.  No cellulitis or open wounds. Lymph : No Cervical, Axillary, or Inguinal lymphadenopathy.      Labs No results found for this or any previous visit (from the past 2160 hour(s)).  Radiology No results found.    Assessment/Plan  Morbid obesity (HCC) Clearly contribute to lower extremity swelling. Exercise would be of benefit.  Benign essential HTN blood pressure control important in reducing the progression of atherosclerotic disease. On appropriate oral medications.   Pain in limb Negative venous workup today. I suspect his pain is more related to arthritis, neuropathy, or other musculoskeletal issues. No vascular intervention planned.  Swelling of limb Negative venous workup today. I suspect his  swelling is more related to arthritis, neuropathy, or other musculoskeletal issues. No vascular intervention planned.    Festus Barren, MD  01/09/2016 1:50 PM    This note was created with Dragon medical transcription system.  Any errors from dictation are purely unintentional

## 2016-01-12 ENCOUNTER — Telehealth (INDEPENDENT_AMBULATORY_CARE_PROVIDER_SITE_OTHER): Payer: Self-pay | Admitting: Vascular Surgery

## 2016-01-12 NOTE — Telephone Encounter (Signed)
As per Dr. Wyn Quaker note:  Assessment/Plan  Morbid obesity (HCC) Clearly contribute to lower extremity swelling. Exercise would be of benefit.  Benign essential HTN blood pressure control important in reducing the progression of atherosclerotic disease. On appropriate oral medications.   Pain in limb Negative venous workup today. I suspect his pain is more related to arthritis, neuropathy, or other musculoskeletal issues. No vascular intervention planned.  Swelling of limb Negative venous workup today. I suspect his swelling is more related to arthritis, neuropathy, or other musculoskeletal issues. No vascular intervention planned.  If the patient feels strongly he can have an ABI completed since a tech told him another test is available but it seems Dr. Wyn Quaker does not feel he has any PAD contributing to his discomfort

## 2016-01-12 NOTE — Telephone Encounter (Signed)
Patient states that he came in for an [place] and the tech that did the [place] told him that there was another [place] that could be done and although he was told that the one he had was ok he was wondering if he should have the other type. I looked in the patients appts and he had an venous reflux and he is wondering if he should have the arterial done.

## 2016-01-12 NOTE — Telephone Encounter (Signed)
Called patient with the information that was given to me from the P.A. Raul Del and the patient wanted to know what would help other than compression hose and I explained that elevation of the legs above heart level helps. He seemed to misunderstand that exercise, blood pressure control and possible arthritis could be factors.

## 2016-01-12 NOTE — Telephone Encounter (Signed)
Should the patient be scheduled for another U/S ?

## 2016-01-13 ENCOUNTER — Ambulatory Visit (INDEPENDENT_AMBULATORY_CARE_PROVIDER_SITE_OTHER): Payer: Medicare HMO | Admitting: Vascular Surgery

## 2016-01-13 DIAGNOSIS — R739 Hyperglycemia, unspecified: Secondary | ICD-10-CM | POA: Insufficient documentation

## 2016-01-23 ENCOUNTER — Ambulatory Visit (INDEPENDENT_AMBULATORY_CARE_PROVIDER_SITE_OTHER): Payer: Medicare HMO | Admitting: Urology

## 2016-01-23 ENCOUNTER — Encounter: Payer: Self-pay | Admitting: Urology

## 2016-01-23 VITALS — BP 118/68 | HR 85 | Ht 72.0 in | Wt 247.0 lb

## 2016-01-23 DIAGNOSIS — R351 Nocturia: Secondary | ICD-10-CM

## 2016-01-23 DIAGNOSIS — N400 Enlarged prostate without lower urinary tract symptoms: Secondary | ICD-10-CM | POA: Diagnosis not present

## 2016-01-23 LAB — BLADDER SCAN AMB NON-IMAGING: Scan Result: 0

## 2016-01-23 MED ORDER — TAMSULOSIN HCL 0.4 MG PO CAPS: 0.4000 mg | ORAL_CAPSULE | Freq: Two times a day (BID) | ORAL | 11 refills | Status: DC

## 2016-01-23 NOTE — Progress Notes (Signed)
01/23/2016 9:28 AM   Andre Hayes 1940/08/17 [id number]  Referring provider: Lauro Regulus, MD [address] [facility] [place] [facility] [place], [place] [address]  Chief Complaint  Patient presents with  . Benign Prostatic Hypertrophy    HPI: 75 yo M  With BPH s/p HoLEP 04/14/15 who returns today for routine follow up.    His is doing fairly well, he does have some intermittency but feels like he is able to empty well.  He feels like his stream is viable.  He still gets up 2-3 times nightly which is unchange.  His sleeping pattern has changed.  He continues to use his CPAP.  No dysuria, hematuria or UTIs.    IPSS today 12/3.  No longer on any BPH meds.     PVR today 0.  PSA for new baseline drawn today.        IPSS    Row Name 01/23/16 0900         International Prostate Symptom Score   How often have you had the sensation of not emptying your bladder? Not at All     How often have you had to urinate less than every two hours? Less than half the time     How often have you found you stopped and started again several times when you urinated? Almost always     How often have you found it difficult to postpone urination? Not at All     How often have you had a weak urinary stream? Less than half the time     How often have you had to strain to start urination? Not at All     How many times did you typically get up at night to urinate? 3 Times     Total IPSS Score 12       Quality of Life due to urinary symptoms   If you were to spend the rest of your life with your urinary condition just the way it is now how would you feel about that? Mixed        Score:  1-7 Mild 8-19 Moderate 20-35 Severe   PMH: Past Medical History:  Diagnosis Date  . Aortic stenosis   . BPH (benign prostatic hypertrophy)   . Bronchitis   . CAD (coronary artery disease)   . CHF (congestive heart failure) (HCC)   . Congestive heart failure (HCC)    NYHA 3, EF>55%  . COPD  (chronic obstructive pulmonary disease) (HCC)   . Duodenal ulcer   . Edema, lower extremity   . Headache   . Heart murmur   . History of peptic ulcer disease   . Hyperlipidemia   . Hypertension   . Murmur   . Myocardial infarction   . Orthopnea   . Pneumonia 05/02/14   not hospitalized  . Pulmonary disease   . Rheumatoid arthritis (HCC)   . Senile cataracts of both eyes   . Shortness of breath dyspnea   . Sleep apnea   . Wheezing     Surgical History: Past Surgical History:  Procedure Laterality Date  . CATARACT EXTRACTION W/PHACO Right 06/23/2015   Procedure: CATARACT EXTRACTION PHACO AND INTRAOCULAR LENS PLACEMENT (IOC);  Surgeon: Sallee Lange, MD;  Location: [facility] ORS;  Service: Ophthalmology;  Laterality: Right;  [place] 01:36 AP% 24.5 CDE 41.30 fluid pack lot # [id number]  . COLONOSCOPY WITH PROPOFOL N/A 09/18/2015   Procedure: COLONOSCOPY WITH PROPOFOL;  Surgeon: Christena Deem, MD;  Location: [facility] ENDOSCOPY;  Service: Endoscopy;  Laterality: N/A;  . CORONARY ANGIOPLASTY WITH STENT PLACEMENT     Dr. Juliann Pares  . ESOPHAGOGASTRODUODENOSCOPY (EGD) WITH PROPOFOL N/A 09/18/2015   Procedure: ESOPHAGOGASTRODUODENOSCOPY (EGD) WITH PROPOFOL;  Surgeon: Christena Deem, MD;  Location: [facility] ENDOSCOPY;  Service: Endoscopy;  Laterality: N/A;  . HOLEP-LASER ENUCLEATION OF THE PROSTATE WITH MORCELLATION N/A 04/14/2015   Procedure: HOLEP-LASER ENUCLEATION OF THE PROSTATE WITH MORCELLATION;  Surgeon: Vanna Scotland, MD;  Location: [facility] ORS;  Service: Urology;  Laterality: N/A;  . LUNG SURGERY      Home Medications:    Medication List       Accurate as of 01/23/16  9:28 AM. Always use your most recent med list.          acetaminophen 325 MG tablet Commonly known as:  TYLENOL Take 650 mg by mouth every 6 (six) hours as needed for mild pain or headache.   albuterol 108 (90 Base) MCG/ACT inhaler Commonly known as:  PROVENTIL HFA;VENTOLIN HFA Inhale 2 puffs into the lungs every 6  (six) hours as needed for wheezing or shortness of breath.   albuterol (2.5 MG/3ML) 0.083% nebulizer solution Commonly known as:  PROVENTIL Take 2.5 mg by nebulization every 6 (six) hours as needed for wheezing or shortness of breath.   aspirin EC 81 MG tablet Take 81 mg by mouth daily.   cholecalciferol 1000 units tablet Commonly known as:  VITAMIN D Take 1,000 Units by mouth daily.   clopidogrel 75 MG tablet Commonly known as:  PLAVIX Take 75 mg by mouth daily.   docusate sodium 100 MG capsule Commonly known as:  COLACE Take 1 capsule (100 mg total) by mouth daily.   fluticasone 110 MCG/ACT inhaler Commonly known as:  FLOVENT HFA Inhale 1 puff into the lungs 2 (two) times daily.   folic acid 400 MCG tablet Commonly known as:  FOLVITE Take 400 mcg by mouth daily.   furosemide 20 MG tablet Commonly known as:  LASIX Take 40 mg by mouth daily.   hydroxychloroquine 200 MG tablet Commonly known as:  PLAQUENIL Take 200 mg by mouth daily.   Magnesium 250 MG Tabs Take 250 mg by mouth daily.   simvastatin 40 MG tablet Commonly known as:  ZOCOR Take 80 mg by mouth daily.   tamsulosin 0.4 MG Caps capsule Commonly known as:  FLOMAX Take 1 capsule (0.4 mg total) by mouth 2 (two) times daily.   tiotropium 18 MCG inhalation capsule Commonly known as:  SPIRIVA Place 18 mcg into inhaler and inhale daily.   vitamin B-12 1000 MCG tablet Commonly known as:  CYANOCOBALAMIN Take 1,000 mcg by mouth daily.       Allergies:  Allergies  Allergen Reactions  . Erythromycin Rash    Family History: Family History  Problem Relation Age of Onset  . Tuberculosis Mother   . COPD Mother   . Emphysema Mother   . Heart disease Father   . Stomach cancer Father   . Breast cancer Sister   . Colon cancer Brother     Social History:  reports that he quit smoking about 51 years ago. His smoking use included Cigarettes. He started smoking about 61 years ago. He has a 10.00 pack-year  smoking history. He has never used smokeless tobacco. He reports that he does not drink alcohol or use drugs.  ROS: UROLOGY Frequent Urination?: No Hard to postpone urination?: No Burning/pain with urination?: No Get up at night to urinate?: Yes Leakage  of urine?: No Urine stream starts and stops?: No Trouble starting stream?: No Do you have to strain to urinate?: No Blood in urine?: No Urinary tract infection?: No Sexually transmitted disease?: No Injury to kidneys or bladder?: No Painful intercourse?: No Weak stream?: No Erection problems?: No Penile pain?: No  Gastrointestinal Nausea?: No Vomiting?: No Indigestion/heartburn?: No Diarrhea?: No Constipation?: No  Constitutional Fever: No Night sweats?: No Weight loss?: No Fatigue?: No  Skin Skin rash/lesions?: No Itching?: No  Eyes Blurred vision?: No Double vision?: No  Ears/Nose/Throat Sore throat?: No Sinus problems?: No  Hematologic/Lymphatic Swollen glands?: No Easy bruising?: No  Cardiovascular Leg swelling?: Yes Chest pain?: No  Respiratory Cough?: No Shortness of breath?: Yes  Endocrine Excessive thirst?: No  Musculoskeletal Back pain?: No Joint pain?: Yes  Neurological Headaches?: No Dizziness?: No  Psychologic Depression?: No Anxiety?: No  Physical Exam: BP 118/68 (BP Location: Left Arm, Patient Position: Sitting, Cuff Size: Large)   Pulse 85   Ht 6' (1.829 m)   Wt 247 lb (112 kg)   BMI 33.50 kg/m   Constitutional:  Alert and oriented, No acute distress. HEENT: [place] AT, moist mucus membranes.  Trachea midline, no masses. Cardiovascular: No clubbing, cyanosis, or edema. Respiratory: Normal respiratory effort, no increased work of breathing. GI: Abdomen is soft, nontender, nondistended, no abdominal masses GU: No CVA tenderness.  DRE deferred given age Skin: No rashes, bruises or suspicious lesions. Neurologic: Grossly intact, no focal deficits, moving all 4  extremities. Psychiatric: Normal mood and affect.  Laboratory Data: Lab Results  Component Value Date   WBC 8.1 09/03/2015   HGB 10.7 (L) 09/05/2015   HCT 34.4 (L) 09/03/2015   MCV 77.0 (L) 09/03/2015   PLT 161 09/03/2015    Lab Results  Component Value Date   CREATININE 0.96 09/03/2015   PSA 2.4 on 12/24/2014  Pertinent Imaging: Results for orders placed or performed in visit on 01/23/16  Bladder Scan (Post Void Residual) in office  Result Value Ref Range   Scan Result 0     Assessment & Plan:   1. BPH (benign prostatic hyperplasia) s/p HoLEP 03/2015 PVR minimal - 0 Overall doing fairly well other than intermittency and nocturia Advised to resume flomax for 1-2 weeks to see if helps with intermittency PSA for new baseline now that he is off finasteride/ s/p HoLEP- discussed cessation PSA screening if wnl - Urinalysis, Complete - BLADDER SCAN AMB NON-IMAGING  2. Nocturia Stressed compliance with CPAP Likely multifactorial  LE leg swelling- no wearing compression hose  Return in about 1 year (around 01/22/2017) for IPSS, PVR.  Vanna Scotland, MD  [facility] [address] [place], [place] [address] [phone]

## 2016-01-24 LAB — PSA: Prostate Specific Ag, Serum: 6.4 ng/mL — ABNORMAL HIGH (ref 0.0–4.0)

## 2016-02-02 ENCOUNTER — Telehealth: Payer: Self-pay

## 2016-02-02 ENCOUNTER — Telehealth: Payer: Self-pay | Admitting: Urology

## 2016-02-02 NOTE — Telephone Encounter (Signed)
LMOM

## 2016-02-02 NOTE — Telephone Encounter (Signed)
-----   Message from Vanna Scotland, MD sent at 01/29/2016  4:43 PM EST ----- PSA is actually up more than I expected coming off of finasteride and s/p surgery.  I would like to have this repeat in 3 months. Please arrange a lab only visit for this purpose.    Vanna Scotland, MD

## 2016-02-02 NOTE — Telephone Encounter (Signed)
Tracy gave pt message.  

## 2016-02-04 ENCOUNTER — Ambulatory Visit: Payer: Medicare HMO | Admitting: Urology

## 2016-02-16 ENCOUNTER — Ambulatory Visit: Payer: Medicare HMO | Attending: Family | Admitting: Family

## 2016-02-16 ENCOUNTER — Encounter: Payer: Self-pay | Admitting: Family

## 2016-02-16 VITALS — BP 134/60 | HR 75 | Resp 18 | Ht 72.0 in | Wt 248.0 lb

## 2016-02-16 DIAGNOSIS — G4733 Obstructive sleep apnea (adult) (pediatric): Secondary | ICD-10-CM

## 2016-02-16 DIAGNOSIS — Z7982 Long term (current) use of aspirin: Secondary | ICD-10-CM | POA: Insufficient documentation

## 2016-02-16 DIAGNOSIS — I5032 Chronic diastolic (congestive) heart failure: Secondary | ICD-10-CM

## 2016-02-16 DIAGNOSIS — Z79899 Other long term (current) drug therapy: Secondary | ICD-10-CM | POA: Insufficient documentation

## 2016-02-16 DIAGNOSIS — I10 Essential (primary) hypertension: Secondary | ICD-10-CM

## 2016-02-16 DIAGNOSIS — M069 Rheumatoid arthritis, unspecified: Secondary | ICD-10-CM

## 2016-02-16 DIAGNOSIS — I110 Hypertensive heart disease with heart failure: Secondary | ICD-10-CM | POA: Diagnosis not present

## 2016-02-16 DIAGNOSIS — Z9989 Dependence on other enabling machines and devices: Secondary | ICD-10-CM

## 2016-02-16 DIAGNOSIS — Z9889 Other specified postprocedural states: Secondary | ICD-10-CM | POA: Diagnosis not present

## 2016-02-16 DIAGNOSIS — Z87891 Personal history of nicotine dependence: Secondary | ICD-10-CM | POA: Insufficient documentation

## 2016-02-16 NOTE — Progress Notes (Signed)
Patient ID: Andre Hayes, male    DOB: 1940/07/20, 75 y.o.   MRN: [id number]  HPI  Mr Makin is a 75 y/o male with a history of obstructive sleep apnea (CPAP), RA, pneumonia, MI, HTN, hyperlipidemia, PUD, murmur, duodenal ulcer, COPD, bronchitis, BPH, aortic stenosis, remote tobacco use and chronic heart failure.  Last echo was done 08/19/14 and showed an EF of >55% with mild AR/MR//TR/PR along with mild aortic stenosis. Had ECG stress test done 08/19/14 as well which showed normal myocardial thickening and wall motion along with homogeneous tracer distribution throughout the myocardium.  Had an endoscopy & colonoscopy done 09/18/15. Was last admitted on 09/02/15 for an acute GI bleed. Had a nuclear bleeding scan done. GI consult was obtained. Aspirin and Plavix were stopped for 3 days and then resumed. Discharged after 3 days.   He presents today for a follow-up visit with a mild amount of fatigue and shortness of breath upon moderate exertion. Denied being short of breath upon walking into the office today. Continues to weigh himself daily and says that he's gradually lost some weight as he's tried to decrease his caloric consumption. Biggest issue that he's been having is a flare of his rheumatoid arthritis and he's being followed closely by his rheumatologist.   Past Medical History:  Diagnosis Date  . Aortic stenosis   . BPH (benign prostatic hypertrophy)   . Bronchitis   . CAD (coronary artery disease)   . CHF (congestive heart failure) (HCC)   . Congestive heart failure (HCC)    NYHA 3, EF>55%  . COPD (chronic obstructive pulmonary disease) (HCC)   . Duodenal ulcer   . Edema, lower extremity   . Headache   . Heart murmur   . History of peptic ulcer disease   . Hyperlipidemia   . Hypertension   . Murmur   . Myocardial infarction   . Orthopnea   . Pneumonia 05/02/14   not hospitalized  . Pulmonary disease   . Rheumatoid arthritis (HCC)   . Senile cataracts of both eyes   .  Shortness of breath dyspnea   . Sleep apnea   . Wheezing     Past Surgical History:  Procedure Laterality Date  . CATARACT EXTRACTION W/PHACO Right 06/23/2015   Procedure: CATARACT EXTRACTION PHACO AND INTRAOCULAR LENS PLACEMENT (IOC);  Surgeon: Sallee Lange, MD;  Location: [facility] ORS;  Service: Ophthalmology;  Laterality: Right;  [place] 01:36 AP% 24.5 CDE 41.30 fluid pack lot # [id number]  . COLONOSCOPY WITH PROPOFOL N/A 09/18/2015   Procedure: COLONOSCOPY WITH PROPOFOL;  Surgeon: Christena Deem, MD;  Location: [facility] ENDOSCOPY;  Service: Endoscopy;  Laterality: N/A;  . CORONARY ANGIOPLASTY WITH STENT PLACEMENT     Dr. Juliann Pares  . ESOPHAGOGASTRODUODENOSCOPY (EGD) WITH PROPOFOL N/A 09/18/2015   Procedure: ESOPHAGOGASTRODUODENOSCOPY (EGD) WITH PROPOFOL;  Surgeon: Christena Deem, MD;  Location[facility] ENDOSCOPY;  Service: Endoscopy;  Laterality: N/A;  . HOLEP-LASER ENUCLEATION OF THE PROSTATE WITH MORCELLATION N/A 04/14/2015   Procedure: HOLEP-LASER ENUCLEATION OF THE PROSTATE WITH MORCELLATION;  Surgeon: Vanna Scotland, MD;  Location: [facility] ORS;  Service: Urology;  Laterality: N/A;  . LUNG SURGERY      Family History  Problem Relation Age of Onset  . Tuberculosis Mother   . COPD Mother   . Emphysema Mother   . Heart disease Father   . Stomach cancer Father   . Breast cancer Sister   . Colon cancer Brother     Social History  Substance Use  Topics  . Smoking status: Former Smoker    Packs/day: 1.00    Years: 10.00    Types: Cigarettes    Start date: 06/17/1954    Quit date: 06/16/1964  . Smokeless tobacco: Never Used  . Alcohol use No    Allergies  Allergen Reactions  . Erythromycin Rash    Prior to Admission medications   Medication Sig Start Date End Date Taking? Authorizing Provider  acetaminophen (TYLENOL) 325 MG tablet Take 650 mg by mouth every 6 (six) hours as needed for mild pain or headache.    Yes Historical Provider, MD  albuterol (PROVENTIL HFA;VENTOLIN HFA) 108 (90  BASE) MCG/ACT inhaler Inhale 2 puffs into the lungs every 6 (six) hours as needed for wheezing or shortness of breath.    Yes Historical Provider, MD  albuterol (PROVENTIL) (2.5 MG/3ML) 0.083% nebulizer solution Take 2.5 mg by nebulization every 6 (six) hours as needed for wheezing or shortness of breath.    Yes Historical Provider, MD  aspirin EC 81 MG tablet Take 81 mg by mouth daily.    Yes Historical Provider, MD  cholecalciferol (VITAMIN D) 1000 units tablet Take 1,000 Units by mouth daily.   Yes Historical Provider, MD  clopidogrel (PLAVIX) 75 MG tablet Take 75 mg by mouth daily.   Yes Historical Provider, MD  docusate sodium (COLACE) 100 MG capsule Take 1 capsule (100 mg total) by mouth daily. 04/15/15  Yes Shannon A McGowan, PA-C  fluticasone (FLOVENT HFA) 110 MCG/ACT inhaler Inhale 1 puff into the lungs 2 (two) times daily.   Yes Historical Provider, MD  folic acid (FOLVITE) 400 MCG tablet Take 400 mcg by mouth daily.   Yes Historical Provider, MD  furosemide (LASIX) 20 MG tablet Take 40 mg by mouth daily.    Yes Historical Provider, MD  hydroxychloroquine (PLAQUENIL) 200 MG tablet Take 200 mg by mouth daily.   Yes Historical Provider, MD  Magnesium 250 MG TABS Take 250 mg by mouth daily.   Yes Historical Provider, MD  simvastatin (ZOCOR) 40 MG tablet Take 80 mg by mouth daily.    Yes Historical Provider, MD  sulfaSALAzine (AZULFIDINE) 500 MG tablet Take 500 mg by mouth 2 (two) times daily.   Yes Historical Provider, MD  tamsulosin (FLOMAX) 0.4 MG CAPS capsule Take 1 capsule (0.4 mg total) by mouth 2 (two) times daily. 01/23/16  Yes Vanna Scotland, MD  tiotropium (SPIRIVA) 18 MCG inhalation capsule Place 18 mcg into inhaler and inhale daily.   Yes Historical Provider, MD  vitamin B-12 (CYANOCOBALAMIN) 1000 MCG tablet Take 1,000 mcg by mouth daily.   Yes Historical Provider, MD     Review of Systems  Constitutional: Positive for fatigue (mild). Negative for appetite change.  HENT:  Positive for congestion (in mornings after wearing CPAP). Negative for postnasal drip and sore throat.   Eyes: Negative.   Respiratory: Positive for shortness of breath (only with moderate exertion; bending over at the waist). Negative for cough and chest tightness.   Cardiovascular: Negative for chest pain, palpitations and leg swelling (wearing compression socks).  Gastrointestinal: Negative for abdominal distention and abdominal pain.  Endocrine: Negative.   Genitourinary: Negative.   Musculoskeletal: Positive for arthralgias (left knee/both ankle arthritis pain at times). Negative for back pain.  Skin: Negative.   Allergic/Immunologic: Negative.   Neurological: Positive for light-headedness ("at times"). Negative for dizziness.  Hematological: Negative for adenopathy. Bruises/bleeds easily.  Psychiatric/Behavioral: Negative for dysphoric mood and sleep disturbance (sleeping on 2 pillows with CPAP  at night). The patient is not nervous/anxious.    Vitals:   02/16/16 1255  BP: 134/60  Pulse: 75  Resp: 18  SpO2: 93%  Weight: 248 lb (112.5 kg)  Height: 6' (1.829 m)   Wt Readings from Last 3 Encounters:  02/16/16 248 lb (112.5 kg)  01/23/16 247 lb (112 kg)  01/09/16 249 lb (112.9 kg)   Lab Results  Component Value Date   CREATININE 0.96 09/03/2015   CREATININE 0.96 09/02/2015   CREATININE 0.97 04/15/2015   Physical Exam  Constitutional: He is oriented to person, place, and time. He appears well-developed and well-nourished.  HENT:  Head: Normocephalic and atraumatic.  Eyes: Conjunctivae are normal. Pupils are equal, round, and reactive to light.  Neck: Normal range of motion. Neck supple.  Cardiovascular: Normal rate and regular rhythm.   Pulmonary/Chest: Effort normal. He has no wheezes. He has no rales.  Abdominal: Soft. He exhibits no distension. There is no tenderness.  Musculoskeletal: He exhibits no edema or tenderness.  Neurological: He is alert and oriented to  person, place, and time.  Skin: Skin is warm and dry.  Psychiatric: He has a normal mood and affect. His behavior is normal. Thought content normal.  Nursing note and vitals reviewed.  Assessment & Plan:  1: Chronic heart failure with preserved ejection fraction- - NYHA class II - euvolemic - continues to weigh daily and reports a gradual weight loss due to decreased caloric intake. Weight down 6.2 pounds since he was last here 6 months ago. Reminded to call for an overnight weight gain of >2 pounds or a weekly weight gain of >5 pounds - not adding salt to his food - received his flu vaccine for this season - due to see cardiologist Gwen Pounds) 02/23/16  2: HTN- - BP looks good today - continue medications - saw PCP Dareen Piano) on 01/13/16 - renal function checked on 01/13/16 and was normal (GFR 73)  3: Obstructive sleep apnea- - wearing CPAP nightly  - sees pulmonologist Meredeth Ide) on 06/08/16  4: Rheumatoid arthritis- - both ankles and left knee have been bothering him - now taking sulfasalazine twice daily - last saw rheumatologist Gavin Potters) 01/26/16 & returns to him on 04/27/16  Return here in 6 months or sooner for any questions/problems before then.

## 2016-02-16 NOTE — Patient Instructions (Signed)
Continue weighing daily and call for an overnight weight gain of > 2 pounds or a weekly weight gain of >5 pounds. 

## 2016-03-30 DIAGNOSIS — G4733 Obstructive sleep apnea (adult) (pediatric): Secondary | ICD-10-CM | POA: Diagnosis not present

## 2016-04-02 DIAGNOSIS — R739 Hyperglycemia, unspecified: Secondary | ICD-10-CM | POA: Diagnosis not present

## 2016-04-02 DIAGNOSIS — R270 Ataxia, unspecified: Secondary | ICD-10-CM | POA: Diagnosis not present

## 2016-04-06 DIAGNOSIS — M0579 Rheumatoid arthritis with rheumatoid factor of multiple sites without organ or systems involvement: Secondary | ICD-10-CM | POA: Diagnosis not present

## 2016-04-06 DIAGNOSIS — J208 Acute bronchitis due to other specified organisms: Secondary | ICD-10-CM | POA: Diagnosis not present

## 2016-04-06 DIAGNOSIS — R739 Hyperglycemia, unspecified: Secondary | ICD-10-CM | POA: Diagnosis not present

## 2016-04-06 DIAGNOSIS — G629 Polyneuropathy, unspecified: Secondary | ICD-10-CM | POA: Diagnosis not present

## 2016-04-23 DIAGNOSIS — J40 Bronchitis, not specified as acute or chronic: Secondary | ICD-10-CM | POA: Diagnosis not present

## 2016-04-23 DIAGNOSIS — M069 Rheumatoid arthritis, unspecified: Secondary | ICD-10-CM | POA: Diagnosis not present

## 2016-04-23 DIAGNOSIS — I5032 Chronic diastolic (congestive) heart failure: Secondary | ICD-10-CM | POA: Diagnosis not present

## 2016-04-30 DIAGNOSIS — J449 Chronic obstructive pulmonary disease, unspecified: Secondary | ICD-10-CM | POA: Diagnosis not present

## 2016-04-30 DIAGNOSIS — M0579 Rheumatoid arthritis with rheumatoid factor of multiple sites without organ or systems involvement: Secondary | ICD-10-CM | POA: Diagnosis not present

## 2016-04-30 DIAGNOSIS — G4733 Obstructive sleep apnea (adult) (pediatric): Secondary | ICD-10-CM | POA: Diagnosis not present

## 2016-05-04 ENCOUNTER — Other Ambulatory Visit: Payer: Medicare HMO

## 2016-05-20 ENCOUNTER — Telehealth: Payer: Self-pay | Admitting: Urology

## 2016-05-20 DIAGNOSIS — N401 Enlarged prostate with lower urinary tract symptoms: Secondary | ICD-10-CM

## 2016-05-20 MED ORDER — TAMSULOSIN HCL 0.4 MG PO CAPS: 0.4000 mg | ORAL_CAPSULE | Freq: Two times a day (BID) | ORAL | 11 refills | Status: AC

## 2016-05-20 NOTE — Telephone Encounter (Signed)
Spoke with pt in reference to flomax. Pt stated he is still taking the medication. Medication was sent to preferred pharmacy.

## 2016-05-20 NOTE — Telephone Encounter (Signed)
Do you want pt to continue flomax?  

## 2016-05-20 NOTE — Telephone Encounter (Signed)
Call to see if he is taking it and if so, refill.  Vanna Scotland, MD

## 2016-05-20 NOTE — Telephone Encounter (Signed)
Pt called office stating that he is need of a new Rx Tamsulosin 90 day supply of 0.4 mg capsules and would like to change from [facility] to mail order Rx. [facility] Fax # [phone], Phone # [phone]. Please advise.

## 2016-05-28 DIAGNOSIS — G4733 Obstructive sleep apnea (adult) (pediatric): Secondary | ICD-10-CM | POA: Diagnosis not present

## 2016-06-08 DIAGNOSIS — G4733 Obstructive sleep apnea (adult) (pediatric): Secondary | ICD-10-CM | POA: Diagnosis not present

## 2016-06-08 DIAGNOSIS — R0602 Shortness of breath: Secondary | ICD-10-CM | POA: Diagnosis not present

## 2016-06-08 DIAGNOSIS — J449 Chronic obstructive pulmonary disease, unspecified: Secondary | ICD-10-CM | POA: Diagnosis not present

## 2016-06-08 DIAGNOSIS — E669 Obesity, unspecified: Secondary | ICD-10-CM | POA: Diagnosis not present

## 2016-06-28 DIAGNOSIS — G4733 Obstructive sleep apnea (adult) (pediatric): Secondary | ICD-10-CM | POA: Diagnosis not present

## 2016-07-16 IMAGING — CR DG CHEST 1V PORT
1 series · 1 of 1 positions shown · non-contrast
Comparison: 02/12/2013

CLINICAL DATA: Shortness of breath

EXAM:
PORTABLE CHEST - 1 VIEW

[ap]
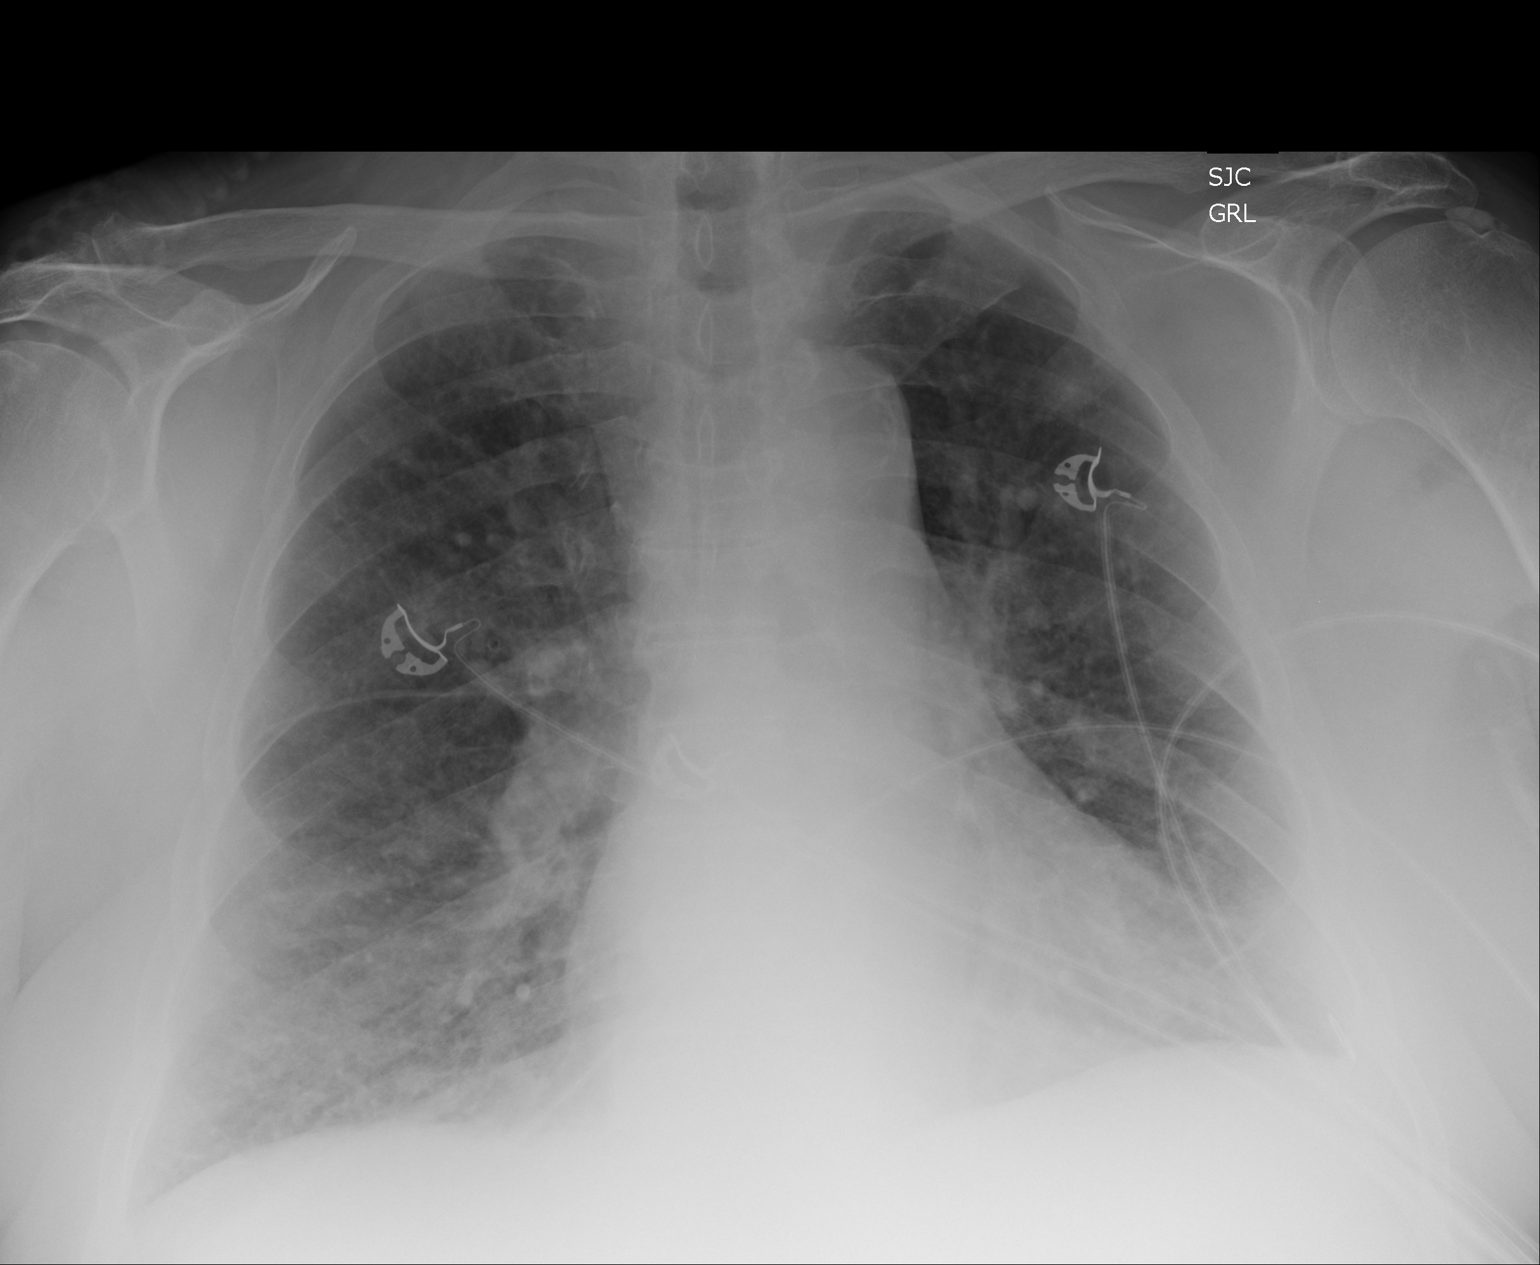

[1 of 1 positions shown; findings below may reference images not displayed]

FINDINGS: When accounting for lower mediastinal fat, heart size is likely
within normal limits. Negative aortic and hilar contours.

Interstitial coarsening without overt edema. No focal pneumonia or
collapse. No effusion or pneumothorax. The previously noted left
upper lobe nodule is not seen, likely obscured by the left anterior
second rib.
IMPRESSION: Pulmonary venous congestion without edema.

## 2016-07-17 IMAGING — CR DG CHEST 1V PORT
1 series · 1 of 1 positions shown · non-contrast
Comparison: Chest radiograph performed 07/09/2014

CLINICAL DATA: Endotracheal tube placement.  Initial encounter.

EXAM:
PORTABLE CHEST - 1 VIEW

[ap]
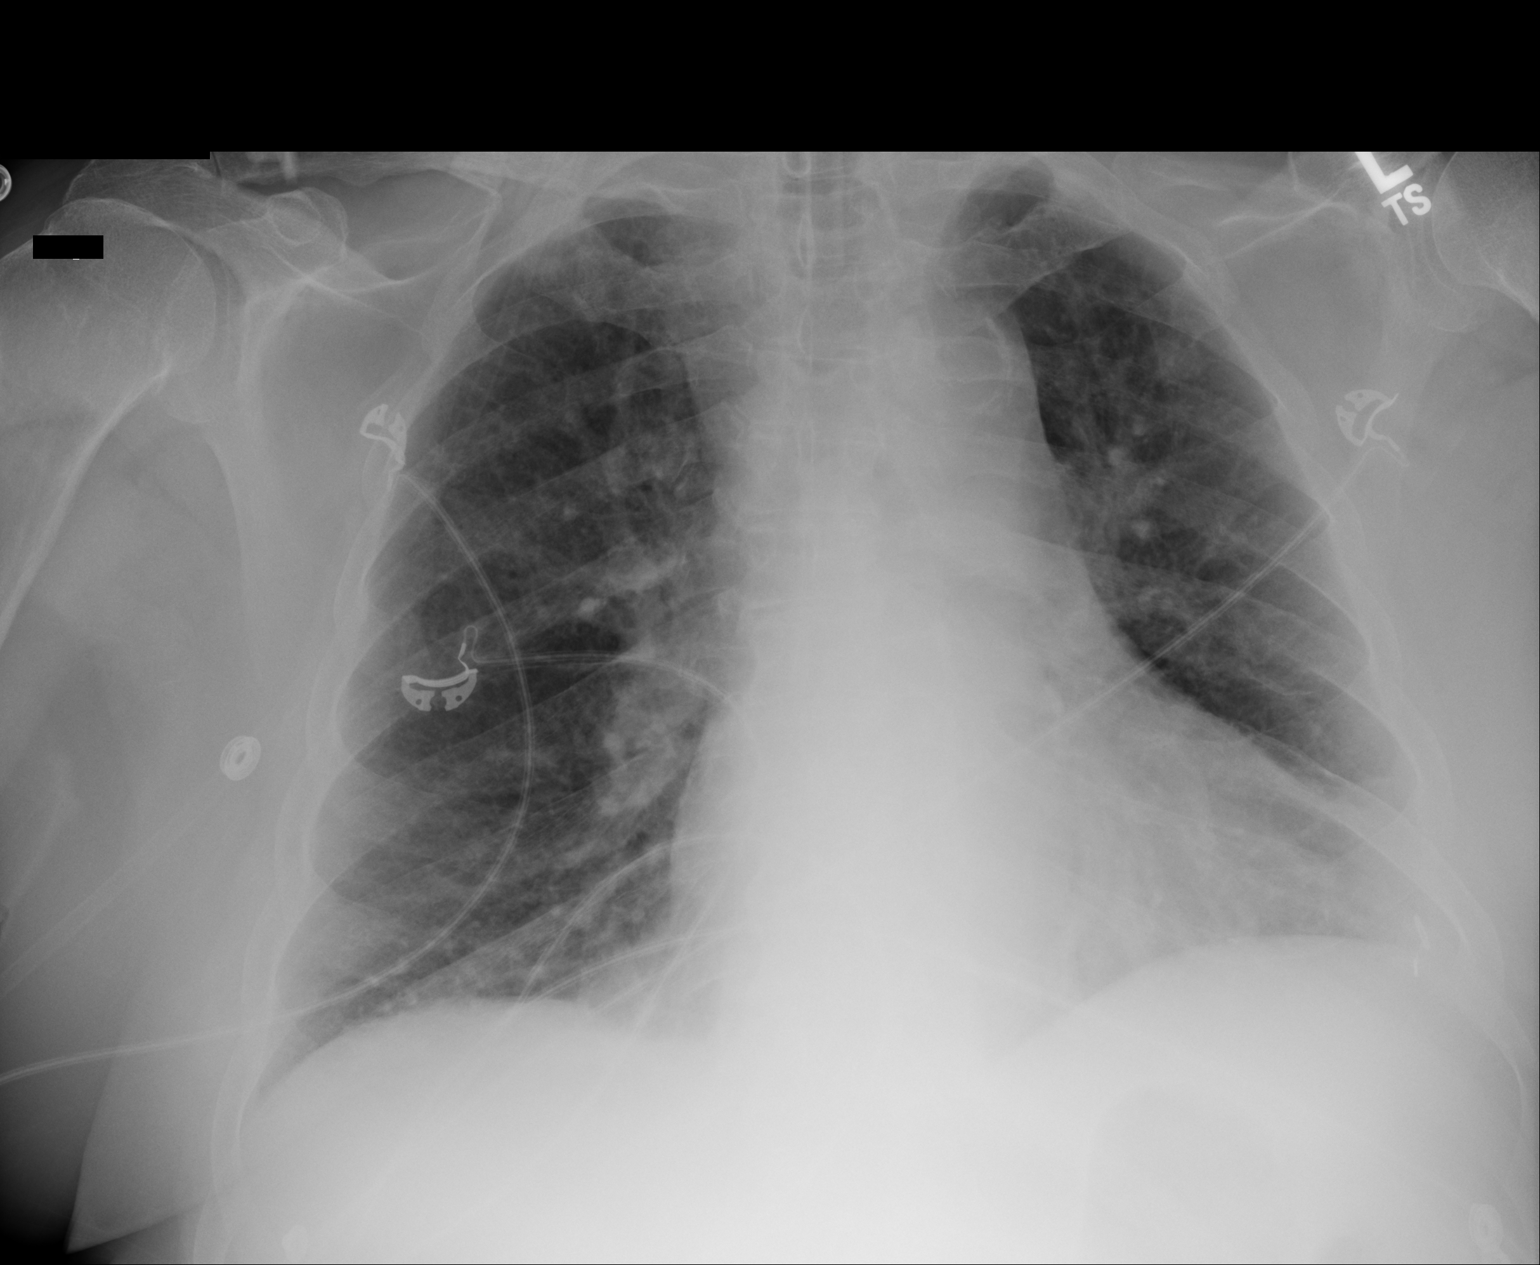

[1 of 1 positions shown; findings below may reference images not displayed]

FINDINGS: The patient's endotracheal tube is seen ending 4-5 cm above the
carina.

Vascular congestion is noted. Mildly interstitial markings raise
concern for mild interstitial edema, improved from the prior study.
No definite pleural effusion or pneumothorax is seen.

The cardiomediastinal silhouette is borderline normal in size. No
acute osseous abnormalities are identified.
IMPRESSION: 1. Endotracheal tube seen ending 4-5 cm above the carina.
2. Vascular congestion noted. Mildly increased interstitial markings
raise concern for mild interstitial edema, improved from the prior
study.

## 2016-07-18 IMAGING — CR DG ABDOMEN 1V
1 series · 1 of 1 positions shown · non-contrast
Comparison: None.

CLINICAL DATA: Orogastric tube placement

EXAM:
ABDOMEN - 1 VIEW

[ap]
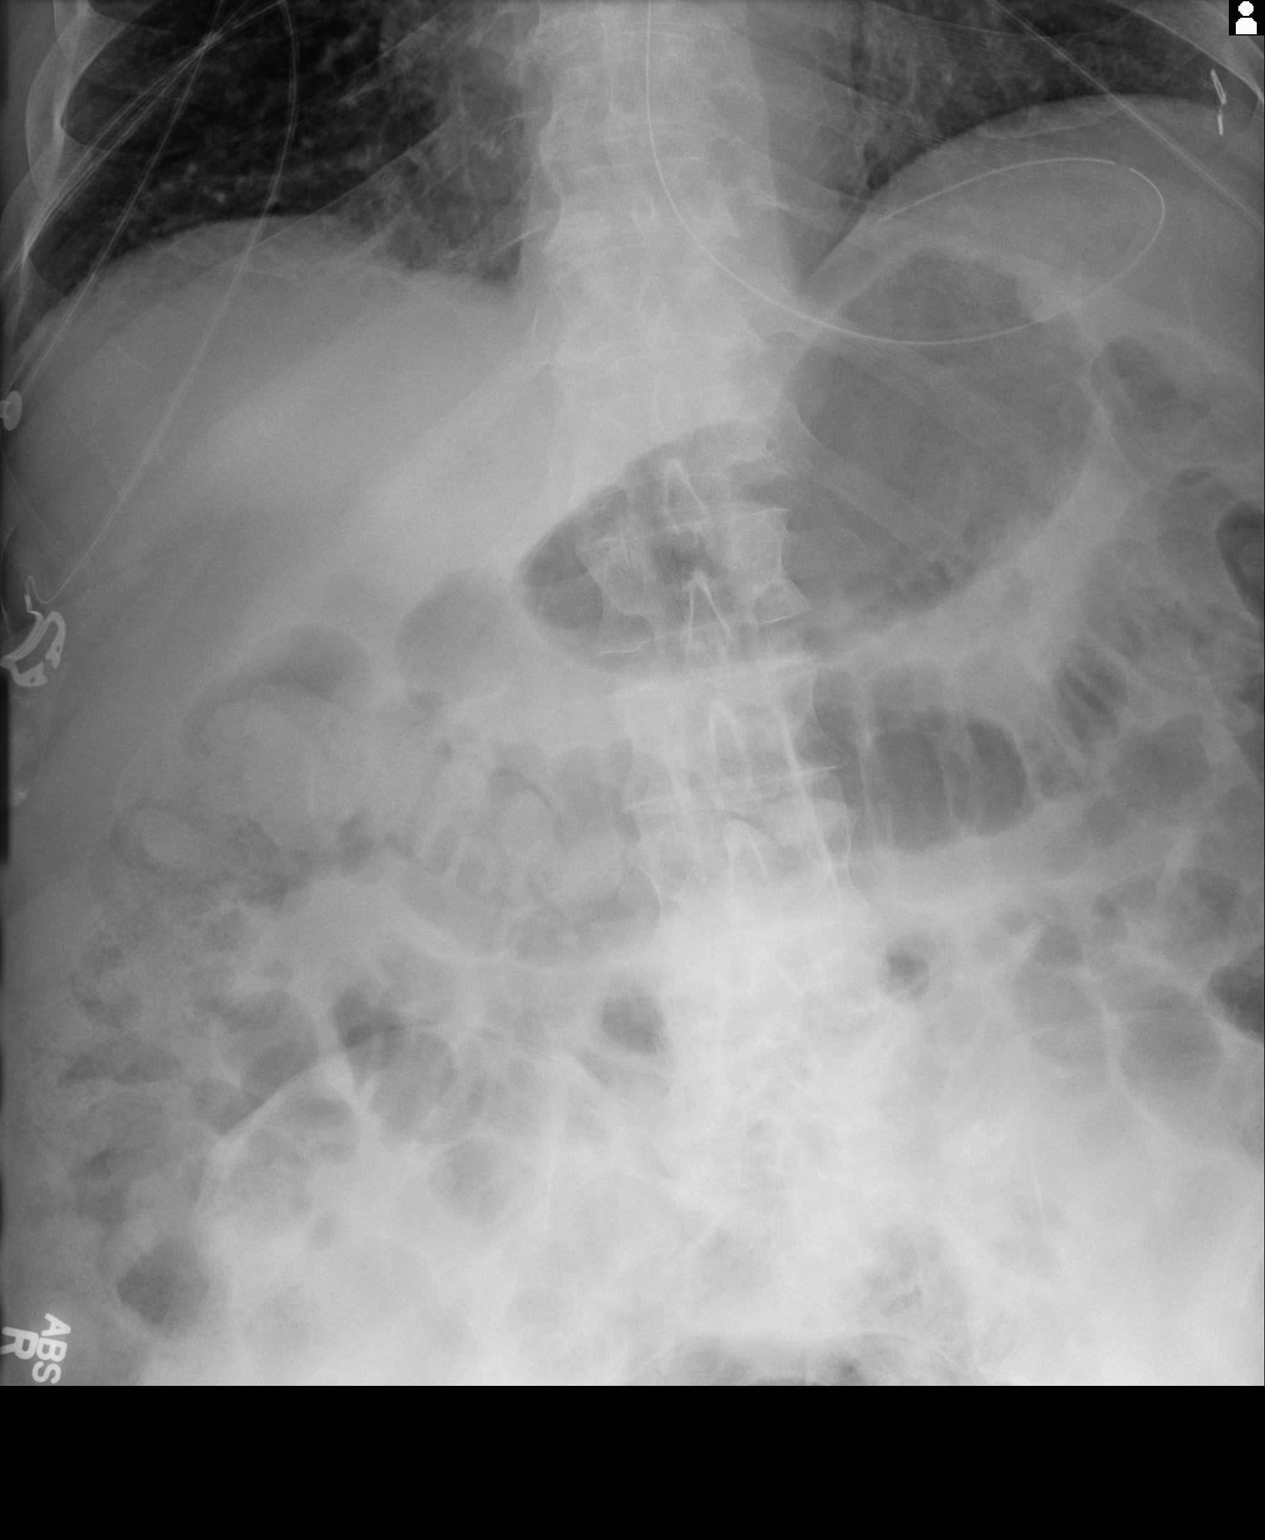

[1 of 1 positions shown; findings below may reference images not displayed]

FINDINGS: Feeding tube is coiled in the fundus of the stomach. Generalized
bowel distention. No free intraperitoneal gas.
IMPRESSION: Feeding tube coiled in the fundus of the stomach.

## 2016-07-19 IMAGING — CR DG CHEST 1V PORT
1 series · 1 of 1 positions shown · non-contrast
Comparison: 07/10/2014

CLINICAL DATA: Intubated.  Possible edema on the prior study.

EXAM:
PORTABLE CHEST - 1 VIEW

[ap]
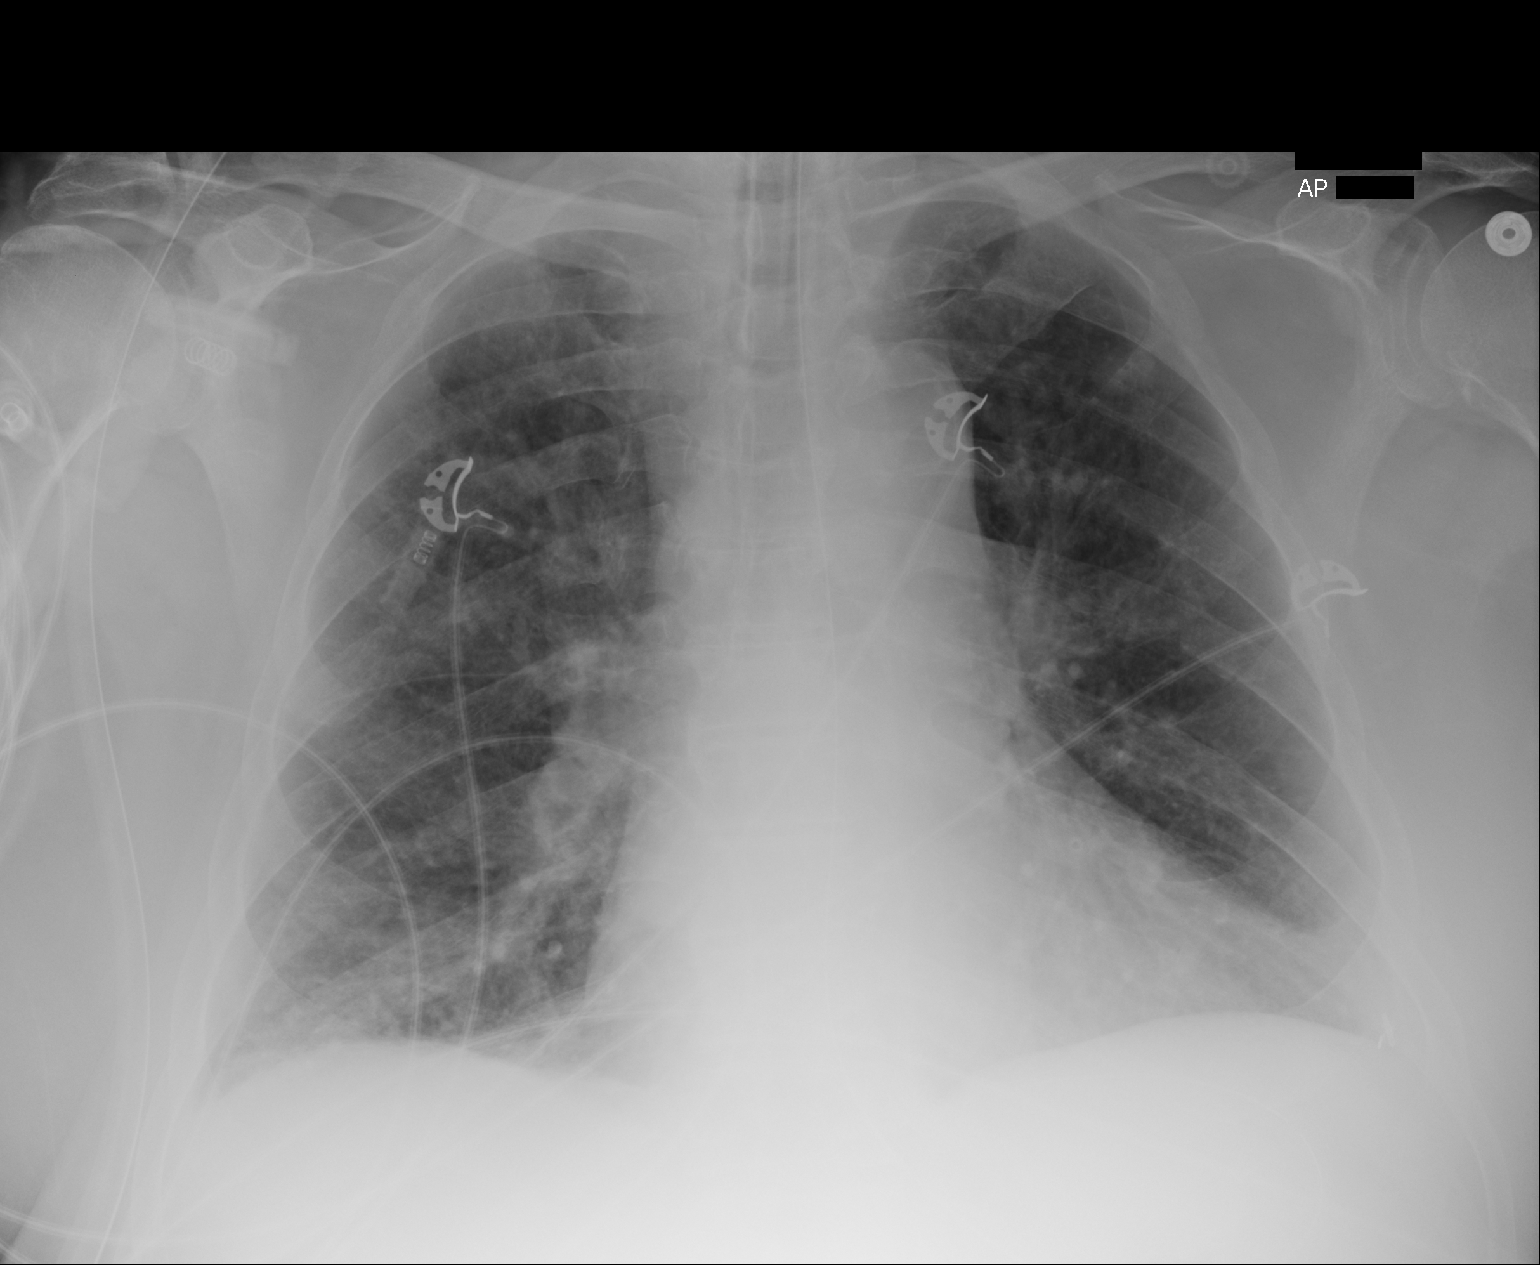

[1 of 1 positions shown; findings below may reference images not displayed]

FINDINGS: Endotracheal tube is unchanged, with tip well above the carina.
Enteric tube courses into the left upper abdomen with tip not
imaged. Cardiac silhouette remains upper limits of normal in size.
Thoracic aortic calcification is noted. Pulmonary vascular
congestion is stable. Interstitial markings remain mildly prominent
bilaterally. Slightly increased density is present in the right lung
base. No definite pleural effusion or pneumothorax is identified.
IMPRESSION: 1. Similar appearance of pulmonary vascular congestion and possible
mild interstitial edema.
2. Slightly increased right basilar opacity, likely atelectasis.

## 2016-07-22 NOTE — Telephone Encounter (Signed)
error 

## 2016-07-28 DIAGNOSIS — G4733 Obstructive sleep apnea (adult) (pediatric): Secondary | ICD-10-CM | POA: Diagnosis not present

## 2016-08-23 ENCOUNTER — Encounter: Payer: Self-pay | Admitting: Family

## 2016-08-23 ENCOUNTER — Ambulatory Visit: Payer: PPO | Attending: Family | Admitting: Family

## 2016-08-23 VITALS — BP 106/47 | HR 73 | Resp 20 | Ht 72.0 in | Wt 238.1 lb

## 2016-08-23 DIAGNOSIS — I110 Hypertensive heart disease with heart failure: Secondary | ICD-10-CM | POA: Diagnosis not present

## 2016-08-23 DIAGNOSIS — Z7902 Long term (current) use of antithrombotics/antiplatelets: Secondary | ICD-10-CM | POA: Insufficient documentation

## 2016-08-23 DIAGNOSIS — Z955 Presence of coronary angioplasty implant and graft: Secondary | ICD-10-CM | POA: Insufficient documentation

## 2016-08-23 DIAGNOSIS — I10 Essential (primary) hypertension: Secondary | ICD-10-CM

## 2016-08-23 DIAGNOSIS — I350 Nonrheumatic aortic (valve) stenosis: Secondary | ICD-10-CM | POA: Insufficient documentation

## 2016-08-23 DIAGNOSIS — I252 Old myocardial infarction: Secondary | ICD-10-CM | POA: Insufficient documentation

## 2016-08-23 DIAGNOSIS — M069 Rheumatoid arthritis, unspecified: Secondary | ICD-10-CM

## 2016-08-23 DIAGNOSIS — Z87891 Personal history of nicotine dependence: Secondary | ICD-10-CM | POA: Insufficient documentation

## 2016-08-23 DIAGNOSIS — Z8711 Personal history of peptic ulcer disease: Secondary | ICD-10-CM | POA: Diagnosis not present

## 2016-08-23 DIAGNOSIS — G4733 Obstructive sleep apnea (adult) (pediatric): Secondary | ICD-10-CM | POA: Insufficient documentation

## 2016-08-23 DIAGNOSIS — R634 Abnormal weight loss: Secondary | ICD-10-CM | POA: Insufficient documentation

## 2016-08-23 DIAGNOSIS — I2510 Atherosclerotic heart disease of native coronary artery without angina pectoris: Secondary | ICD-10-CM | POA: Insufficient documentation

## 2016-08-23 DIAGNOSIS — I5032 Chronic diastolic (congestive) heart failure: Secondary | ICD-10-CM | POA: Insufficient documentation

## 2016-08-23 DIAGNOSIS — N400 Enlarged prostate without lower urinary tract symptoms: Secondary | ICD-10-CM | POA: Insufficient documentation

## 2016-08-23 DIAGNOSIS — J449 Chronic obstructive pulmonary disease, unspecified: Secondary | ICD-10-CM | POA: Insufficient documentation

## 2016-08-23 DIAGNOSIS — E785 Hyperlipidemia, unspecified: Secondary | ICD-10-CM | POA: Insufficient documentation

## 2016-08-23 DIAGNOSIS — Z7982 Long term (current) use of aspirin: Secondary | ICD-10-CM | POA: Insufficient documentation

## 2016-08-23 DIAGNOSIS — Z9989 Dependence on other enabling machines and devices: Secondary | ICD-10-CM

## 2016-08-23 NOTE — Progress Notes (Signed)
Patient ID: Andre Hayes, male    DOB: [birth year], 76 y.o.   MRN: [id number]  HPI  Andre Hayes is a 76 y/o male with a history of obstructive sleep apnea (CPAP), RA, pneumonia, MI, HTN, hyperlipidemia, PUD, murmur, duodenal ulcer, COPD, bronchitis, BPH, aortic stenosis, remote tobacco use and chronic heart failure.  Last echo was done 08/19/14 and showed an EF of >55% with mild AR/Andre//TR/PR along with mild aortic stenosis. Had ECG stress test done 08/19/14 as well which showed normal myocardial thickening and wall motion along with homogeneous tracer distribution throughout the myocardium.  Had an endoscopy & colonoscopy done 09/18/15. Was last admitted on 09/02/15 for an acute GI bleed. Had a nuclear bleeding scan done. GI consult was obtained. Aspirin and Plavix were stopped for 3 days and then resumed. Discharged after 3 days.   He presents today for a follow-up visit with a chief complaint of mild shortness of breath with moderate exertion. He describes this as having been present for years with varying severity. He has associated light-headedness along with this.   Past Medical History:  Diagnosis Date  . Aortic stenosis   . BPH (benign prostatic hypertrophy)   . Bronchitis   . CAD (coronary artery disease)   . CHF (congestive heart failure) (HCC)   . Congestive heart failure (HCC)    NYHA 3, EF>55%  . COPD (chronic obstructive pulmonary disease) (HCC)   . Duodenal ulcer   . Edema, lower extremity   . Headache   . Heart murmur   . History of peptic ulcer disease   . Hyperlipidemia   . Hypertension   . Murmur   . Myocardial infarction   . Orthopnea   . Pneumonia 05/02/14   not hospitalized  . Pulmonary disease   . Rheumatoid arthritis (HCC)   . Senile cataracts of both eyes   . Shortness of breath dyspnea   . Sleep apnea   . Wheezing     Past Surgical History:  Procedure Laterality Date  . CATARACT EXTRACTION W/PHACO Right 06/23/2015   Procedure: CATARACT EXTRACTION PHACO  AND INTRAOCULAR LENS PLACEMENT (IOC);  Surgeon: Sallee Lange, MD;  Location: [facility] ORS;  Service: Ophthalmology;  Laterality: Right;  [place] 01:36 AP% 24.5 CDE 41.30 fluid pack lot # [id number]  . COLONOSCOPY WITH PROPOFOL N/A 09/18/2015   Procedure: COLONOSCOPY WITH PROPOFOL;  Surgeon: Christena Deem, MD;  Location: [facility] ENDOSCOPY;  Service: Endoscopy;  Laterality: N/A;  . CORONARY ANGIOPLASTY WITH STENT PLACEMENT     Dr. Juliann Pares  . ESOPHAGOGASTRODUODENOSCOPY (EGD) WITH PROPOFOL N/A 09/18/2015   Procedure: ESOPHAGOGASTRODUODENOSCOPY (EGD) WITH PROPOFOL;  Surgeon: Christena Deem, MD;  Location: [facility] ENDOSCOPY;  Service: Endoscopy;  Laterality: N/A;  . HOLEP-LASER ENUCLEATION OF THE PROSTATE WITH MORCELLATION N/A 04/14/2015   Procedure: HOLEP-LASER ENUCLEATION OF THE PROSTATE WITH MORCELLATION;  Surgeon: Vanna Scotland, MD;  Location: [facility] ORS;  Service: Urology;  Laterality: N/A;  . LUNG SURGERY      Family History  Problem Relation Age of Onset  . Tuberculosis Mother   . COPD Mother   . Emphysema Mother   . Heart disease Father   . Stomach cancer Father   . Breast cancer Sister   . Colon cancer Brother     Social History  Substance Use Topics  . Smoking status: Former Smoker    Packs/day: 1.00    Years: 10.00    Types: Cigarettes    Start date: 06/17/1954    Quit date: 06/16/1964  .  Smokeless tobacco: Never Used  . Alcohol use No    Allergies  Allergen Reactions  . Erythromycin Rash    Prior to Admission medications   Medication Sig Start Date End Date Taking? Authorizing Provider  acetaminophen (TYLENOL) 325 MG tablet Take 650 mg by mouth every 6 (six) hours as needed for mild pain or headache.    Yes [provider]  albuterol (PROVENTIL HFA;VENTOLIN HFA) 108 (90 BASE) MCG/ACT inhaler Inhale 2 puffs into the lungs every 6 (six) hours as needed for wheezing or shortness of breath.    Yes [provider]  albuterol (PROVENTIL) (2.5 MG/3ML) 0.083%  nebulizer solution Take 2.5 mg by nebulization every 6 (six) hours as needed for wheezing or shortness of breath.    Yes [provider]  aspirin EC 81 MG tablet Take 81 mg by mouth daily.    Yes [provider]  cholecalciferol (VITAMIN D) 1000 units tablet Take 1,000 Units by mouth daily.   Yes [provider]  clopidogrel (PLAVIX) 75 MG tablet Take 75 mg by mouth daily.   Yes [provider]  docusate sodium (COLACE) 100 MG capsule Take 1 capsule (100 mg total) by mouth daily. 04/15/15  Yes McGowan, Carollee Herter A, PA-C  fluticasone (FLOVENT HFA) 110 MCG/ACT inhaler Inhale 1 puff into the lungs 2 (two) times daily.   Yes [provider]  folic acid (FOLVITE) 400 MCG tablet Take 400 mcg by mouth daily.   Yes [provider]  furosemide (LASIX) 20 MG tablet Take 40 mg by mouth daily.    Yes [provider]  hydroxychloroquine (PLAQUENIL) 200 MG tablet Take 200 mg by mouth daily.   Yes [provider]  Magnesium 250 MG TABS Take 250 mg by mouth daily.   Yes [provider]  simvastatin (ZOCOR) 40 MG tablet Take 80 mg by mouth daily.    Yes [provider]  sulfaSALAzine (AZULFIDINE) 500 MG tablet Take 500 mg by mouth 2 (two) times daily.   Yes [provider]  tamsulosin (FLOMAX) 0.4 MG CAPS capsule Take 1 capsule (0.4 mg total) by mouth 2 (two) times daily. 05/20/16  Yes Vanna Scotland, MD  tiotropium (SPIRIVA) 18 MCG inhalation capsule Place 18 mcg into inhaler and inhale daily.   Yes [provider]  vitamin B-12 (CYANOCOBALAMIN) 1000 MCG tablet Take 1,000 mcg by mouth daily.   Yes [provider]   Review of Systems  Constitutional: Negative for appetite change and fatigue.  HENT: Positive for congestion (in mornings after wearing CPAP). Negative for postnasal drip and sore throat.   Eyes: Negative.   Respiratory: Positive for shortness of breath (only with moderate exertion; bending  over at the waist). Negative for cough and chest tightness.   Cardiovascular: Negative for chest pain, palpitations and leg swelling (wearing compression socks).  Gastrointestinal: Negative for abdominal distention and abdominal pain.  Endocrine: Negative.   Genitourinary: Negative.   Musculoskeletal: Positive for arthralgias (left knee/both ankle arthritis pain at times). Negative for back pain.  Skin: Negative.   Allergic/Immunologic: Negative.   Neurological: Positive for light-headedness ("at times"). Negative for dizziness.  Hematological: Negative for adenopathy. Bruises/bleeds easily.  Psychiatric/Behavioral: Positive for sleep disturbance (sleeping on 2 pillows with CPAP at night; waking up due to nocturia). Negative for dysphoric mood. The patient is not nervous/anxious.    Vitals:   08/23/16 1257  BP: (!) 106/47  Pulse: 73  Resp: 20  SpO2: 93%  Weight: 238 lb 2 oz (  108 kg)  Height: 6' (1.829 m)   Wt Readings from Last 3 Encounters:  08/23/16 238 lb 2 oz (108 kg)  02/16/16 248 lb (112.5 kg)  01/23/16 247 lb (112 kg)    Lab Results  Component Value Date   CREATININE 0.96 09/03/2015   CREATININE 0.96 09/02/2015   CREATININE 0.97 04/15/2015   Physical Exam  Constitutional: He is oriented to person, place, and time. He appears well-developed and well-nourished.  HENT:  Head: Normocephalic and atraumatic.  Neck: Normal range of motion. Neck supple.  Cardiovascular: Normal rate and regular rhythm.   Pulmonary/Chest: Effort normal. He has no wheezes. He has no rales.  Abdominal: Soft. He exhibits no distension. There is no tenderness.  Musculoskeletal: He exhibits no edema or tenderness.  Neurological: He is alert and oriented to person, place, and time.  Skin: Skin is warm and dry.  Psychiatric: He has a normal mood and affect. His behavior is normal. Thought content normal.  Nursing note and vitals reviewed.  Assessment & Plan:  1: Chronic heart failure with  preserved ejection fraction- - NYHA class II - euvolemic - continues to weigh daily and reports a gradual weight loss due to decreased caloric intake. Weight down 10 pounds since he was last here 6 months ago. Reminded to call for an overnight weight gain of >2 pounds or a weekly weight gain of >5 pounds - not adding salt to his food - last saw cardiologist Gwen Pounds) 10/21/15 - needs updated echo as last one done June 2016  2: HTN- - BP looks good today - continue medications - saw PCP Dareen Piano) on 04/06/16 - renal function checked on 04/06/16 and was normal (GFR 73)  3: Obstructive sleep apnea- - wearing CPAP nightly  - saw pulmonologist Meredeth Ide) on 06/08/16  4: Rheumatoid arthritis- - both ankles and left knee continue to bother him - last saw rheumatologist Gavin Potters) 04/30/16  Patient did not bring his medications nor a list. Each medication was verbally reviewed with the patient and he was encouraged to bring the bottles to every visit to confirm accuracy of list.  Return here in 6 months or sooner for any questions/problems before then.

## 2016-08-23 NOTE — Patient Instructions (Signed)
Continue weighing daily and call for an overnight weight gain of > 2 pounds or a weekly weight gain of >5 pounds. 

## 2016-08-28 DIAGNOSIS — G4733 Obstructive sleep apnea (adult) (pediatric): Secondary | ICD-10-CM | POA: Diagnosis not present

## 2016-09-03 ENCOUNTER — Emergency Department: Payer: PPO

## 2016-09-03 ENCOUNTER — Emergency Department
Admission: EM | Admit: 2016-09-03 | Discharge: 2016-09-03 | Disposition: A | Discharge status: Home / Self Care | Payer: PPO | Attending: Emergency Medicine | Admitting: Emergency Medicine

## 2016-09-03 DIAGNOSIS — Z87891 Personal history of nicotine dependence: Secondary | ICD-10-CM | POA: Insufficient documentation

## 2016-09-03 DIAGNOSIS — I5032 Chronic diastolic (congestive) heart failure: Secondary | ICD-10-CM | POA: Diagnosis not present

## 2016-09-03 DIAGNOSIS — Y9389 Activity, other specified: Secondary | ICD-10-CM | POA: Insufficient documentation

## 2016-09-03 DIAGNOSIS — I2510 Atherosclerotic heart disease of native coronary artery without angina pectoris: Secondary | ICD-10-CM | POA: Diagnosis not present

## 2016-09-03 DIAGNOSIS — J449 Chronic obstructive pulmonary disease, unspecified: Secondary | ICD-10-CM | POA: Diagnosis not present

## 2016-09-03 DIAGNOSIS — Z7982 Long term (current) use of aspirin: Secondary | ICD-10-CM | POA: Insufficient documentation

## 2016-09-03 DIAGNOSIS — S3992XA Unspecified injury of lower back, initial encounter: Secondary | ICD-10-CM | POA: Diagnosis not present

## 2016-09-03 DIAGNOSIS — S3991XA Unspecified injury of abdomen, initial encounter: Secondary | ICD-10-CM

## 2016-09-03 DIAGNOSIS — Z7902 Long term (current) use of antithrombotics/antiplatelets: Secondary | ICD-10-CM | POA: Insufficient documentation

## 2016-09-03 DIAGNOSIS — Y999 Unspecified external cause status: Secondary | ICD-10-CM | POA: Diagnosis not present

## 2016-09-03 DIAGNOSIS — R1033 Periumbilical pain: Secondary | ICD-10-CM | POA: Insufficient documentation

## 2016-09-03 DIAGNOSIS — M545 Low back pain: Secondary | ICD-10-CM | POA: Insufficient documentation

## 2016-09-03 DIAGNOSIS — Z79899 Other long term (current) drug therapy: Secondary | ICD-10-CM | POA: Diagnosis not present

## 2016-09-03 DIAGNOSIS — Y92410 Unspecified street and highway as the place of occurrence of the external cause: Secondary | ICD-10-CM | POA: Diagnosis not present

## 2016-09-03 DIAGNOSIS — I110 Hypertensive heart disease with heart failure: Secondary | ICD-10-CM | POA: Diagnosis not present

## 2016-09-03 MED ORDER — ORPHENADRINE CITRATE 30 MG/ML IJ SOLN
60.0000 mg | Freq: Two times a day (BID) | INTRAMUSCULAR | Status: DC
  Administered 2016-09-03: 60 mg via INTRAMUSCULAR
  Filled 2016-09-03: qty 2

## 2016-09-03 MED ORDER — CYCLOBENZAPRINE HCL 5 MG PO TABS: 5.0000 mg | ORAL_TABLET | Freq: Three times a day (TID) | ORAL | 0 refills | Status: AC | PRN
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
## 2016-09-14 DIAGNOSIS — R05 Cough: Secondary | ICD-10-CM | POA: Diagnosis not present

## 2016-09-14 DIAGNOSIS — J449 Chronic obstructive pulmonary disease, unspecified: Secondary | ICD-10-CM | POA: Diagnosis not present

## 2016-09-14 DIAGNOSIS — R0609 Other forms of dyspnea: Secondary | ICD-10-CM | POA: Diagnosis not present

## 2016-09-23 DIAGNOSIS — R0989 Other specified symptoms and signs involving the circulatory and respiratory systems: Secondary | ICD-10-CM | POA: Diagnosis not present

## 2016-09-23 DIAGNOSIS — M5489 Other dorsalgia: Secondary | ICD-10-CM | POA: Diagnosis not present

## 2016-10-04 DIAGNOSIS — I25119 Atherosclerotic heart disease of native coronary artery with unspecified angina pectoris: Secondary | ICD-10-CM | POA: Diagnosis not present

## 2016-10-04 DIAGNOSIS — I10 Essential (primary) hypertension: Secondary | ICD-10-CM | POA: Diagnosis not present

## 2016-10-04 DIAGNOSIS — M069 Rheumatoid arthritis, unspecified: Secondary | ICD-10-CM | POA: Diagnosis not present

## 2016-10-04 DIAGNOSIS — R739 Hyperglycemia, unspecified: Secondary | ICD-10-CM | POA: Diagnosis not present

## 2016-10-04 DIAGNOSIS — E669 Obesity, unspecified: Secondary | ICD-10-CM | POA: Diagnosis not present

## 2016-10-04 DIAGNOSIS — I272 Pulmonary hypertension, unspecified: Secondary | ICD-10-CM | POA: Diagnosis not present

## 2016-10-04 DIAGNOSIS — E782 Mixed hyperlipidemia: Secondary | ICD-10-CM | POA: Diagnosis not present

## 2016-10-04 DIAGNOSIS — I5032 Chronic diastolic (congestive) heart failure: Secondary | ICD-10-CM | POA: Diagnosis not present

## 2016-10-04 DIAGNOSIS — J449 Chronic obstructive pulmonary disease, unspecified: Secondary | ICD-10-CM | POA: Diagnosis not present

## 2016-10-05 DIAGNOSIS — S39012A Strain of muscle, fascia and tendon of lower back, initial encounter: Secondary | ICD-10-CM | POA: Diagnosis not present

## 2016-10-05 DIAGNOSIS — M9903 Segmental and somatic dysfunction of lumbar region: Secondary | ICD-10-CM | POA: Diagnosis not present

## 2016-10-11 DIAGNOSIS — R05 Cough: Secondary | ICD-10-CM | POA: Diagnosis not present

## 2016-11-23 DIAGNOSIS — G5712 Meralgia paresthetica, left lower limb: Secondary | ICD-10-CM | POA: Diagnosis not present

## 2016-12-29 ENCOUNTER — Other Ambulatory Visit: Payer: Self-pay | Admitting: Family Medicine

## 2016-12-29 DIAGNOSIS — M5432 Sciatica, left side: Secondary | ICD-10-CM | POA: Diagnosis not present

## 2016-12-29 DIAGNOSIS — Z23 Encounter for immunization: Secondary | ICD-10-CM | POA: Diagnosis not present

## 2017-01-01 ENCOUNTER — Ambulatory Visit
Admission: RE | Admit: 2017-01-01 | Discharge: 2017-01-01 | Disposition: A | Discharge status: Home / Self Care | Payer: PPO | Source: Ambulatory Visit | Attending: Family Medicine | Admitting: Family Medicine

## 2017-01-01 DIAGNOSIS — S32050D Wedge compression fracture of fifth lumbar vertebra, subsequent encounter for fracture with routine healing: Secondary | ICD-10-CM | POA: Diagnosis not present

## 2017-01-01 DIAGNOSIS — M48061 Spinal stenosis, lumbar region without neurogenic claudication: Secondary | ICD-10-CM | POA: Diagnosis not present

## 2017-01-01 DIAGNOSIS — M5432 Sciatica, left side: Secondary | ICD-10-CM | POA: Diagnosis not present

## 2017-01-01 DIAGNOSIS — M8938 Hypertrophy of bone, other site: Secondary | ICD-10-CM | POA: Diagnosis not present

## 2017-01-01 DIAGNOSIS — M4856XA Collapsed vertebra, not elsewhere classified, lumbar region, initial encounter for fracture: Secondary | ICD-10-CM | POA: Diagnosis not present

## 2017-01-01 DIAGNOSIS — M4606 Spinal enthesopathy, lumbar region: Secondary | ICD-10-CM | POA: Insufficient documentation

## 2017-01-01 DIAGNOSIS — S32010D Wedge compression fracture of first lumbar vertebra, subsequent encounter for fracture with routine healing: Secondary | ICD-10-CM | POA: Diagnosis not present

## 2017-01-10 DIAGNOSIS — M48062 Spinal stenosis, lumbar region with neurogenic claudication: Secondary | ICD-10-CM | POA: Diagnosis not present

## 2017-01-10 DIAGNOSIS — M5416 Radiculopathy, lumbar region: Secondary | ICD-10-CM | POA: Diagnosis not present

## 2017-01-10 DIAGNOSIS — M5136 Other intervertebral disc degeneration, lumbar region: Secondary | ICD-10-CM | POA: Diagnosis not present

## 2017-01-10 DIAGNOSIS — M7062 Trochanteric bursitis, left hip: Secondary | ICD-10-CM | POA: Diagnosis not present

## 2017-01-13 DIAGNOSIS — M7632 Iliotibial band syndrome, left leg: Secondary | ICD-10-CM | POA: Diagnosis not present

## 2017-01-13 DIAGNOSIS — M25562 Pain in left knee: Secondary | ICD-10-CM | POA: Diagnosis not present

## 2017-01-13 DIAGNOSIS — M5410 Radiculopathy, site unspecified: Secondary | ICD-10-CM | POA: Diagnosis not present

## 2017-01-19 DIAGNOSIS — M0579 Rheumatoid arthritis with rheumatoid factor of multiple sites without organ or systems involvement: Secondary | ICD-10-CM | POA: Diagnosis not present

## 2017-01-19 DIAGNOSIS — M25562 Pain in left knee: Secondary | ICD-10-CM | POA: Diagnosis not present

## 2017-01-19 DIAGNOSIS — J449 Chronic obstructive pulmonary disease, unspecified: Secondary | ICD-10-CM | POA: Diagnosis not present

## 2017-01-19 DIAGNOSIS — G8929 Other chronic pain: Secondary | ICD-10-CM | POA: Diagnosis not present

## 2017-01-26 ENCOUNTER — Ambulatory Visit: Payer: Medicare HMO | Admitting: Urology

## 2017-01-28 DIAGNOSIS — R1032 Left lower quadrant pain: Secondary | ICD-10-CM | POA: Diagnosis not present

## 2017-01-28 DIAGNOSIS — K5900 Constipation, unspecified: Secondary | ICD-10-CM | POA: Diagnosis not present

## 2017-02-07 DIAGNOSIS — R05 Cough: Secondary | ICD-10-CM | POA: Diagnosis not present

## 2017-02-07 DIAGNOSIS — R0609 Other forms of dyspnea: Secondary | ICD-10-CM | POA: Diagnosis not present

## 2017-02-07 DIAGNOSIS — J449 Chronic obstructive pulmonary disease, unspecified: Secondary | ICD-10-CM | POA: Diagnosis not present

## 2017-02-07 DIAGNOSIS — G4733 Obstructive sleep apnea (adult) (pediatric): Secondary | ICD-10-CM | POA: Diagnosis not present

## 2017-02-08 ENCOUNTER — Ambulatory Visit: Payer: Medicare HMO | Admitting: Urology

## 2017-02-23 ENCOUNTER — Ambulatory Visit: Payer: PPO | Admitting: Family

## 2017-02-27 DIAGNOSIS — G4733 Obstructive sleep apnea (adult) (pediatric): Secondary | ICD-10-CM | POA: Diagnosis not present

## 2017-03-09 DIAGNOSIS — J449 Chronic obstructive pulmonary disease, unspecified: Secondary | ICD-10-CM | POA: Diagnosis not present

## 2017-03-09 DIAGNOSIS — R05 Cough: Secondary | ICD-10-CM | POA: Diagnosis not present

## 2017-03-10 ENCOUNTER — Ambulatory Visit: Payer: PPO | Admitting: Family

## 2017-03-10 ENCOUNTER — Telehealth: Payer: Self-pay | Admitting: Family

## 2017-03-10 NOTE — Telephone Encounter (Signed)
Patient did not show for his [facility] appointment on 03/10/17. Will attempt to reschedule.  

## 2017-03-10 NOTE — Progress Notes (Deleted)
Patient ID: Andre Hayes, male    DOB: 1940-10-24, 76 y.o.   MRN: [id number]  HPI  Andre Hayes is a 75 y/o male with a history of obstructive sleep apnea (CPAP), RA, pneumonia, MI, HTN, hyperlipidemia, PUD, murmur, duodenal ulcer, COPD, bronchitis, BPH, aortic stenosis, remote tobacco use and chronic heart failure.  Last echo was done 08/19/14 and showed an EF of >55% with mild AR/Andre//TR/PR along with mild aortic stenosis. Had ECG stress test done 08/19/14 as well which showed normal myocardial thickening and wall motion along with homogeneous tracer distribution throughout the myocardium.  Had an endoscopy & colonoscopy done 09/18/15. Was last admitted on 09/02/15 for an acute GI bleed. Had a nuclear bleeding scan done. GI consult was obtained. Aspirin and Plavix were stopped for 3 days and then resumed. Discharged after 3 days.   He presents today for a follow-up visit with a chief complaint of mild shortness of breath with moderate exertion. He describes this as having been present for years with varying severity. He has associated light-headedness along with this.   Past Medical History:  Diagnosis Date  . Aortic stenosis   . BPH (benign prostatic hypertrophy)   . Bronchitis   . CAD (coronary artery disease)   . CHF (congestive heart failure) (HCC)   . Congestive heart failure (HCC)    NYHA 3, EF>55%  . COPD (chronic obstructive pulmonary disease) (HCC)   . Duodenal ulcer   . Edema, lower extremity   . Headache   . Heart murmur   . History of peptic ulcer disease   . Hyperlipidemia   . Hypertension   . Murmur   . Myocardial infarction (HCC)   . Orthopnea   . Pneumonia 05/02/14   not hospitalized  . Pulmonary disease   . Rheumatoid arthritis (HCC)   . Senile cataracts of both eyes   . Shortness of breath dyspnea   . Sleep apnea   . Wheezing     Past Surgical History:  Procedure Laterality Date  . CATARACT EXTRACTION W/PHACO Right 06/23/2015   Procedure: CATARACT EXTRACTION  PHACO AND INTRAOCULAR LENS PLACEMENT (IOC);  Surgeon: Sallee Lange, MD;  Location: [facility] ORS;  Service: Ophthalmology;  Laterality: Right;  [place] 01:36 AP% 24.5 CDE 41.30 fluid pack lot # [id number]  . COLONOSCOPY WITH PROPOFOL N/A 09/18/2015   Procedure: COLONOSCOPY WITH PROPOFOL;  Surgeon: Christena Deem, MD;  Location[facility] ENDOSCOPY;  Service: Endoscopy;  Laterality: N/A;  . CORONARY ANGIOPLASTY WITH STENT PLACEMENT     Dr. Juliann Pares  . ESOPHAGOGASTRODUODENOSCOPY (EGD) WITH PROPOFOL N/A 09/18/2015   Procedure: ESOPHAGOGASTRODUODENOSCOPY (EGD) WITH PROPOFOL;  Surgeon: Christena Deem, MD;  Location: [facility] ENDOSCOPY;  Service: Endoscopy;  Laterality: N/A;  . HOLEP-LASER ENUCLEATION OF THE PROSTATE WITH MORCELLATION N/A 04/14/2015   Procedure: HOLEP-LASER ENUCLEATION OF THE PROSTATE WITH MORCELLATION;  Surgeon: Vanna Scotland, MD;  Location: [facility] ORS;  Service: Urology;  Laterality: N/A;  . LUNG SURGERY      Family History  Problem Relation Age of Onset  . Tuberculosis Mother   . COPD Mother   . Emphysema Mother   . Heart disease Father   . Stomach cancer Father   . Breast cancer Sister   . Colon cancer Brother     Social History   Tobacco Use  . Smoking status: Former Smoker    Packs/day: 1.00    Years: 10.00    Pack years: 10.00    Types: Cigarettes    Start date: 06/17/1954  Last attempt to quit: 06/16/1964    Years since quitting: 52.7  . Smokeless tobacco: Never Used  Substance Use Topics  . Alcohol use: No    Alcohol/week: 0.0 oz    Allergies  Allergen Reactions  . Erythromycin Rash     Review of Systems  Constitutional: Negative for appetite change and fatigue.  HENT: Positive for congestion (in mornings after wearing CPAP). Negative for postnasal drip and sore throat.   Eyes: Negative.   Respiratory: Positive for shortness of breath (only with moderate exertion; bending over at the waist). Negative for cough and chest tightness.   Cardiovascular: Negative for  chest pain, palpitations and leg swelling (wearing compression socks).  Gastrointestinal: Negative for abdominal distention and abdominal pain.  Endocrine: Negative.   Genitourinary: Negative.   Musculoskeletal: Positive for arthralgias (left knee/both ankle arthritis pain at times). Negative for back pain.  Skin: Negative.   Allergic/Immunologic: Negative.   Neurological: Positive for light-headedness ("at times"). Negative for dizziness.  Hematological: Negative for adenopathy. Bruises/bleeds easily.  Psychiatric/Behavioral: Positive for sleep disturbance (sleeping on 2 pillows with CPAP at night; waking up due to nocturia). Negative for dysphoric mood. The patient is not nervous/anxious.      Lab Results  Component Value Date   CREATININE 0.96 09/03/2015   CREATININE 0.96 09/02/2015   CREATININE 0.97 04/15/2015   Physical Exam  Constitutional: He is oriented to person, place, and time. He appears well-developed and well-nourished.  HENT:  Head: Normocephalic and atraumatic.  Neck: Normal range of motion. Neck supple.  Cardiovascular: Normal rate and regular rhythm.  Pulmonary/Chest: Effort normal. He has no wheezes. He has no rales.  Abdominal: Soft. He exhibits no distension. There is no tenderness.  Musculoskeletal: He exhibits no edema or tenderness.  Neurological: He is alert and oriented to person, place, and time.  Skin: Skin is warm and dry.  Psychiatric: He has a normal mood and affect. His behavior is normal. Thought content normal.  Nursing note and vitals reviewed.  Assessment & Plan:  1: Chronic heart failure with preserved ejection fraction- - NYHA class II - euvolemic - continues to weigh daily and reports a gradual weight loss due to decreased caloric intake. Weight down 10 pounds since he was last here 6 months ago. Reminded to call for an overnight weight gain of >2 pounds or a weekly weight gain of >5 pounds - not adding salt to his food - last saw  cardiologist Gwen Pounds) 10/21/15 - needs updated echo as last one done June 2016  2: HTN- - BP looks good today - continue medications - saw PCP Dareen Piano) on 04/06/16 - renal function checked on 04/06/16 and was normal (GFR 73)  3: Obstructive sleep apnea- - wearing CPAP nightly  - saw pulmonologist Meredeth Ide) on 06/08/16  4: Rheumatoid arthritis- - both ankles and left knee continue to bother him - last saw rheumatologist Gavin Potters) 04/30/16  Patient did not bring his medications nor a list. Each medication was verbally reviewed with the patient and he was encouraged to bring the bottles to every visit to confirm accuracy of list.  Return here in 6 months or sooner for any questions/problems before then.

## 2017-03-28 ENCOUNTER — Other Ambulatory Visit: Payer: Self-pay | Admitting: Urology

## 2017-03-28 DIAGNOSIS — N401 Enlarged prostate with lower urinary tract symptoms: Secondary | ICD-10-CM

## 2017-03-30 DIAGNOSIS — G4733 Obstructive sleep apnea (adult) (pediatric): Secondary | ICD-10-CM | POA: Diagnosis not present
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
